# Patient Record
Sex: Female | Born: 2000
Health system: Southern US, Community
[De-identification: ages and names within clinical notes are randomized; demographics above are authoritative.]

## PROBLEM LIST (undated history)

## (undated) DIAGNOSIS — F32A Depression, unspecified: Secondary | ICD-10-CM

## (undated) DIAGNOSIS — F419 Anxiety disorder, unspecified: Secondary | ICD-10-CM

## (undated) DIAGNOSIS — F938 Other childhood emotional disorders: Secondary | ICD-10-CM

## (undated) DIAGNOSIS — R45851 Suicidal ideations: Secondary | ICD-10-CM

## (undated) DIAGNOSIS — F329 Major depressive disorder, single episode, unspecified: Secondary | ICD-10-CM

## (undated) HISTORY — PX: NO PAST SURGERIES: SHX2092

---
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
## 2018-05-09 DIAGNOSIS — F339 Major depressive disorder, recurrent, unspecified: Secondary | ICD-10-CM | POA: Diagnosis not present

## 2018-05-09 DIAGNOSIS — F1020 Alcohol dependence, uncomplicated: Secondary | ICD-10-CM | POA: Diagnosis not present

## 2018-05-12 DIAGNOSIS — M545 Low back pain: Secondary | ICD-10-CM | POA: Diagnosis not present

## 2018-05-12 DIAGNOSIS — M542 Cervicalgia: Secondary | ICD-10-CM | POA: Diagnosis not present

## 2018-05-12 DIAGNOSIS — M62830 Muscle spasm of back: Secondary | ICD-10-CM | POA: Diagnosis not present

## 2018-05-12 DIAGNOSIS — M546 Pain in thoracic spine: Secondary | ICD-10-CM | POA: Diagnosis not present

## 2018-05-15 DIAGNOSIS — M545 Low back pain: Secondary | ICD-10-CM | POA: Diagnosis not present

## 2018-05-15 DIAGNOSIS — M546 Pain in thoracic spine: Secondary | ICD-10-CM | POA: Diagnosis not present

## 2018-05-15 DIAGNOSIS — M542 Cervicalgia: Secondary | ICD-10-CM | POA: Diagnosis not present

## 2018-05-20 DIAGNOSIS — R293 Abnormal posture: Secondary | ICD-10-CM | POA: Diagnosis not present

## 2018-05-20 DIAGNOSIS — M25652 Stiffness of left hip, not elsewhere classified: Secondary | ICD-10-CM | POA: Diagnosis not present

## 2018-05-20 DIAGNOSIS — M62830 Muscle spasm of back: Secondary | ICD-10-CM | POA: Diagnosis not present

## 2018-05-20 DIAGNOSIS — M6281 Muscle weakness (generalized): Secondary | ICD-10-CM | POA: Diagnosis not present

## 2018-05-20 DIAGNOSIS — M25651 Stiffness of right hip, not elsewhere classified: Secondary | ICD-10-CM | POA: Diagnosis not present

## 2018-05-21 DIAGNOSIS — F332 Major depressive disorder, recurrent severe without psychotic features: Secondary | ICD-10-CM | POA: Diagnosis not present

## 2018-05-21 DIAGNOSIS — F411 Generalized anxiety disorder: Secondary | ICD-10-CM | POA: Diagnosis not present

## 2018-05-21 DIAGNOSIS — M6281 Muscle weakness (generalized): Secondary | ICD-10-CM | POA: Diagnosis not present

## 2018-05-21 DIAGNOSIS — M9901 Segmental and somatic dysfunction of cervical region: Secondary | ICD-10-CM | POA: Diagnosis not present

## 2018-05-21 DIAGNOSIS — R293 Abnormal posture: Secondary | ICD-10-CM | POA: Diagnosis not present

## 2018-05-21 DIAGNOSIS — M9903 Segmental and somatic dysfunction of lumbar region: Secondary | ICD-10-CM | POA: Diagnosis not present

## 2018-05-21 DIAGNOSIS — M25652 Stiffness of left hip, not elsewhere classified: Secondary | ICD-10-CM | POA: Diagnosis not present

## 2018-05-21 DIAGNOSIS — F4312 Post-traumatic stress disorder, chronic: Secondary | ICD-10-CM | POA: Diagnosis not present

## 2018-05-21 DIAGNOSIS — M25651 Stiffness of right hip, not elsewhere classified: Secondary | ICD-10-CM | POA: Diagnosis not present

## 2018-05-21 DIAGNOSIS — M9906 Segmental and somatic dysfunction of lower extremity: Secondary | ICD-10-CM | POA: Diagnosis not present

## 2018-05-21 DIAGNOSIS — M9902 Segmental and somatic dysfunction of thoracic region: Secondary | ICD-10-CM | POA: Diagnosis not present

## 2018-05-21 DIAGNOSIS — M9905 Segmental and somatic dysfunction of pelvic region: Secondary | ICD-10-CM | POA: Diagnosis not present

## 2018-05-25 DIAGNOSIS — R293 Abnormal posture: Secondary | ICD-10-CM | POA: Diagnosis not present

## 2018-05-25 DIAGNOSIS — M25651 Stiffness of right hip, not elsewhere classified: Secondary | ICD-10-CM | POA: Diagnosis not present

## 2018-05-25 DIAGNOSIS — M25652 Stiffness of left hip, not elsewhere classified: Secondary | ICD-10-CM | POA: Diagnosis not present

## 2018-05-25 DIAGNOSIS — M6281 Muscle weakness (generalized): Secondary | ICD-10-CM | POA: Diagnosis not present

## 2018-05-27 DIAGNOSIS — F332 Major depressive disorder, recurrent severe without psychotic features: Secondary | ICD-10-CM | POA: Diagnosis not present

## 2018-05-27 DIAGNOSIS — F4312 Post-traumatic stress disorder, chronic: Secondary | ICD-10-CM | POA: Diagnosis not present

## 2018-05-27 DIAGNOSIS — F411 Generalized anxiety disorder: Secondary | ICD-10-CM | POA: Diagnosis not present

## 2018-05-28 DIAGNOSIS — M9906 Segmental and somatic dysfunction of lower extremity: Secondary | ICD-10-CM | POA: Diagnosis not present

## 2018-05-28 DIAGNOSIS — F411 Generalized anxiety disorder: Secondary | ICD-10-CM | POA: Diagnosis not present

## 2018-05-28 DIAGNOSIS — R293 Abnormal posture: Secondary | ICD-10-CM | POA: Diagnosis not present

## 2018-05-28 DIAGNOSIS — M6281 Muscle weakness (generalized): Secondary | ICD-10-CM | POA: Diagnosis not present

## 2018-05-28 DIAGNOSIS — M9901 Segmental and somatic dysfunction of cervical region: Secondary | ICD-10-CM | POA: Diagnosis not present

## 2018-05-28 DIAGNOSIS — F332 Major depressive disorder, recurrent severe without psychotic features: Secondary | ICD-10-CM | POA: Diagnosis not present

## 2018-05-28 DIAGNOSIS — M9903 Segmental and somatic dysfunction of lumbar region: Secondary | ICD-10-CM | POA: Diagnosis not present

## 2018-05-28 DIAGNOSIS — M9902 Segmental and somatic dysfunction of thoracic region: Secondary | ICD-10-CM | POA: Diagnosis not present

## 2018-05-28 DIAGNOSIS — M9905 Segmental and somatic dysfunction of pelvic region: Secondary | ICD-10-CM | POA: Diagnosis not present

## 2018-05-28 DIAGNOSIS — M25652 Stiffness of left hip, not elsewhere classified: Secondary | ICD-10-CM | POA: Diagnosis not present

## 2018-05-28 DIAGNOSIS — M25651 Stiffness of right hip, not elsewhere classified: Secondary | ICD-10-CM | POA: Diagnosis not present

## 2018-05-28 DIAGNOSIS — F4312 Post-traumatic stress disorder, chronic: Secondary | ICD-10-CM | POA: Diagnosis not present

## 2018-06-01 DIAGNOSIS — R293 Abnormal posture: Secondary | ICD-10-CM | POA: Diagnosis not present

## 2018-06-01 DIAGNOSIS — M542 Cervicalgia: Secondary | ICD-10-CM | POA: Diagnosis not present

## 2018-06-01 DIAGNOSIS — M6281 Muscle weakness (generalized): Secondary | ICD-10-CM | POA: Diagnosis not present

## 2018-06-01 DIAGNOSIS — M25651 Stiffness of right hip, not elsewhere classified: Secondary | ICD-10-CM | POA: Diagnosis not present

## 2018-06-01 DIAGNOSIS — M25652 Stiffness of left hip, not elsewhere classified: Secondary | ICD-10-CM | POA: Diagnosis not present

## 2018-06-03 DIAGNOSIS — F4312 Post-traumatic stress disorder, chronic: Secondary | ICD-10-CM | POA: Diagnosis not present

## 2018-06-03 DIAGNOSIS — F332 Major depressive disorder, recurrent severe without psychotic features: Secondary | ICD-10-CM | POA: Diagnosis not present

## 2018-06-03 DIAGNOSIS — F411 Generalized anxiety disorder: Secondary | ICD-10-CM | POA: Diagnosis not present

## 2018-06-04 DIAGNOSIS — F4312 Post-traumatic stress disorder, chronic: Secondary | ICD-10-CM | POA: Diagnosis not present

## 2018-06-04 DIAGNOSIS — F332 Major depressive disorder, recurrent severe without psychotic features: Secondary | ICD-10-CM | POA: Diagnosis not present

## 2018-06-04 DIAGNOSIS — F411 Generalized anxiety disorder: Secondary | ICD-10-CM | POA: Diagnosis not present

## 2018-06-08 DIAGNOSIS — M25651 Stiffness of right hip, not elsewhere classified: Secondary | ICD-10-CM | POA: Diagnosis not present

## 2018-06-08 DIAGNOSIS — M6281 Muscle weakness (generalized): Secondary | ICD-10-CM | POA: Diagnosis not present

## 2018-06-08 DIAGNOSIS — R293 Abnormal posture: Secondary | ICD-10-CM | POA: Diagnosis not present

## 2018-06-08 DIAGNOSIS — M542 Cervicalgia: Secondary | ICD-10-CM | POA: Diagnosis not present

## 2018-06-08 DIAGNOSIS — M25652 Stiffness of left hip, not elsewhere classified: Secondary | ICD-10-CM | POA: Diagnosis not present

## 2018-06-09 DIAGNOSIS — F4312 Post-traumatic stress disorder, chronic: Secondary | ICD-10-CM | POA: Diagnosis not present

## 2018-06-09 DIAGNOSIS — F411 Generalized anxiety disorder: Secondary | ICD-10-CM | POA: Diagnosis not present

## 2018-06-09 DIAGNOSIS — F332 Major depressive disorder, recurrent severe without psychotic features: Secondary | ICD-10-CM | POA: Diagnosis not present

## 2018-06-10 DIAGNOSIS — F411 Generalized anxiety disorder: Secondary | ICD-10-CM | POA: Diagnosis not present

## 2018-06-10 DIAGNOSIS — F332 Major depressive disorder, recurrent severe without psychotic features: Secondary | ICD-10-CM | POA: Diagnosis not present

## 2018-06-10 DIAGNOSIS — F4312 Post-traumatic stress disorder, chronic: Secondary | ICD-10-CM | POA: Diagnosis not present

## 2018-06-11 DIAGNOSIS — M25651 Stiffness of right hip, not elsewhere classified: Secondary | ICD-10-CM | POA: Diagnosis not present

## 2018-06-11 DIAGNOSIS — M9905 Segmental and somatic dysfunction of pelvic region: Secondary | ICD-10-CM | POA: Diagnosis not present

## 2018-06-11 DIAGNOSIS — M9906 Segmental and somatic dysfunction of lower extremity: Secondary | ICD-10-CM | POA: Diagnosis not present

## 2018-06-11 DIAGNOSIS — M9903 Segmental and somatic dysfunction of lumbar region: Secondary | ICD-10-CM | POA: Diagnosis not present

## 2018-06-11 DIAGNOSIS — M9902 Segmental and somatic dysfunction of thoracic region: Secondary | ICD-10-CM | POA: Diagnosis not present

## 2018-06-11 DIAGNOSIS — M25652 Stiffness of left hip, not elsewhere classified: Secondary | ICD-10-CM | POA: Diagnosis not present

## 2018-06-11 DIAGNOSIS — M9901 Segmental and somatic dysfunction of cervical region: Secondary | ICD-10-CM | POA: Diagnosis not present

## 2018-06-17 DIAGNOSIS — F332 Major depressive disorder, recurrent severe without psychotic features: Secondary | ICD-10-CM | POA: Diagnosis not present

## 2018-06-17 DIAGNOSIS — F4312 Post-traumatic stress disorder, chronic: Secondary | ICD-10-CM | POA: Diagnosis not present

## 2018-06-17 DIAGNOSIS — F411 Generalized anxiety disorder: Secondary | ICD-10-CM | POA: Diagnosis not present

## 2018-06-18 DIAGNOSIS — F411 Generalized anxiety disorder: Secondary | ICD-10-CM | POA: Diagnosis not present

## 2018-06-18 DIAGNOSIS — F332 Major depressive disorder, recurrent severe without psychotic features: Secondary | ICD-10-CM | POA: Diagnosis not present

## 2018-06-18 DIAGNOSIS — F4312 Post-traumatic stress disorder, chronic: Secondary | ICD-10-CM | POA: Diagnosis not present

## 2018-06-22 DIAGNOSIS — F332 Major depressive disorder, recurrent severe without psychotic features: Secondary | ICD-10-CM | POA: Diagnosis not present

## 2018-06-22 DIAGNOSIS — F4312 Post-traumatic stress disorder, chronic: Secondary | ICD-10-CM | POA: Diagnosis not present

## 2018-06-22 DIAGNOSIS — F411 Generalized anxiety disorder: Secondary | ICD-10-CM | POA: Diagnosis not present

## 2018-06-23 DIAGNOSIS — F4312 Post-traumatic stress disorder, chronic: Secondary | ICD-10-CM | POA: Diagnosis not present

## 2018-06-23 DIAGNOSIS — F411 Generalized anxiety disorder: Secondary | ICD-10-CM | POA: Diagnosis not present

## 2018-06-23 DIAGNOSIS — F332 Major depressive disorder, recurrent severe without psychotic features: Secondary | ICD-10-CM | POA: Diagnosis not present

## 2018-06-24 DIAGNOSIS — F411 Generalized anxiety disorder: Secondary | ICD-10-CM | POA: Diagnosis not present

## 2018-06-24 DIAGNOSIS — F332 Major depressive disorder, recurrent severe without psychotic features: Secondary | ICD-10-CM | POA: Diagnosis not present

## 2018-06-24 DIAGNOSIS — F4312 Post-traumatic stress disorder, chronic: Secondary | ICD-10-CM | POA: Diagnosis not present

## 2018-06-25 DIAGNOSIS — M542 Cervicalgia: Secondary | ICD-10-CM | POA: Diagnosis not present

## 2018-06-25 DIAGNOSIS — M25512 Pain in left shoulder: Secondary | ICD-10-CM | POA: Diagnosis not present

## 2018-06-25 DIAGNOSIS — M25511 Pain in right shoulder: Secondary | ICD-10-CM | POA: Diagnosis not present

## 2018-06-30 DIAGNOSIS — F332 Major depressive disorder, recurrent severe without psychotic features: Secondary | ICD-10-CM | POA: Diagnosis not present

## 2018-06-30 DIAGNOSIS — F411 Generalized anxiety disorder: Secondary | ICD-10-CM | POA: Diagnosis not present

## 2018-06-30 DIAGNOSIS — F4312 Post-traumatic stress disorder, chronic: Secondary | ICD-10-CM | POA: Diagnosis not present

## 2018-07-01 DIAGNOSIS — M25512 Pain in left shoulder: Secondary | ICD-10-CM | POA: Diagnosis not present

## 2018-07-01 DIAGNOSIS — M542 Cervicalgia: Secondary | ICD-10-CM | POA: Diagnosis not present

## 2018-07-01 DIAGNOSIS — M25511 Pain in right shoulder: Secondary | ICD-10-CM | POA: Diagnosis not present

## 2018-07-02 DIAGNOSIS — F332 Major depressive disorder, recurrent severe without psychotic features: Secondary | ICD-10-CM | POA: Diagnosis not present

## 2018-07-02 DIAGNOSIS — F4312 Post-traumatic stress disorder, chronic: Secondary | ICD-10-CM | POA: Diagnosis not present

## 2018-07-02 DIAGNOSIS — F411 Generalized anxiety disorder: Secondary | ICD-10-CM | POA: Diagnosis not present

## 2018-08-27 DIAGNOSIS — Z20828 Contact with and (suspected) exposure to other viral communicable diseases: Secondary | ICD-10-CM | POA: Diagnosis not present

## 2018-10-22 ENCOUNTER — Telehealth: Payer: Self-pay

## 2018-10-22 NOTE — Telephone Encounter (Signed)

## 2018-10-23 ENCOUNTER — Other Ambulatory Visit: Payer: Self-pay

## 2018-10-23 ENCOUNTER — Encounter: Payer: Self-pay | Admitting: Family Medicine

## 2018-10-23 ENCOUNTER — Ambulatory Visit (INDEPENDENT_AMBULATORY_CARE_PROVIDER_SITE_OTHER): Payer: BC Managed Care – PPO | Admitting: Family Medicine

## 2018-10-23 VITALS — BP 112/60 | HR 115 | Temp 98.4°F | Ht 66.5 in | Wt 110.4 lb

## 2018-10-23 DIAGNOSIS — Z23 Encounter for immunization: Secondary | ICD-10-CM

## 2018-10-23 DIAGNOSIS — Z0000 Encounter for general adult medical examination without abnormal findings: Secondary | ICD-10-CM

## 2018-10-23 NOTE — Progress Notes (Signed)
Molly Callahan is a 18 y.o. female  Chief Complaint  Patient presents with  . New Patient (Initial Visit)    Pt is here today to establish Care. She is not currently fasting.  . Annual Exam    Pt is requesting a CPE. According to [facility] she is due for her Menveo#2, HPV#2, and Flu vaccines which she agrees to receive.    HPI: Molly Callahan is a 18 y.o. female here as a new patient to our office for her annual CPE. Pt is not fasting. She would like to get flu vaccine, as well as HPV #2, and menveo #2 vaccines today. Pt works as a Educational psychologist.  Pt has a PMHx significant for anxiety, depression for which she takes wellbutrin and lexapro. Thomasene Lot at [facility] prescribes pts meds and sees her every 2 mo.   Diet/Exercise: overall healthy, some exercise Dental: UTD Vision: does not wear glasses or contacts and no concerns about vision  Med refills needed today? no   Past Medical History:  Diagnosis Date  . Anxiety   . 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 Depression   . Suicidal ideation 03/19/2016    Past Surgical History:  Procedure Laterality Date  . NO PAST SURGERIES      Social History   Socioeconomic History  . Marital status: Single    Spouse name: Not on file  . Number of children: Not on file  . Years of education: Not on file  . Highest education level: Not on file  Occupational History  . Not on file  Social Needs  . Financial resource strain: Not on file  . Food insecurity    Worry: Not on file    Inability: Not on file  . Transportation needs    Medical: Not on file    Non-medical: Not on file  Tobacco Use  . Smoking status: Never Smoker  . Smokeless tobacco: Never Used  Substance and Sexual Activity  . Alcohol use: No  . Drug use: No  . Sexual activity: Yes    Birth control/protection: Condom  Lifestyle  . Physical activity    Days per week: Not on file    Minutes per session: Not on file  . Stress: Not on file   Relationships  . Social Herbalist on phone: Not on file    Gets together: Not on file    Attends religious service: Not on file    Active member of club or organization: Not on file    Attends meetings of clubs or organizations: Not on file    Relationship status: Not on file  . Intimate partner violence    Fear of current or ex partner: Not on file    Emotionally abused: Not on file    Physically abused: Not on file    Forced sexual activity: Not on file  Other Topics Concern  . Not on file  Social History Narrative  . Not on file    History reviewed. No pertinent family history.   Immunization History  Administered Date(s) Administered  . DTaP 02/13/1999, 05/01/1999, 08/29/1999, 08/08/2000  . Hepatitis A 01/11/2009, 06/08/2010  . Hepatitis B 02/13/1999, 05/01/1999, 08/29/1999  . HiB (PRP-OMP) 02/13/1999, 05/01/1999, 08/29/1999, 08/08/2000  . Hpv 04/20/2014  . IPV 02/13/1999, 05/01/1999, 03/14/2000, 05/18/2004  . Influenza Nasal 01/11/2008, 01/03/2009, 11/14/2012  . Influenza,inj,Quad PF,6+ Mos 10/23/2016  . Influenza-Unspecified 03/07/2016  . MMR 03/14/2000, 05/18/2004  . Meningococcal Conjugate 06/08/2010  .  Pneumococcal Conjugate-13 08/08/2000  . Tdap 06/08/2010  . Varicella 08/08/2000, 01/11/2009    Outpatient Encounter Medications as of 10/23/2018  Medication Sig  . buPROPion (WELLBUTRIN XL) 300 MG 24 hr tablet Take 300 mg by mouth daily.  Marland Kitchen escitalopram (LEXAPRO) 20 MG tablet Take 1 tablet (20 mg total) by mouth at bedtime.  Marland Kitchen ibuprofen (ADVIL,MOTRIN) 200 MG tablet Take 400 mg by mouth every 6 (six) hours as needed for headache.  Marland Kitchen MELATONIN PO Take 6 mg by mouth at bedtime as needed.  . [DISCONTINUED] buPROPion (WELLBUTRIN XL) 150 MG 24 hr tablet Take 150 mg by mouth daily.  . [DISCONTINUED] escitalopram (LEXAPRO) 10 MG tablet Take 10 mg by mouth daily.  . [DISCONTINUED] nitrofurantoin, macrocrystal-monohydrate, (MACROBID) 100 MG capsule Take 1  capsule (100 mg total) by mouth every 12 (twelve) hours. (Patient not taking: Reported on 06/10/2017)   No facility-administered encounter medications on file as of 10/23/2018.      ROS: Gen: no fever, chills  Skin: no rash, itching ENT: no ear pain, ear drainage, nasal congestion, rhinorrhea, sinus pressure, sore throat Eyes: no blurry vision, double vision Resp: no cough, wheeze,SOB Breast: no breast tenderness, no nipple discharge, no breast masses CV: no CP, palpitations, LE edema,  GI: no heartburn, n/v/d/c, abd pain GU: no dysuria, urgency, frequency, hematuria; no vaginal itching, odor, discharge MSK: no joint pain, myalgias, back pain Neuro: no dizziness, headache, weakness, vertigo Psych: + depression, anxiety, some insomnia   Allergies  Allergen Reactions  . Pollen Extract Other (See Comments)    Teary eyes    BP 112/60 (BP Location: Right Arm, Patient Position: Sitting, Cuff Size: Normal)   Pulse (!) 115   Temp 98.4 F (36.9 C) (Oral)   Ht 5' 6.5" (1.689 m)   Wt 110 lb 6.4 oz (50.1 kg)   LMP 10/16/2018   SpO2 98%   BMI 17.55 kg/m   Physical Exam  Constitutional: She is oriented to person, place, and time. She appears well-developed and well-nourished. No distress.  HENT:  Head: Normocephalic and atraumatic.  Right Ear: Tympanic membrane and ear canal normal.  Left Ear: Tympanic membrane and ear canal normal.  Nose: Nose normal.  Mouth/Throat: Oropharynx is clear and moist and mucous membranes are normal.  Eyes: Pupils are equal, round, and reactive to light. Conjunctivae are normal.  Neck: Neck supple. No thyromegaly present.  Cardiovascular: Normal rate, regular rhythm, normal heart sounds and intact distal pulses.  No murmur heard. Pulmonary/Chest: Effort normal and breath sounds normal. No respiratory distress. She has no wheezes. She has no rhonchi.  Abdominal: Soft. Bowel sounds are normal. She exhibits no distension and no mass. There is no abdominal  tenderness.  Musculoskeletal:        General: No edema.  Lymphadenopathy:    She has no cervical adenopathy.  Neurological: She is alert and oriented to person, place, and time. She exhibits normal muscle tone. Coordination normal.  Skin: Skin is warm and dry.  Psychiatric: She has a normal mood and affect. Her behavior is normal.     A/P:  1. Annual physical exam - immunizations today and then UTD (see below) - discussed importance of regular CV exercise, healthy diet, adequate sleep, avoidance of tobacco, alcohol, and illicit drugs - UTD on dental exam, pt does not wear glasses or contact and does not have any concerns about her vision - PAP and routine labs at 19yo - next CPE in 1 year  2. Need for influenza vaccination -  Flu Vaccine QUAD 6+ mos PF IM (Fluarix Quad PF)  3. Need for meningitis vaccination - Meningococcal MCV4O(Menveo)  4. Need for HPV vaccine - HPV 9-valent vaccine,Recombinat - will RTO for nurse visit in 12 wks for #3

## 2018-10-23 NOTE — Patient Instructions (Signed)
Preventive Care 18-21 Years Old, Female Preventive care refers to lifestyle choices and visits with your health care provider that can promote health and wellness. At this stage in your life, you may start seeing a primary care physician instead of a pediatrician. Your health care is now your responsibility. Preventive care for young adults includes:  A yearly physical exam. This is also called an annual wellness visit.  Regular dental and eye exams.  Immunizations.  Screening for certain conditions.  Healthy lifestyle choices, such as diet and exercise. What can I expect for my preventive care visit? Physical exam Your health care provider may check:  Height and weight. These may be used to calculate body mass index (BMI), which is a measurement that tells if you are at a healthy weight.  Heart rate and blood pressure.  Body temperature. Counseling Your health care provider may ask you questions about:  Past medical problems and family medical history.  Alcohol, tobacco, and drug use.  Home and relationship well-being.  Access to firearms.  Emotional well-being.  Diet, exercise, and sleep habits.  Sexual activity and sexual health.  Method of birth control.  Menstrual cycle.  Pregnancy history. What immunizations do I need?  Influenza (flu) vaccine  This is recommended every year. Tetanus, diphtheria, and pertussis (Tdap) vaccine  You may need a Td booster every 10 years. Varicella (chickenpox) vaccine  You may need this vaccine if you have not already been vaccinated. Human papillomavirus (HPV) vaccine  If recommended by your health care provider, you may need three doses over 6 months. Measles, mumps, and rubella (MMR) vaccine  You may need at least one dose of MMR. You may also need a second dose. Meningococcal conjugate (MenACWY) vaccine  One dose is recommended if you are 19-21 years old and a first-year college student living in a residence hall,  or if you have one of several medical conditions. You may also need additional booster doses. Pneumococcal conjugate (PCV13) vaccine  You may need this if you have certain conditions and were not previously vaccinated. Pneumococcal polysaccharide (PPSV23) vaccine  You may need one or two doses if you smoke cigarettes or if you have certain conditions. Hepatitis A vaccine  You may need this if you have certain conditions or if you travel or work in places where you may be exposed to hepatitis A. Hepatitis B vaccine  You may need this if you have certain conditions or if you travel or work in places where you may be exposed to hepatitis B. Haemophilus influenzae type b (Hib) vaccine  You may need this if you have certain risk factors. You may receive vaccines as individual doses or as more than one vaccine together in one shot (combination vaccines). Talk with your health care provider about the risks and benefits of combination vaccines. What tests do I need? Blood tests  Lipid and cholesterol levels. These may be checked every 5 years starting at age 20.  Hepatitis C test.  Hepatitis B test. Screening  Pelvic exam and Pap test. This may be done every 3 years starting at age 21.  Sexually transmitted disease (STD) testing, if you are at risk.  BRCA-related cancer screening. This may be done if you have a family history of breast, ovarian, tubal, or peritoneal cancers. Other tests  Tuberculosis skin test.  Vision and hearing tests.  Skin exam.  Breast exam. Follow these instructions at home: Eating and drinking   Eat a diet that includes fresh fruits and   vegetables, whole grains, lean protein, and low-fat dairy products.  Drink enough fluid to keep your urine pale yellow.  Do not drink alcohol if: ? Your health care provider tells you not to drink. ? You are pregnant, may be pregnant, or are planning to become pregnant. ? You are under the legal drinking age. In the  [place] the legal drinking age is 21.  If you drink alcohol: ? Limit how much you have to 0-1 drink a day. ? Be aware of how much alcohol is in your drink. In the [place] one drink equals one 12 oz bottle of beer (355 mL), one 5 oz glass of wine (148 mL), or one 1 oz glass of hard liquor (44 mL). Lifestyle  Take daily care of your teeth and gums.  Stay active. Exercise at least 30 minutes 5 or more days of the week.  Do not use any products that contain nicotine or tobacco, such as cigarettes, e-cigarettes, and chewing tobacco. If you need help quitting, ask your health care provider.  Do not use drugs.  If you are sexually active, practice safe sex. Use a condom or other form of birth control (contraception) in order to prevent pregnancy and STIs (sexually transmitted infections). If you plan to become pregnant, see your health care provider for a pre-conception visit.  Find healthy ways to cope with stress, such as: ? Meditation, yoga, or listening to music. ? Journaling. ? Talking to a trusted person. ? Spending time with friends and family. Safety  Always wear your seat belt while driving or riding in a vehicle.  Do not drive if you have been drinking alcohol. Do not ride with someone who has been drinking.  Do not drive when you are tired or distracted. Do not text while driving.  Wear a helmet and other protective equipment during sports activities.  If you have firearms in your house, make sure you follow all gun safety procedures.  Seek help if you have been bullied, physically abused, or sexually abused.  Use the Internet responsibly to avoid dangers such as online bullying and online sex predators. What's next?  Go to your health care provider once a year for a well check visit.  Ask your health care provider how often you should have your eyes and teeth checked.  Stay up to date on all vaccines. This information is not intended to replace advice given to you by  your health care provider. Make sure you discuss any questions you have with your health care provider. Document Released: 05/25/2015 Document Revised: 01/01/2018 Document Reviewed: 01/01/2018 Elsevier Patient Education  2020 [employer] Inc[employer]  

## 2018-12-11 DIAGNOSIS — F331 Major depressive disorder, recurrent, moderate: Secondary | ICD-10-CM | POA: Diagnosis not present

## 2018-12-11 DIAGNOSIS — F419 Anxiety disorder, unspecified: Secondary | ICD-10-CM | POA: Diagnosis not present

## 2018-12-11 DIAGNOSIS — F332 Major depressive disorder, recurrent severe without psychotic features: Secondary | ICD-10-CM | POA: Diagnosis not present

## 2018-12-24 ENCOUNTER — Ambulatory Visit (INDEPENDENT_AMBULATORY_CARE_PROVIDER_SITE_OTHER): Payer: BC Managed Care – PPO | Admitting: Family Medicine

## 2018-12-24 ENCOUNTER — Encounter: Payer: Self-pay | Admitting: Family Medicine

## 2018-12-24 ENCOUNTER — Other Ambulatory Visit: Payer: Self-pay

## 2018-12-24 DIAGNOSIS — F332 Major depressive disorder, recurrent severe without psychotic features: Secondary | ICD-10-CM | POA: Diagnosis not present

## 2018-12-24 DIAGNOSIS — F419 Anxiety disorder, unspecified: Secondary | ICD-10-CM | POA: Diagnosis not present

## 2018-12-24 MED ORDER — ESCITALOPRAM OXALATE 20 MG PO TABS: 20.0000 mg | ORAL_TABLET | Freq: Every day | ORAL | 3 refills | Status: DC

## 2018-12-24 MED ORDER — BUPROPION HCL ER (XL) 300 MG PO TB24: 300.0000 mg | ORAL_TABLET | Freq: Every day | ORAL | 3 refills | Status: DC

## 2018-12-24 NOTE — Progress Notes (Signed)
Virtual Visit via Video Note  I connected with Molly Callahan on 12/24/18 at 10:30 AM EST by a video enabled telemedicine application and verified that I am speaking with the correct person using two identifiers. Location patient: home Location provider: work  Persons participating in the virtual visit: patient, provider  I discussed the limitations of evaluation and management by telemedicine and the availability of in person appointments. The patient expressed understanding and agreed to proceed.  Chief Complaint  Patient presents with  . Medication Refill     HPI: Molly Callahan is a 18 y.o. female who is requesting refills of her meds - lexapro 20mg  daily and wellbutrin XL 300mg  daily. She has been on these meds x 5 years. She feels they are effective. Denies side effects. She feels good overall. No issues on concerns.   Depression screen [facility] 2/9 12/24/2018 10/23/2018  Decreased Interest 0 1  Down, Depressed, Hopeless 1 1  PHQ - 2 Score 1 2  Altered sleeping - 0  Tired, decreased energy - 1  Change in appetite - 1  Feeling bad or failure about yourself  - 1  Trouble concentrating - 1  Moving slowly or fidgety/restless - 0  Suicidal thoughts - 0  PHQ-9 Score - 6  Difficult doing work/chores - Somewhat difficult   GAD 7 : Generalized Anxiety Score 12/24/2018 10/23/2018  Nervous, Anxious, on Edge 0 0  Control/stop worrying 0 0  Worry too much - different things 0 0  Trouble relaxing 1 1  Restless 0 0  Easily annoyed or irritable 1 1  Afraid - awful might happen 0 0  Total GAD 7 Score 2 2  Anxiety Difficulty - Not difficult at all      Past Medical History:  Diagnosis Date  . Anxiety   . 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 Depression   . Suicidal ideation 03/19/2016    Past Surgical History:  Procedure Laterality Date  . NO PAST SURGERIES      No family history on file.  Social History   Tobacco Use  . Smoking status: Never Smoker  . Smokeless  tobacco: Never Used  Substance Use Topics  . Alcohol use: No  . Drug use: No     Current Outpatient Medications:  .  buPROPion (WELLBUTRIN XL) 300 MG 24 hr tablet, Take 1 tablet (300 mg total) by mouth daily., Disp: 90 tablet, Rfl: 3 .  escitalopram (LEXAPRO) 20 MG tablet, Take 1 tablet (20 mg total) by mouth at bedtime., Disp: 90 tablet, Rfl: 3 .  ibuprofen (ADVIL,MOTRIN) 200 MG tablet, Take 400 mg by mouth every 6 (six) hours as needed for headache., Disp: , Rfl:  .  MELATONIN PO, Take 6 mg by mouth at bedtime as needed., Disp: , Rfl:   Allergies  Allergen Reactions  . Pollen Extract Other (See Comments)    Teary eyes      ROS: See pertinent positives and negatives per HPI.   EXAM:  VITALS per patient if applicable: LMP 12/08/2018    GENERAL: alert, oriented, appears well and in no acute distress  HEENT: atraumatic, conjunctiva clear, no obvious abnormalities on inspection of external nose and ears  NECK: normal movements of the head and neck  LUNGS: on inspection no signs of respiratory distress, breathing rate appears normal, no obvious gross SOB, gasping or wheezing, no conversational dyspnea  CV: no obvious cyanosis  PSYCH/NEURO: pleasant and cooperative, no obvious depression or anxiety, speech and thought processing  grossly intact   ASSESSMENT AND PLAN: 1. Major depressive disorder, recurrent severe without psychotic features ([place]) 2. Anxiety - chronic, stable, well-controlled - PHQ-9 = 2, GAD-7 = 2 Refill: - buPROPion (WELLBUTRIN XL) 300 MG 24 hr tablet - escitalopram (LEXAPRO) 20 MG tablet - f/u in 6-12 mo or sooner PRN   I discussed the assessment and treatment plan with the patient. The patient was provided an opportunity to ask questions and all were answered. The patient agreed with the plan and demonstrated an understanding of the instructions.   The patient was advised to call back or seek an in-person evaluation if the symptoms worsen or if the  condition fails to improve as anticipated.   Letta Median, DO

## 2019-01-26 ENCOUNTER — Ambulatory Visit: Payer: BC Managed Care – PPO

## 2019-02-17 ENCOUNTER — Inpatient Hospital Stay (HOSPITAL_COMMUNITY)
Admission: EM | Admit: 2019-02-17 | Discharge: 2019-02-26 | DRG: 917 | Disposition: A | Discharge status: Other Acute Inpatient Hospital | Payer: BC Managed Care – PPO | Attending: Internal Medicine | Admitting: Internal Medicine

## 2019-02-17 ENCOUNTER — Other Ambulatory Visit: Payer: Self-pay

## 2019-02-17 ENCOUNTER — Emergency Department (HOSPITAL_COMMUNITY): Payer: BC Managed Care – PPO

## 2019-02-17 DIAGNOSIS — T43292A Poisoning by other antidepressants, intentional self-harm, initial encounter: Principal | ICD-10-CM | POA: Diagnosis present

## 2019-02-17 DIAGNOSIS — I472 Ventricular tachycardia: Secondary | ICD-10-CM | POA: Diagnosis not present

## 2019-02-17 DIAGNOSIS — E874 Mixed disorder of acid-base balance: Secondary | ICD-10-CM | POA: Diagnosis not present

## 2019-02-17 DIAGNOSIS — I471 Supraventricular tachycardia: Secondary | ICD-10-CM | POA: Diagnosis not present

## 2019-02-17 DIAGNOSIS — F419 Anxiety disorder, unspecified: Secondary | ICD-10-CM | POA: Diagnosis not present

## 2019-02-17 DIAGNOSIS — G253 Myoclonus: Secondary | ICD-10-CM | POA: Diagnosis not present

## 2019-02-17 DIAGNOSIS — R069 Unspecified abnormalities of breathing: Secondary | ICD-10-CM

## 2019-02-17 DIAGNOSIS — R404 Transient alteration of awareness: Secondary | ICD-10-CM | POA: Diagnosis not present

## 2019-02-17 DIAGNOSIS — Z978 Presence of other specified devices: Secondary | ICD-10-CM

## 2019-02-17 DIAGNOSIS — Z4682 Encounter for fitting and adjustment of non-vascular catheter: Secondary | ICD-10-CM | POA: Diagnosis not present

## 2019-02-17 DIAGNOSIS — T50902A Poisoning by unspecified drugs, medicaments and biological substances, intentional self-harm, initial encounter: Secondary | ICD-10-CM | POA: Diagnosis not present

## 2019-02-17 DIAGNOSIS — I4581 Long QT syndrome: Secondary | ICD-10-CM | POA: Diagnosis present

## 2019-02-17 DIAGNOSIS — T43222A Poisoning by selective serotonin reuptake inhibitors, intentional self-harm, initial encounter: Secondary | ICD-10-CM | POA: Diagnosis not present

## 2019-02-17 DIAGNOSIS — F515 Nightmare disorder: Secondary | ICD-10-CM | POA: Diagnosis not present

## 2019-02-17 DIAGNOSIS — I468 Cardiac arrest due to other underlying condition: Secondary | ICD-10-CM | POA: Diagnosis not present

## 2019-02-17 DIAGNOSIS — F4310 Post-traumatic stress disorder, unspecified: Secondary | ICD-10-CM | POA: Diagnosis present

## 2019-02-17 DIAGNOSIS — T50901A Poisoning by unspecified drugs, medicaments and biological substances, accidental (unintentional), initial encounter: Secondary | ICD-10-CM | POA: Diagnosis present

## 2019-02-17 DIAGNOSIS — Z781 Physical restraint status: Secondary | ICD-10-CM

## 2019-02-17 DIAGNOSIS — R9431 Abnormal electrocardiogram [ECG] [EKG]: Secondary | ICD-10-CM | POA: Diagnosis not present

## 2019-02-17 DIAGNOSIS — T1491XA Suicide attempt, initial encounter: Secondary | ICD-10-CM

## 2019-02-17 DIAGNOSIS — Z20822 Contact with and (suspected) exposure to covid-19: Secondary | ICD-10-CM | POA: Diagnosis not present

## 2019-02-17 DIAGNOSIS — J9811 Atelectasis: Secondary | ICD-10-CM | POA: Diagnosis not present

## 2019-02-17 DIAGNOSIS — Z452 Encounter for adjustment and management of vascular access device: Secondary | ICD-10-CM | POA: Diagnosis not present

## 2019-02-17 DIAGNOSIS — T50902D Poisoning by unspecified drugs, medicaments and biological substances, intentional self-harm, subsequent encounter: Secondary | ICD-10-CM | POA: Diagnosis not present

## 2019-02-17 DIAGNOSIS — I462 Cardiac arrest due to underlying cardiac condition: Secondary | ICD-10-CM | POA: Diagnosis not present

## 2019-02-17 DIAGNOSIS — G40409 Other generalized epilepsy and epileptic syndromes, not intractable, without status epilepticus: Secondary | ICD-10-CM | POA: Diagnosis not present

## 2019-02-17 DIAGNOSIS — Z79899 Other long term (current) drug therapy: Secondary | ICD-10-CM | POA: Diagnosis not present

## 2019-02-17 DIAGNOSIS — R52 Pain, unspecified: Secondary | ICD-10-CM | POA: Diagnosis not present

## 2019-02-17 DIAGNOSIS — Z6820 Body mass index (BMI) 20.0-20.9, adult: Secondary | ICD-10-CM

## 2019-02-17 DIAGNOSIS — R918 Other nonspecific abnormal finding of lung field: Secondary | ICD-10-CM | POA: Diagnosis not present

## 2019-02-17 DIAGNOSIS — E876 Hypokalemia: Secondary | ICD-10-CM | POA: Diagnosis not present

## 2019-02-17 DIAGNOSIS — E440 Moderate protein-calorie malnutrition: Secondary | ICD-10-CM | POA: Diagnosis not present

## 2019-02-17 DIAGNOSIS — G92 Toxic encephalopathy: Secondary | ICD-10-CM | POA: Diagnosis not present

## 2019-02-17 DIAGNOSIS — Z915 Personal history of self-harm: Secondary | ICD-10-CM

## 2019-02-17 DIAGNOSIS — F17210 Nicotine dependence, cigarettes, uncomplicated: Secondary | ICD-10-CM | POA: Diagnosis not present

## 2019-02-17 DIAGNOSIS — Z9911 Dependence on respirator [ventilator] status: Secondary | ICD-10-CM

## 2019-02-17 DIAGNOSIS — R519 Headache, unspecified: Secondary | ICD-10-CM | POA: Diagnosis not present

## 2019-02-17 DIAGNOSIS — N179 Acute kidney failure, unspecified: Secondary | ICD-10-CM | POA: Diagnosis present

## 2019-02-17 DIAGNOSIS — G9340 Encephalopathy, unspecified: Secondary | ICD-10-CM

## 2019-02-17 DIAGNOSIS — J690 Pneumonitis due to inhalation of food and vomit: Secondary | ICD-10-CM | POA: Diagnosis not present

## 2019-02-17 DIAGNOSIS — J9600 Acute respiratory failure, unspecified whether with hypoxia or hypercapnia: Secondary | ICD-10-CM | POA: Diagnosis not present

## 2019-02-17 DIAGNOSIS — R45851 Suicidal ideations: Secondary | ICD-10-CM | POA: Diagnosis not present

## 2019-02-17 DIAGNOSIS — R42 Dizziness and giddiness: Secondary | ICD-10-CM | POA: Diagnosis not present

## 2019-02-17 DIAGNOSIS — I959 Hypotension, unspecified: Secondary | ICD-10-CM | POA: Diagnosis not present

## 2019-02-17 DIAGNOSIS — R569 Unspecified convulsions: Secondary | ICD-10-CM | POA: Diagnosis present

## 2019-02-17 DIAGNOSIS — G40901 Epilepsy, unspecified, not intractable, with status epilepticus: Secondary | ICD-10-CM | POA: Diagnosis not present

## 2019-02-17 DIAGNOSIS — J9601 Acute respiratory failure with hypoxia: Secondary | ICD-10-CM | POA: Diagnosis not present

## 2019-02-17 DIAGNOSIS — R031 Nonspecific low blood-pressure reading: Secondary | ICD-10-CM | POA: Diagnosis not present

## 2019-02-17 DIAGNOSIS — F332 Major depressive disorder, recurrent severe without psychotic features: Secondary | ICD-10-CM | POA: Diagnosis not present

## 2019-02-17 DIAGNOSIS — D6489 Other specified anemias: Secondary | ICD-10-CM | POA: Diagnosis not present

## 2019-02-17 DIAGNOSIS — G2579 Other drug induced movement disorders: Secondary | ICD-10-CM | POA: Diagnosis not present

## 2019-02-17 DIAGNOSIS — U071 COVID-19: Secondary | ICD-10-CM | POA: Diagnosis not present

## 2019-02-17 LAB — CBC WITH DIFFERENTIAL/PLATELET
Abs Immature Granulocytes: 0.04 10*3/uL (ref 0.00–0.07)
Basophils Absolute: 0.1 10*3/uL (ref 0.0–0.1)
Basophils Relative: 1 %
Eosinophils Absolute: 0.2 10*3/uL (ref 0.0–0.5)
Eosinophils Relative: 2 %
HCT: 38.8 % (ref 36.0–46.0)
Hemoglobin: 12.7 g/dL (ref 12.0–15.0)
Immature Granulocytes: 0 %
Lymphocytes Relative: 21 %
Lymphs Abs: 1.9 10*3/uL (ref 0.7–4.0)
MCH: 29.5 pg (ref 26.0–34.0)
MCHC: 32.7 g/dL (ref 30.0–36.0)
MCV: 90 fL (ref 80.0–100.0)
Monocytes Absolute: 0.7 10*3/uL (ref 0.1–1.0)
Monocytes Relative: 7 %
Neutro Abs: 6.4 10*3/uL (ref 1.7–7.7)
Neutrophils Relative %: 69 %
Platelets: 229 10*3/uL (ref 150–400)
RBC: 4.31 MIL/uL (ref 3.87–5.11)
RDW: 12.8 % (ref 11.5–15.5)
WBC: 9.2 10*3/uL (ref 4.0–10.5)
nRBC: 0 % (ref 0.0–0.2)

## 2019-02-17 LAB — COMPREHENSIVE METABOLIC PANEL
ALT: 14 U/L (ref 0–44)
AST: 17 U/L (ref 15–41)
Albumin: 4.1 g/dL (ref 3.5–5.0)
Alkaline Phosphatase: 47 U/L (ref 38–126)
Anion gap: 14 (ref 5–15)
BUN: 7 mg/dL (ref 6–20)
CO2: 18 mmol/L — ABNORMAL LOW (ref 22–32)
Calcium: 8.5 mg/dL — ABNORMAL LOW (ref 8.9–10.3)
Chloride: 109 mmol/L (ref 98–111)
Creatinine, Ser: 0.83 mg/dL (ref 0.44–1.00)
GFR calc Af Amer: >60 mL/min (ref >60)
GFR calc non Af Amer: >60 mL/min (ref >60)
Glucose, Bld: 101 mg/dL — ABNORMAL HIGH (ref 70–99)
Potassium: 3.9 mmol/L (ref 3.5–5.1)
Sodium: 141 mmol/L (ref 135–145)
Total Bilirubin: 1.3 mg/dL — ABNORMAL HIGH (ref 0.3–1.2)
Total Protein: 6.9 g/dL (ref 6.5–8.1)

## 2019-02-17 LAB — URINALYSIS, ROUTINE W REFLEX MICROSCOPIC
Bilirubin Urine: NEGATIVE
Glucose, UA: NEGATIVE mg/dL
Hgb urine dipstick: NEGATIVE
Ketones, ur: NEGATIVE mg/dL
Leukocytes,Ua: NEGATIVE
Nitrite: NEGATIVE
Protein, ur: 100 mg/dL — AB
Specific Gravity, Urine: 1.011 (ref 1.005–1.030)
pH: 5 (ref 5.0–8.0)

## 2019-02-17 LAB — RESPIRATORY PANEL BY RT PCR (FLU A&B, COVID)
Influenza A by PCR: NEGATIVE
Influenza B by PCR: NEGATIVE
SARS Coronavirus 2 by RT PCR: NEGATIVE

## 2019-02-17 LAB — RAPID URINE DRUG SCREEN, HOSP PERFORMED
Amphetamines: NOT DETECTED
Barbiturates: NOT DETECTED
Benzodiazepines: POSITIVE — AB
Cocaine: NOT DETECTED
Opiates: NOT DETECTED
Tetrahydrocannabinol: POSITIVE — AB

## 2019-02-17 LAB — ETHANOL: Alcohol, Ethyl (B): <10 mg/dL (ref <10)

## 2019-02-17 LAB — ACETAMINOPHEN LEVEL: Acetaminophen (Tylenol), Serum: <10 ug/mL — ABNORMAL LOW (ref 10–30)

## 2019-02-17 LAB — SALICYLATE LEVEL: Salicylate Lvl: <7 mg/dL — ABNORMAL LOW (ref 7.0–30.0)

## 2019-02-17 MED ORDER — ETOMIDATE 2 MG/ML IV SOLN
15.0000 mg | Freq: Once | INTRAVENOUS | Status: DC
  Filled 2019-02-17: qty 10

## 2019-02-17 MED ORDER — FENTANYL CITRATE (PF) 100 MCG/2ML IJ SOLN: 50.0000 ug | INTRAMUSCULAR | Status: DC | PRN

## 2019-02-17 MED ORDER — LORAZEPAM 2 MG/ML IJ SOLN
INTRAMUSCULAR | Status: AC
  Filled 2019-02-17: qty 1

## 2019-02-17 MED ORDER — MIDAZOLAM 50MG/50ML (1MG/ML) PREMIX INFUSION
2.0000 mg/h | INTRAVENOUS | Status: DC
  Administered 2019-02-17: 2 mg/h via INTRAVENOUS
  Filled 2019-02-17: qty 50

## 2019-02-17 MED ORDER — CHARCOAL ACTIVATED PO LIQD
50.0000 g | Freq: Once | ORAL | Status: AC
  Administered 2019-02-17: 50 g
  Filled 2019-02-17: qty 240

## 2019-02-17 MED ORDER — LORAZEPAM 2 MG/ML IJ SOLN
1.0000 mg | Freq: Once | INTRAMUSCULAR | Status: AC
  Administered 2019-02-17: 1 mg via INTRAVENOUS
  Filled 2019-02-17: qty 1

## 2019-02-17 MED ORDER — MIDAZOLAM BOLUS VIA INFUSION
1.0000 mg | INTRAVENOUS | Status: DC | PRN
  Administered 2019-02-18: 2 mg via INTRAVENOUS
  Filled 2019-02-17: qty 2

## 2019-02-17 MED ORDER — ROCURONIUM BROMIDE 50 MG/5ML IV SOLN
INTRAVENOUS | Status: AC | PRN
  Administered 2019-02-17: 50 mg via INTRAVENOUS

## 2019-02-17 MED ORDER — ROCURONIUM BROMIDE 50 MG/5ML IV SOLN
50.0000 mg | Freq: Once | INTRAVENOUS | Status: DC
  Filled 2019-02-17: qty 5

## 2019-02-17 MED ORDER — MIDAZOLAM 50MG/50ML (1MG/ML) PREMIX INFUSION
0.0000 mg/h | INTRAVENOUS | Status: DC
  Administered 2019-02-18: 5 mg/h via INTRAVENOUS
  Filled 2019-02-17: qty 50

## 2019-02-17 MED ORDER — MAGNESIUM SULFATE 2 GM/50ML IV SOLN
2.0000 g | Freq: Once | INTRAVENOUS | Status: AC
  Administered 2019-02-17: 2 g via INTRAVENOUS
  Filled 2019-02-17: qty 50

## 2019-02-17 MED ORDER — FENTANYL CITRATE (PF) 100 MCG/2ML IJ SOLN
25.0000 ug | INTRAMUSCULAR | Status: DC | PRN
  Administered 2019-02-17: 50 ug via INTRAVENOUS
  Filled 2019-02-17: qty 2

## 2019-02-17 MED ORDER — LACTATED RINGERS IV SOLN: INTRAVENOUS | Status: DC

## 2019-02-17 MED ORDER — FENTANYL CITRATE (PF) 100 MCG/2ML IJ SOLN
50.0000 ug | INTRAMUSCULAR | Status: DC | PRN
  Administered 2019-02-18: 50 ug via INTRAVENOUS

## 2019-02-17 MED ORDER — ETOMIDATE 2 MG/ML IV SOLN
INTRAVENOUS | Status: AC | PRN
  Administered 2019-02-17: 20 mg via INTRAVENOUS

## 2019-02-17 MED ORDER — ENOXAPARIN SODIUM 40 MG/0.4ML ~~LOC~~ SOLN
40.0000 mg | SUBCUTANEOUS | Status: DC
  Filled 2019-02-17: qty 0.4

## 2019-02-17 NOTE — ED Provider Notes (Signed)
[facility]-EMERGENCY DEPT Provider Note   CSN: [id number] Arrival date & time: 02/17/19  2054     History Chief Complaint  Patient presents with  . Drug Overdose    Molly Callahan is a 19 y.o. female.  Presents to ER after intentional overdose.  EMS reports ingestion around 7:00pm or 7:30pm.  Estimated 35-45 tablets of Lexapro 20 mg and 35 to 45 tablets Wellbutrin XL 300 mg.  History limited due to altered mental status.  Additional history obtained later from family, they checked on patient around 730pm this evening when they noticed patient to be complaining of headache, dizziness and nausea.  Unsure when she exactly took pills.  HPI     Past Medical History:  Diagnosis Date  . Anxiety   . 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 Depression   . Suicidal ideation 03/19/2016    Patient Active Problem List   Diagnosis Date Noted  . Anxiety   . Depression, recurrent (HCC) 03/19/2016  . Suicidal ideation 03/19/2016    Past Surgical History:  Procedure Laterality Date  . NO PAST SURGERIES       OB History   No obstetric history on file.     No family history on file.  Social History   Tobacco Use  . Smoking status: Never Smoker  . Smokeless tobacco: Never Used  Substance Use Topics  . Alcohol use: No  . Drug use: No    Home Medications Prior to Admission medications   Medication Sig Start Date End Date Taking? Authorizing Provider  buPROPion (WELLBUTRIN XL) 300 MG 24 hr tablet Take 1 tablet (300 mg total) by mouth daily. 12/24/18  Yes Cirigliano, Shawan K, DO  escitalopram (LEXAPRO) 20 MG tablet Take 1 tablet (20 mg total) by mouth at bedtime. 12/24/18  Yes Cirigliano, Tanea K, DO  ibuprofen (ADVIL,MOTRIN) 200 MG tablet Take 400 mg by mouth every 6 (six) hours as needed for headache.   Yes [provider]  MELATONIN PO Take 6 mg by mouth at bedtime as needed (sleep).    Yes [provider]    Allergies    Pollen  extract  Review of Systems   Review of Systems  Unable to perform ROS: Mental status change    Physical Exam Updated Vital Signs BP 110/73   Pulse (!) 133   Resp 18   LMP  (LMP Unknown)   SpO2 94%   Physical Exam Constitutional:      Comments: Agitated  HENT:     Head: Normocephalic.     Comments: Small mildly raised hematoma over right forehead    Nose: Nose normal.     Mouth/Throat:     Mouth: Mucous membranes are moist.  Eyes:     Comments: Pupils 5 mm equal round and reactive to light  Cardiovascular:     Rate and Rhythm: Tachycardia present.     Pulses: Normal pulses.     Heart sounds: Normal heart sounds.  Pulmonary:     Effort: Pulmonary effort is normal.     Breath sounds: Normal breath sounds.  Abdominal:     General: Abdomen is flat. There is no distension.     Palpations: There is no mass.  Musculoskeletal:        General: No tenderness, deformity or signs of injury.     Cervical back: Normal range of motion.  Skin:    General: Skin is warm and dry.     Capillary Refill:  Capillary refill takes less than 2 seconds.  Neurological:     Comments: Agitated, confused, moves all 4 extremities, eyes open spontaneously, mumbles incomprehensible sounds, sustained clonus in bilateral feet  Psychiatric:     Comments: Agitated     ED Results / Procedures / Treatments   Labs (all labs ordered are listed, but only abnormal results are displayed) Labs Reviewed  COMPREHENSIVE METABOLIC PANEL - Abnormal; Notable for the following components:      Result Value   CO2 18 (*)    Glucose, Bld 101 (*)    Calcium 8.5 (*)    Total Bilirubin 1.3 (*)    All other components within normal limits  ACETAMINOPHEN LEVEL - Abnormal; Notable for the following components:   Acetaminophen (Tylenol), Serum <10 (*)    All other components within normal limits  SALICYLATE LEVEL - Abnormal; Notable for the following components:   Salicylate Lvl <7.0 (*)    All other components  within normal limits  URINALYSIS, ROUTINE W REFLEX MICROSCOPIC - Abnormal; Notable for the following components:   Protein, ur 100 (*)    Bacteria, UA RARE (*)    All other components within normal limits  RESPIRATORY PANEL BY RT PCR (FLU A&B, COVID)  CBC WITH DIFFERENTIAL/PLATELET  ETHANOL  RAPID URINE DRUG SCREEN, HOSP PERFORMED  HCG, SERUM, QUALITATIVE    EKG EKG Interpretation  Date/Time:  Wednesday February 17 2019 22:32:55 EST Ventricular Rate:  134 PR Interval:    QRS Duration: 104 QT Interval:  402 QTC Calculation: 601 R Axis:   97 Text Interpretation: Sinus or ectopic atrial tachycardia Borderline right axis deviation Abnormal Q suggests inferior infarct Minimal ST depression, diffuse leads Prolonged QT interval Confirmed by Marianna Fuss ([id number]) on 02/17/2019 11:03:20 PM   Radiology DG Abdomen 1 View  Result Date: 02/17/2019 CLINICAL DATA:  OG tube EXAM: ABDOMEN - 1 VIEW COMPARISON:  None. FINDINGS: Esophageal tube tip overlies the gastric body. Nonobstructed bowel-gas pattern. IMPRESSION: Esophageal tube tip overlies the gastric body Electronically Signed   By: Jasmine Pang M.D.   On: 02/17/2019 22:14   CT Head Wo Contrast  Result Date: 02/17/2019 CLINICAL DATA:  19 year old female status post overdose, complaining of headache dizziness and nausea within subsequent seizure like activity. Combative. EXAM: CT HEAD WITHOUT CONTRAST TECHNIQUE: Contiguous axial images were obtained from the base of the skull through the vertex without intravenous contrast. COMPARISON:  None. FINDINGS: Brain: Normal cerebral volume. No midline shift, ventriculomegaly, mass effect, evidence of mass lesion, intracranial hemorrhage or evidence of cortically based acute infarction. Gray-white matter differentiation is within normal limits throughout the brain. Vascular: No suspicious intracranial vascular hyperdensity. Skull: Negative. Sinuses/Orbits: Visualized paranasal sinuses and mastoids are  clear. Other: Visualized orbits and scalp soft tissues are within normal limits. IMPRESSION: Normal noncontrast CT appearance of the brain. Electronically Signed   By: Odessa Fleming M.D.   On: 02/17/2019 23:09   DG Chest Portable 1 View  Result Date: 02/17/2019 CLINICAL DATA:  Post intubation EXAM: PORTABLE CHEST 1 VIEW COMPARISON:  None. FINDINGS: Endotracheal tube tip is about 1.5 cm superior to the carina. Esophageal tube tip is below the diaphragm but non included. Heart size is normal. Lung fields are clear IMPRESSION: Endotracheal tube tip about 1.5 cm superior to the carina. Clear lung fields Electronically Signed   By: Jasmine Pang M.D.   On: 02/17/2019 22:13    Procedures .Critical Care Performed by: Milagros Loll, MD Authorized by: Milagros Loll, MD  Critical care provider statement:    Critical care time (minutes):  55   Critical care was necessary to treat or prevent imminent or life-threatening deterioration of the following conditions:  Toxidrome, respiratory failure and CNS failure or compromise   Critical care was time spent personally by me on the following activities:  Discussions with consultants, evaluation of patient's response to treatment, examination of patient, ordering and performing treatments and interventions, ordering and review of laboratory studies, ordering and review of radiographic studies, pulse oximetry, re-evaluation of patient's condition, obtaining history from patient or surrogate and review of old charts Procedure Name: Intubation Date/Time: 02/17/2019 11:42 PM Performed by: Milagros Loll, MD Pre-anesthesia Checklist: Patient identified, Patient being monitored, Emergency Drugs available, Timeout performed and Suction available Oxygen Delivery Method: Ambu bag Preoxygenation: Pre-oxygenation with 100% oxygen Induction Type: Rapid sequence and Cricoid Pressure applied Ventilation: Mask ventilation without difficulty Laryngoscope Size:  Glidescope and 3 Grade View: Grade I Tube size: 7.5 mm Number of attempts: 1 Airway Equipment and Method: Stylet and Video-laryngoscopy Placement Confirmation: ETT inserted through vocal cords under direct vision,  CO2 detector and Breath sounds checked- equal and bilateral Secured at: 23 cm Tube secured with: ETT holder Comments: Good visualization of airway with video laryngoscopy, 1 attempt, atraumatic      (including critical care time)  Medications Ordered in ED Medications  midazolam (VERSED) 50 mg/50 mL (1 mg/mL) premix infusion (2 mg/hr Intravenous New Bag/Given 02/17/19 2157)  fentaNYL (SUBLIMAZE) injection 25 mcg (50 mcg Intravenous Given 02/17/19 2311)  magnesium sulfate IVPB 2 g 50 mL (2 g Intravenous New Bag/Given 02/17/19 2259)  LORazepam (ATIVAN) injection 1 mg (1 mg Intravenous Given 02/17/19 2114)  charcoal activated (NO SORBITOL) (ACTIDOSE-AQUA) suspension 50 g (50 g Per Tube Given 02/17/19 2209)  etomidate (AMIDATE) injection (20 mg Intravenous Given 02/17/19 2158)  rocuronium (ZEMURON) injection (50 mg Intravenous Given 02/17/19 2159)    ED Course  I have reviewed the triage vital signs and the nursing notes.  Pertinent labs & imaging results that were available during my care of the patient were reviewed by me and considered in my medical decision making (see chart for details).  Clinical Course as of Feb 17 2335  Wed Feb 17, 2019  2115 At bedside soon after arrival, significant lexapro, wellbutrin; seizure with EMS, now agitated, tachycardic, will give ativan, get tox labs, contact poison center   [RD]  2130 discussed with poison control staff Pat[place], recommending intubation to give activated charcoal - will reach out to toxicologist on call to discuss further, will prepare for likely intubation in interim   [RD]  2145 Discussed case with toxicology - Delaney with poison center at [facility] - given dose, concern for high risk of cardiac dysrhythmias, status, or other  decompensation and recommends intubation and charcoal   [RD]  2211 RSI completed, 1 attempt, no complications, RN placed OG, confirmed with XR, gave activated charcoal   [RD]  2243 Repeated EKG to monitor QTC, repeat EKG with QTC 601, will give 2 g magnesium   [RD]  2337 Updated family - mother and father   [RD]    Clinical Course User Index [RD] Milagros Loll, MD   MDM Rules/Calculators/A&P                      19 year old lady presented to ER after intentional overdose of Wellbutrin and Lexapro.  Estimated time of ingestion per report was around 7:00 PM.  Seizure with EMS.  Given Versed in route.  On arrival here patient was agitated, difficult to redirect, tachycardic, noted sustained clonus.  Initially gave Ativan here with some improvement but patient still confused.  Discussed with poison control including on-call toxicologist.  Strongly recommended giving activated charcoal, recommended intubating if needed and recommended early intubation for airway protection given high chance of rapid clinical deterioration, status epilepticus, cardiac dysrhythmias given quantity of Lexapro and Wellbutrin.  While in ER, patient had additional 1 minute tonic-clonic seizure.  Proceeded with RSI for airway protection, encephalopathy, medication administration.  Gave activated charcoal.  Versed drip with fentanyl as needed for sedation.  QTC initially borderline prolonged, repeat with prolonging QTC at 600.  Gave magnesium.  Critical care consulted.  Dr. Ander Slade will admit to ICU.  Final Clinical Impression(s) / ED Diagnoses Final diagnoses:  Intentional drug overdose, initial encounter ([place])  Encephalopathy  Seizure ([place])  Prolonged QT interval    Rx / DC Orders ED Discharge Orders    None       Lucrezia Starch, MD 02/17/19 2347

## 2019-02-17 NOTE — Progress Notes (Signed)
Pt. transported to/from CT scan with no complications.

## 2019-02-17 NOTE — ED Triage Notes (Signed)
Pt arrived via [facility] due to an intentional overdose on 35-45 of Lexapro and Wellbutrin each around 1930. EMS states she was complaining of headache, dizziness, and  nausea then has a posturing seizure with EMS at 2020 and became combative. Given 5mg  versed IM en route

## 2019-02-17 NOTE — Sedation Documentation (Signed)
7.5 ET tube placed at 23 @ lip

## 2019-02-17 NOTE — ED Notes (Signed)
Pt. Documented in error see above note in chart. 

## 2019-02-17 NOTE — ED Notes (Signed)
Date and time results received: 02/17/19 1040 (use smartphrase ".now" to insert current time)  Test: EKG Critical Value: QTc > 600   Name of Provider Notified: Dr. Stevie Kern  Orders Received? Or Actions Taken?: Actions Taken: waiting new orders and EDP aware

## 2019-02-17 NOTE — ED Notes (Signed)
Poison control concerned about further seizures, suggesting due to the suspected amount to intubate and administer charcoal via tube if patient is altered and unable to drink it. Continue cardiac monitoring and obtain an EKG every 6 hours or more if there are conduction delays.   Give benzos for seizures.   If QRS is 110 or greater give bicarb boluses   If QTC is 500 or greater give postassium and magnesium so levels will be on the higher end of normal  Suggests NOT giving intralipid's but having them at bedside to give in the even the patient crashes and goes into cardiac arrest.

## 2019-02-17 NOTE — ED Notes (Addendum)
Patients QTC reading 622. Provider made aware.

## 2019-02-17 NOTE — ED Notes (Signed)
EKG given to EDP,Dykstra,MD., for review.

## 2019-02-17 NOTE — ED Notes (Signed)
During timeout patient began having another seizure. Dr. Stevie Kern at bedside. Patient given oxygen supplementation and Versed order started.

## 2019-02-17 NOTE — H&P (Signed)
NAME:  Molly Callahan, MRN:  [id number], DOB:  14-Sep-2000, LOS: 0 ADMISSION DATE:  02/17/2019, CONSULTATION DATE:  02/17/2019 REFERRING MD:  Renae Fickle physician, CHIEF COMPLAINT:  Drug overdose   Brief History   History available from chart Intentional drug overdose  35 to 45 pills of Lexapro and Wellbutrin around 7:30 PM Was complaining of nausea headache and dizziness  Posturing in the ED with seizures Given Versed  History of depression, anxiety, past history of suicidal ideation  Significant Hospital Events   Seizures in the ED  Consults:  PCCM  Procedures:  Tracheal intubation 02/17/2019  Significant Diagnostic Tests:  Chest x-ray-no acute infiltrate CT head 02/17/2019-no significant abnormality  Micro Data:  None  Antimicrobials:  None  Interim history/subjective:  Sedated Comfortable  Objective   Blood pressure 106/69, pulse (!) 125, temperature (!) 97.4 F (36.3 C), resp. rate 16, SpO2 95 %.    Vent Mode: PRVC FiO2 (%):  [60 %-100 %] 60 % Set Rate:  [16 bmp] 16 bmp Vt Set:  [420 mL] 420 mL PEEP:  [5 cmH20] 5 cmH20 Plateau Pressure:  [11 cmH20] 11 cmH20   Intake/Output Summary (Last 24 hours) at 02/17/2019 2336 Last data filed at 02/17/2019 2010 Gross per 24 hour  Intake --  Output 50 ml  Net -50 ml   There were no vitals filed for this visit.  Examination: General: Young lady, does not appear to be in distress HENT: Moist oral mucosa, endotracheal tube in place Lungs: Clear breath sounds Cardiovascular: S1-S2 appreciated Abdomen: Bowel sounds appreciated Extremities: No clubbing, no edema Neuro: Sedate GU:   Resolved Hospital Problem list     Assessment & Plan:   Intentional drug overdose -Background history of anxiety/depression -Received activated charcoal -Poison control has been contacted -Recommendations noted  Acute respiratory failure -Continue ventilator support -Check arterial blood gases  Seizures -Continue  monitoring -Continue benzodiazepines  QTC prolongation -Monitoring -Avoid medications that would affect QTC  Best practice:  Diet: N.p.o. for now Pain/Anxiety/Delirium protocol (if indicated): Fentanyl and Versed VAP protocol (if indicated): In place DVT prophylaxis: In place GI prophylaxis:  Glucose control: Monitor Mobility: Bedrest Code Status: Full code Family Communication: Pending Disposition: ICU  Labs   CBC: Recent Labs  Lab 02/17/19 2113  WBC 9.2  NEUTROABS 6.4  HGB 12.7  HCT 38.8  MCV 90.0  PLT 229    Basic Metabolic Panel: Recent Labs  Lab 02/17/19 2113  NA 141  K 3.9  CL 109  CO2 18*  GLUCOSE 101*  BUN 7  CREATININE 0.83  CALCIUM 8.5*   GFR: CrCl cannot be calculated (Unknown ideal weight.). Recent Labs  Lab 02/17/19 2113  WBC 9.2    Liver Function Tests: Recent Labs  Lab 02/17/19 2113  AST 17  ALT 14  ALKPHOS 47  BILITOT 1.3*  PROT 6.9  ALBUMIN 4.1   No results for input(s): LIPASE, AMYLASE in the last 168 hours. No results for input(s): AMMONIA in the last 168 hours.  ABG No results found for: PHART, PCO2ART, PO2ART, HCO3, TCO2, ACIDBASEDEF, O2SAT   Coagulation Profile: No results for input(s): INR, PROTIME in the last 168 hours.  Cardiac Enzymes: No results for input(s): CKTOTAL, CKMB, CKMBINDEX, TROPONINI in the last 168 hours.  HbA1C: Hgb A1c MFr Bld  Date/Time Value Ref Range Status  10/22/2016 06:56 AM 4.8 4.8 - 5.6 % Final    Comment:    (NOTE) Pre diabetes:          5.7%-6.4%  Diabetes:              >6.4% Glycemic control for   <7.0% adults with diabetes   03/19/2016 06:43 AM 5.1 4.8 - 5.6 % Final    Comment:    (NOTE)         Pre-diabetes: 5.7 - 6.4         Diabetes: >6.4         Glycemic control for adults with diabetes: <7.0     CBG: No results for input(s): GLUCAP in the last 168 hours.  Review of Systems:   Unobtainable at present  Past Medical History  She,  has a past medical history of  Anxiety, Anxiety disorder of adolescence (03/19/2016), Depression, and Suicidal ideation (03/19/2016).   Surgical History    Past Surgical History:  Procedure Laterality Date  . NO PAST SURGERIES       Social History   reports that she has never smoked. She has never used smokeless tobacco. She reports that she does not drink alcohol or use drugs.   Family History   Her family history is not on file.   Allergies Allergies  Allergen Reactions  . Pollen Extract Other (See Comments)    Teary eyes     Home Medications  Prior to Admission medications   Medication Sig Start Date End Date Taking? Authorizing Provider  buPROPion (WELLBUTRIN XL) 300 MG 24 hr tablet Take 1 tablet (300 mg total) by mouth daily. 12/24/18  Yes Cirigliano, Erlene K, DO  escitalopram (LEXAPRO) 20 MG tablet Take 1 tablet (20 mg total) by mouth at bedtime. 12/24/18  Yes Cirigliano, Jennipher K, DO  ibuprofen (ADVIL,MOTRIN) 200 MG tablet Take 400 mg by mouth every 6 (six) hours as needed for headache.   Yes [provider]  MELATONIN PO Take 6 mg by mouth at bedtime as needed (sleep).    Yes [provider]     The patient is critically ill with multiple organ systems failure and requires high complexity decision making for assessment and support, frequent evaluation and titration of therapies, application of advanced monitoring technologies and extensive interpretation of multiple databases. Critical Care Time devoted to patient care services described in this note independent of APP/resident time (if applicable)  is 30 minutes.   Sherrilyn Rist MD [facility] Personal pager: [phone] If unanswered, please page CCM On-call: [phone]

## 2019-02-18 ENCOUNTER — Inpatient Hospital Stay (HOSPITAL_COMMUNITY): Payer: BC Managed Care – PPO

## 2019-02-18 ENCOUNTER — Inpatient Hospital Stay (HOSPITAL_COMMUNITY)
Admit: 2019-02-18 | Discharge: 2019-02-18 | Disposition: A | Discharge status: Home / Self Care | Payer: BC Managed Care – PPO | Attending: Internal Medicine | Admitting: Internal Medicine

## 2019-02-18 DIAGNOSIS — G2579 Other drug induced movement disorders: Secondary | ICD-10-CM | POA: Diagnosis not present

## 2019-02-18 DIAGNOSIS — T50901A Poisoning by unspecified drugs, medicaments and biological substances, accidental (unintentional), initial encounter: Secondary | ICD-10-CM | POA: Diagnosis not present

## 2019-02-18 DIAGNOSIS — I468 Cardiac arrest due to other underlying condition: Secondary | ICD-10-CM | POA: Diagnosis not present

## 2019-02-18 DIAGNOSIS — I471 Supraventricular tachycardia: Secondary | ICD-10-CM | POA: Diagnosis not present

## 2019-02-18 DIAGNOSIS — U071 COVID-19: Secondary | ICD-10-CM | POA: Diagnosis not present

## 2019-02-18 DIAGNOSIS — G40409 Other generalized epilepsy and epileptic syndromes, not intractable, without status epilepticus: Secondary | ICD-10-CM

## 2019-02-18 DIAGNOSIS — R918 Other nonspecific abnormal finding of lung field: Secondary | ICD-10-CM | POA: Diagnosis not present

## 2019-02-18 DIAGNOSIS — R569 Unspecified convulsions: Secondary | ICD-10-CM

## 2019-02-18 DIAGNOSIS — T50902A Poisoning by unspecified drugs, medicaments and biological substances, intentional self-harm, initial encounter: Secondary | ICD-10-CM | POA: Diagnosis not present

## 2019-02-18 DIAGNOSIS — T43292A Poisoning by other antidepressants, intentional self-harm, initial encounter: Secondary | ICD-10-CM | POA: Diagnosis not present

## 2019-02-18 DIAGNOSIS — J690 Pneumonitis due to inhalation of food and vomit: Secondary | ICD-10-CM

## 2019-02-18 DIAGNOSIS — J9601 Acute respiratory failure with hypoxia: Secondary | ICD-10-CM

## 2019-02-18 DIAGNOSIS — T50902D Poisoning by unspecified drugs, medicaments and biological substances, intentional self-harm, subsequent encounter: Secondary | ICD-10-CM | POA: Diagnosis not present

## 2019-02-18 DIAGNOSIS — G9340 Encephalopathy, unspecified: Secondary | ICD-10-CM | POA: Diagnosis not present

## 2019-02-18 DIAGNOSIS — I472 Ventricular tachycardia: Secondary | ICD-10-CM | POA: Diagnosis not present

## 2019-02-18 DIAGNOSIS — R9431 Abnormal electrocardiogram [ECG] [EKG]: Secondary | ICD-10-CM

## 2019-02-18 DIAGNOSIS — R031 Nonspecific low blood-pressure reading: Secondary | ICD-10-CM | POA: Diagnosis not present

## 2019-02-18 DIAGNOSIS — Z452 Encounter for adjustment and management of vascular access device: Secondary | ICD-10-CM

## 2019-02-18 DIAGNOSIS — G92 Toxic encephalopathy: Secondary | ICD-10-CM | POA: Diagnosis not present

## 2019-02-18 LAB — PHOSPHORUS
Phosphorus: 3.9 mg/dL (ref 2.5–4.6)
Phosphorus: 4.3 mg/dL (ref 2.5–4.6)
Phosphorus: 4.4 mg/dL (ref 2.5–4.6)

## 2019-02-18 LAB — BLOOD GAS, ARTERIAL
Acid-base deficit: 3.3 mmol/L — ABNORMAL HIGH (ref 0.0–2.0)
Acid-base deficit: 3.6 mmol/L — ABNORMAL HIGH (ref 0.0–2.0)
Acid-base deficit: 8.4 mmol/L — ABNORMAL HIGH (ref 0.0–2.0)
Acid-base deficit: 9 mmol/L — ABNORMAL HIGH (ref 0.0–2.0)
Allens test (pass/fail): UNDETERMINED — AB
Bicarbonate: 17.7 mmol/L — ABNORMAL LOW (ref 20.0–28.0)
Bicarbonate: 18.8 mmol/L — ABNORMAL LOW (ref 20.0–28.0)
Bicarbonate: 21.3 mmol/L (ref 20.0–28.0)
Bicarbonate: 21.4 mmol/L (ref 20.0–28.0)
Drawn by: 270211
Drawn by: 270211
FIO2: 0.3
FIO2: 100
FIO2: 60
FIO2: 90
MECHVT: 420 mL
MECHVT: 420 mL
O2 Saturation: 98.6 %
O2 Saturation: 98.9 %
O2 Saturation: 99.3 %
O2 Saturation: 99.5 %
Patient temperature: 101.3
Patient temperature: 97.4
Patient temperature: 98.6
Patient temperature: 98.6
RATE: 16 resp/min
RATE: 26 resp/min
pCO2 arterial: 39.4 mmHg (ref 32.0–48.0)
pCO2 arterial: 39.9 mmHg (ref 32.0–48.0)
pCO2 arterial: 43.8 mmHg (ref 32.0–48.0)
pCO2 arterial: 50.7 mmHg — ABNORMAL HIGH (ref 32.0–48.0)
pH, Arterial: 7.195 — CL (ref 7.350–7.450)
pH, Arterial: 7.241 — ABNORMAL LOW (ref 7.350–7.450)
pH, Arterial: 7.346 — ABNORMAL LOW (ref 7.350–7.450)
pH, Arterial: 7.353 (ref 7.350–7.450)
pO2, Arterial: 157 mmHg — ABNORMAL HIGH (ref 83.0–108.0)
pO2, Arterial: 164 mmHg — ABNORMAL HIGH (ref 83.0–108.0)
pO2, Arterial: 196 mmHg — ABNORMAL HIGH (ref 83.0–108.0)
pO2, Arterial: 288 mmHg — ABNORMAL HIGH (ref 83.0–108.0)

## 2019-02-18 LAB — BASIC METABOLIC PANEL
Anion gap: 7 (ref 5–15)
BUN: 9 mg/dL (ref 6–20)
CO2: 19 mmol/L — ABNORMAL LOW (ref 22–32)
Calcium: 8.4 mg/dL — ABNORMAL LOW (ref 8.9–10.3)
Chloride: 114 mmol/L — ABNORMAL HIGH (ref 98–111)
Creatinine, Ser: 1.2 mg/dL — ABNORMAL HIGH (ref 0.44–1.00)
GFR calc Af Amer: >60 mL/min (ref >60)
GFR calc non Af Amer: >60 mL/min (ref >60)
Glucose, Bld: 127 mg/dL — ABNORMAL HIGH (ref 70–99)
Potassium: 4.4 mmol/L (ref 3.5–5.1)
Sodium: 140 mmol/L (ref 135–145)

## 2019-02-18 LAB — CBC
HCT: 36.5 % (ref 36.0–46.0)
Hemoglobin: 11.7 g/dL — ABNORMAL LOW (ref 12.0–15.0)
MCH: 29.4 pg (ref 26.0–34.0)
MCHC: 32.1 g/dL (ref 30.0–36.0)
MCV: 91.7 fL (ref 80.0–100.0)
Platelets: 197 10*3/uL (ref 150–400)
RBC: 3.98 MIL/uL (ref 3.87–5.11)
RDW: 13 % (ref 11.5–15.5)
WBC: 14.5 10*3/uL — ABNORMAL HIGH (ref 4.0–10.5)
nRBC: 0 % (ref 0.0–0.2)

## 2019-02-18 LAB — COMPREHENSIVE METABOLIC PANEL
ALT: 10 U/L (ref 0–44)
AST: 19 U/L (ref 15–41)
Albumin: 3.5 g/dL (ref 3.5–5.0)
Alkaline Phosphatase: 43 U/L (ref 38–126)
Anion gap: 8 (ref 5–15)
BUN: 10 mg/dL (ref 6–20)
CO2: 21 mmol/L — ABNORMAL LOW (ref 22–32)
Calcium: 7.9 mg/dL — ABNORMAL LOW (ref 8.9–10.3)
Chloride: 112 mmol/L — ABNORMAL HIGH (ref 98–111)
Creatinine, Ser: 1.12 mg/dL — ABNORMAL HIGH (ref 0.44–1.00)
GFR calc Af Amer: >60 mL/min (ref >60)
GFR calc non Af Amer: >60 mL/min (ref >60)
Glucose, Bld: 122 mg/dL — ABNORMAL HIGH (ref 70–99)
Potassium: 3.3 mmol/L — ABNORMAL LOW (ref 3.5–5.1)
Sodium: 141 mmol/L (ref 135–145)
Total Bilirubin: 1.1 mg/dL (ref 0.3–1.2)
Total Protein: 5.9 g/dL — ABNORMAL LOW (ref 6.5–8.1)

## 2019-02-18 LAB — MRSA PCR SCREENING: MRSA by PCR: NEGATIVE

## 2019-02-18 LAB — AMMONIA: Ammonia: 56 umol/L — ABNORMAL HIGH (ref 9–35)

## 2019-02-18 LAB — HCG, SERUM, QUALITATIVE: Preg, Serum: NEGATIVE

## 2019-02-18 LAB — GLUCOSE, CAPILLARY
Glucose-Capillary: 89 mg/dL (ref 70–99)
Glucose-Capillary: 89 mg/dL (ref 70–99)
Glucose-Capillary: 95 mg/dL (ref 70–99)

## 2019-02-18 LAB — LACTIC ACID, PLASMA
Lactic Acid, Venous: 3.1 mmol/L (ref 0.5–1.9)
Lactic Acid, Venous: 3.6 mmol/L (ref 0.5–1.9)

## 2019-02-18 LAB — MAGNESIUM
Magnesium: 3 mg/dL — ABNORMAL HIGH (ref 1.7–2.4)
Magnesium: 2.8 mg/dL — ABNORMAL HIGH (ref 1.7–2.4)
Magnesium: 2.3 mg/dL (ref 1.7–2.4)
Magnesium: 2 mg/dL (ref 1.7–2.4)

## 2019-02-18 LAB — BRAIN NATRIURETIC PEPTIDE: B Natriuretic Peptide: 37.9 pg/mL (ref 0.0–100.0)

## 2019-02-18 LAB — HIV ANTIBODY (ROUTINE TESTING W REFLEX): HIV Screen 4th Generation wRfx: NONREACTIVE

## 2019-02-18 MED ORDER — MIDAZOLAM HCL 2 MG/2ML IJ SOLN
INTRAMUSCULAR | Status: AC
  Administered 2019-02-18: 2 mg
  Filled 2019-02-18: qty 2

## 2019-02-18 MED ORDER — ORAL CARE MOUTH RINSE
15.0000 mL | OROMUCOSAL | Status: DC
  Administered 2019-02-18 – 2019-02-22 (×43): 15 mL via OROMUCOSAL

## 2019-02-18 MED ORDER — FENTANYL CITRATE (PF) 100 MCG/2ML IJ SOLN: 50.0000 ug | INTRAMUSCULAR | Status: DC | PRN

## 2019-02-18 MED ORDER — SODIUM CHLORIDE 0.9 % IV SOLN
INTRAVENOUS | Status: DC | PRN
  Administered 2019-02-18 – 2019-02-19 (×2): 1000 mL via INTRAVENOUS

## 2019-02-18 MED ORDER — EPINEPHRINE 1 MG/10ML IJ SOSY
0.0500 mg | PREFILLED_SYRINGE | Freq: Once | INTRAMUSCULAR | Status: AC
  Administered 2019-02-18: 0.05 mg via INTRAVENOUS

## 2019-02-18 MED ORDER — ISOPROTERENOL HCL 0.2 MG/ML IJ SOLN
2.0000 ug/min | INTRAVENOUS | Status: DC
  Administered 2019-02-18 – 2019-02-19 (×3): 2 ug/min via INTRAVENOUS
  Filled 2019-02-18 (×4): qty 5

## 2019-02-18 MED ORDER — ORAL CARE MOUTH RINSE
15.0000 mL | OROMUCOSAL | Status: DC
  Administered 2019-02-18 (×2): 15 mL via OROMUCOSAL

## 2019-02-18 MED ORDER — PHENYLEPHRINE HCL-NACL 10-0.9 MG/250ML-% IV SOLN
0.0000 ug/min | INTRAVENOUS | Status: DC
  Administered 2019-02-18: 50 ug/min via INTRAVENOUS
  Administered 2019-02-18: 30 ug/min via INTRAVENOUS
  Administered 2019-02-18: 40 ug/min via INTRAVENOUS
  Filled 2019-02-18 (×4): qty 250

## 2019-02-18 MED ORDER — NOREPINEPHRINE 4 MG/250ML-% IV SOLN
INTRAVENOUS | Status: AC
  Filled 2019-02-18: qty 250

## 2019-02-18 MED ORDER — VALPROATE SODIUM 500 MG/5ML IV SOLN
1000.0000 mg | INTRAVENOUS | Status: AC
  Administered 2019-02-18: 1000 mg via INTRAVENOUS
  Filled 2019-02-18: qty 10

## 2019-02-18 MED ORDER — MIDAZOLAM HCL 2 MG/2ML IJ SOLN
2.0000 mg | INTRAMUSCULAR | Status: DC | PRN
  Administered 2019-02-18: 2 mg via INTRAVENOUS

## 2019-02-18 MED ORDER — FENTANYL CITRATE (PF) 100 MCG/2ML IJ SOLN
50.0000 ug | INTRAMUSCULAR | Status: DC | PRN
  Administered 2019-02-18 – 2019-02-19 (×5): 100 ug via INTRAVENOUS
  Filled 2019-02-18 (×5): qty 2

## 2019-02-18 MED ORDER — EPINEPHRINE 1 MG/10ML IJ SOSY
PREFILLED_SYRINGE | INTRAMUSCULAR | Status: AC
  Filled 2019-02-18: qty 10

## 2019-02-18 MED ORDER — CHLORHEXIDINE GLUCONATE CLOTH 2 % EX PADS
6.0000 | MEDICATED_PAD | Freq: Every day | CUTANEOUS | Status: DC
  Administered 2019-02-18 – 2019-02-23 (×7): 6 via TOPICAL

## 2019-02-18 MED ORDER — POTASSIUM CHLORIDE 10 MEQ/100ML IV SOLN
10.0000 meq | INTRAVENOUS | Status: AC
  Administered 2019-02-18 (×2): 10 meq via INTRAVENOUS
  Filled 2019-02-18 (×2): qty 100

## 2019-02-18 MED ORDER — MIDAZOLAM 50MG/50ML (1MG/ML) PREMIX INFUSION
0.0000 mg/h | INTRAVENOUS | Status: DC
  Administered 2019-02-18: 9 mg/h via INTRAVENOUS
  Administered 2019-02-18: 2 mg/h via INTRAVENOUS
  Administered 2019-02-18: 10 mg/h via INTRAVENOUS
  Administered 2019-02-18: 7 mg/h via INTRAVENOUS
  Administered 2019-02-19 – 2019-02-21 (×11): 10 mg/h via INTRAVENOUS
  Filled 2019-02-18 (×10): qty 50
  Filled 2019-02-18: qty 100
  Filled 2019-02-18 (×5): qty 50

## 2019-02-18 MED ORDER — VALPROATE SODIUM 500 MG/5ML IV SOLN
500.0000 mg | Freq: Two times a day (BID) | INTRAVENOUS | Status: DC
  Administered 2019-02-18: 500 mg via INTRAVENOUS
  Filled 2019-02-18 (×4): qty 5

## 2019-02-18 MED ORDER — CALCIUM GLUCONATE-NACL 1-0.675 GM/50ML-% IV SOLN: 1.0000 g | Freq: Once | INTRAVENOUS | Status: DC

## 2019-02-18 MED ORDER — SODIUM CHLORIDE 0.9 % IV SOLN
1.5000 g | Freq: Four times a day (QID) | INTRAVENOUS | Status: DC
  Administered 2019-02-18 – 2019-02-22 (×17): 1.5 g via INTRAVENOUS
  Filled 2019-02-18: qty 1.5
  Filled 2019-02-18 (×2): qty 4
  Filled 2019-02-18 (×4): qty 1.5
  Filled 2019-02-18: qty 4
  Filled 2019-02-18: qty 1.5
  Filled 2019-02-18 (×2): qty 4
  Filled 2019-02-18 (×9): qty 1.5

## 2019-02-18 MED ORDER — MIDAZOLAM BOLUS VIA INFUSION
1.0000 mg | INTRAVENOUS | Status: DC | PRN
  Administered 2019-02-18 (×2): 2 mg via INTRAVENOUS
  Filled 2019-02-18: qty 2

## 2019-02-18 MED ORDER — MAGNESIUM SULFATE 50 % IJ SOLN
2.0000 g | Freq: Once | INTRAMUSCULAR | Status: AC
  Administered 2019-02-18: 2 g via INTRAVENOUS
  Filled 2019-02-18: qty 4

## 2019-02-18 MED ORDER — METOPROLOL TARTRATE 5 MG/5ML IV SOLN
INTRAVENOUS | Status: AC
  Filled 2019-02-18: qty 5

## 2019-02-18 MED ORDER — NOREPINEPHRINE 4 MG/250ML-% IV SOLN
0.0000 ug/min | INTRAVENOUS | Status: DC
  Administered 2019-02-18: 20 ug/min via INTRAVENOUS

## 2019-02-18 MED ORDER — CHLORHEXIDINE GLUCONATE 0.12% ORAL RINSE (MEDLINE KIT)
15.0000 mL | Freq: Two times a day (BID) | OROMUCOSAL | Status: DC
  Administered 2019-02-18 – 2019-02-23 (×12): 15 mL via OROMUCOSAL

## 2019-02-18 MED ORDER — LACTATED RINGERS IV BOLUS
500.0000 mL | Freq: Once | INTRAVENOUS | Status: AC
  Administered 2019-02-18: 500 mL via INTRAVENOUS

## 2019-02-18 MED ORDER — FENTANYL 2500MCG IN NS 250ML (10MCG/ML) PREMIX INFUSION
0.0000 ug/h | INTRAVENOUS | Status: DC
  Administered 2019-02-18: 25 ug/h via INTRAVENOUS
  Filled 2019-02-18: qty 250

## 2019-02-18 MED ORDER — CALCIUM CHLORIDE 10 % IV SOLN
1.0000 g | Freq: Once | INTRAVENOUS | Status: DC
  Filled 2019-02-18: qty 10

## 2019-02-18 MED ORDER — CHLORHEXIDINE GLUCONATE 0.12% ORAL RINSE (MEDLINE KIT)
15.0000 mL | Freq: Two times a day (BID) | OROMUCOSAL | Status: DC
  Administered 2019-02-18: 15 mL via OROMUCOSAL

## 2019-02-18 MED ORDER — LACTATED RINGERS IV SOLN: INTRAVENOUS | Status: DC

## 2019-02-18 MED ORDER — MIDAZOLAM HCL 2 MG/2ML IJ SOLN
2.0000 mg | INTRAMUSCULAR | Status: DC | PRN
  Filled 2019-02-18: qty 2

## 2019-02-18 MED ORDER — METOPROLOL TARTRATE 5 MG/5ML IV SOLN: 2.5000 mg | Freq: Four times a day (QID) | INTRAVENOUS | Status: DC

## 2019-02-18 NOTE — Progress Notes (Signed)
Pt. transported uneventfully from ED/R-A to ICU/SD-1228, RT covering unit made aware.

## 2019-02-18 NOTE — ED Notes (Signed)
Versed drip increased to 5mg 

## 2019-02-18 NOTE — Progress Notes (Signed)
Belongings sent home with father.

## 2019-02-18 NOTE — Progress Notes (Signed)
Attempted echo . Patient not in room. At CT

## 2019-02-18 NOTE — Progress Notes (Signed)
PCCM attending:  Arrived @ 0710 immediately p VT arrest [phone]  with cpr x 2 minutes, now on levophed drip for hypotension p receiveing bolus mg, calcium, epi.    S: Was sedated on vent though off all sedation now and not resp to pain   0: Blood pressure 98/60, pulse (!) 123, temperature 98.4 F (36.9 C), resp. rate 19, height 5' 6.5" (1.689 m), weight 50.7 kg, SpO2 97 %.    Filed Weights   02/18/19 0200  Weight: 50.7 kg    Intake/Output from previous day:  Intake/Output Summary (Last 24 hours) at 02/18/2019 0733 Last data filed at 02/18/2019 0600 Gross per 24 hour  Intake 659.42 ml  Output 460 ml  Net 199.42 ml    Vent Mode: PRVC FiO2 (%):  [30 %-100 %] 30 % Set Rate:  [16 bmp] 16 bmp Vt Set:  [420 mL] 420 mL PEEP:  [5 cmH20] 5 cmH20 Plateau Pressure:  [11 cmH20-12 cmH20] 12 cmH20   On my exam:  No response to nox stimuli/ some twitching but no nystagmus Oral ET in place  RRR no s3  ST on levophed at rate 115 abd soft Ext warm Neuro :  No nystagmus, some periods of generalized twiching lasting few sec, no resp to pain   Interim Data: Lab Results  Component Value Date   WBC 14.5 (H) 02/18/2019   HGB 11.7 (L) 02/18/2019   HCT 36.5 02/18/2019   MCV 91.7 02/18/2019   PLT 197 02/18/2019   Lab Results  Component Value Date   NA 141 02/17/2019   K 3.9 02/17/2019   CL 109 02/17/2019   CO2 18 (L) 02/17/2019   GLUCOSE 101 (H) 02/17/2019   BUN 7 02/17/2019   CREATININE 0.83 02/17/2019   CALCIUM 8.5 (L) 02/17/2019    CXR ordered stat     Imp: Massive od on prozac / wellbutrin with prolonged QTc and now ? Cardiogenic shock?  - called cards on call for recs re QTc management but ideally needs to be off chronotropic support and on BB if bp allows so vol expanding for now.  AMS secondary to OD, sedation now sp cpr - some twitching of ext s nystagmus so doubt sz but EEG ordered to be complete - may need neuro imput here - in mean time rx twitching with versed    Vent dep acute resp failure >>> check cxr post cpr    .The patient is critically ill with multiple organ systems failure and requires high complexity decision making for assessment and support, frequent evaluation and titration of therapies, application of advanced monitoring technologies and extensive interpretation of multiple databases. Critical Care Time devoted to patient care services described in this note is 45 minutes.     Sandrea Hughs, MD Pulmonary and Critical Care Medicine [facility] Cell [phone] After 5:30 PM or weekends, use Beeper [phone]

## 2019-02-18 NOTE — Progress Notes (Addendum)
NAME:  Molly Callahan, MRN:  [id number], DOB:  2000/11/04, LOS: 1 ADMISSION DATE:  02/17/2019, CONSULTATION DATE:  02/17/2019 REFERRING MD:  Ed physician, CHIEF COMPLAINT:  Drug overdose   Brief History   History available from chart Intentional drug overdose 35 to 45 pills of Lexapro and Wellbutrin around 7:30 PM Was complaining of nausea headache and dizziness Posturing in the ED with seizures  History of depression, anxiety, past history of suicidal ideation  Significant Hospital Events   1/27 Seizures in the ED; intubated, Poison control notified, given activated charcoal. 1/28: QTC prolonged.  Became progressively widened up to 700 cardiology consulted who recommended IV beta-blockade. Intermittently having seizure like activity, initially starting with symmetric facial twitching which increased in intensity and was associated with bilateral arm flexion which was not rhythmic.  Became hypotensive requiring norepinephrine, at 6:57 AM developed V. tach arrest.  CPR lasted less than 3 minutes was defibrillated, given antiarrhythmic therapy, demonstrated successful return of spontaneous circulation.  Neurology consulted, loaded with Depakote.  Spiking fever, , White blood cell count rising, chest x-ray with significant right greater than left airspace disease raising concern for aspiration, Unasyn started. Consults:  PCCM  Procedures:  Tracheal intubation 02/17/2019  Significant Diagnostic Tests:  CT head 02/17/2019-no significant abnormality  Micro Data:  Respiratory culture 1/28>>> Antimicrobials:  Unasyn 1/28>> Interim history/subjective:  Remains unresponsive, having intermittent seizure-like activity  Objective   Blood pressure 116/63, pulse (Abnormal) 113, temperature (Abnormal) 100.4 F (38 C), resp. rate 14, height 5' 6.5" (1.689 m), weight 50.7 kg, SpO2 94 %.    Vent Mode: PRVC FiO2 (%):  [30 %-100 %] 100 % Set Rate:  [16 bmp-26 bmp] 26 bmp Vt Set:  [420 mL] 420  mL PEEP:  [5 cmH20] 5 cmH20 Plateau Pressure:  [11 cmH20-16 cmH20] 16 cmH20   Intake/Output Summary (Last 24 hours) at 02/18/2019 0855 Last data filed at 02/18/2019 0600 Gross per 24 hour  Intake 659.42 ml  Output 460 ml  Net 199.42 ml   Filed Weights   02/18/19 0200  Weight: 50.7 kg    Examination: General this is a 19 year old white female currently on full ventilatory support, she is unresponsive, and not responding to noxious stimulus.  She does have intermittent nonstimulated seizure-like activity HEENT normocephalic atraumatic orally intubated mucous membranes are moist.  Pupils fixed, sluggishly reactive Pulmonary: Bibasilar rales, right greater than left.  Current ventilator settings: fio2 100%/ peep 8  pplat 18 Cardiac Regular tachycardic with wide QTC no murmur rub or gallop Abdomen soft nontender no organomegaly Neuro: Unresponsive, no gag, pupils dilated and sluggishly responsive at best, does have corneals.  Intermittent seizure activity as described above. Extremities: Warm, pulses are palpable brisk capillary refill GU clear yellow  Resolved Hospital Problem list     Assessment & Plan:   Intentional drug overdose -Background history of anxiety/depression -Received activated charcoal -Poison control has been contacted Plan Continuing supportive care Continue telemetry Continue IV hydration Monitoring and replacing electrolytes  Seizure versus myoclonus Raising concern for possible anoxic injury.  Appreciate neurology bedside evaluation Plan Stat EEG, once stabilized will move to [facility] for LTM Loading with Depakote Seizure precautions Ensure adequate mean arterial pressure and saturation Serial neuro checks Adding versed gtt for on-going myoclonus as we load depakote (can't use propofol d/t QTc)  Status post VT arrest in the setting of prolonged QTC -Time to ROSC less than 3 minutes Plan Avoid b-blocker Adding isoproterenol (goal to increase HR  and shorten QTc)  Changing norepinephrine to phenylephrine Ensure normalized potassium and magnesium Every 4 hours QTC checks Avoiding QTC prolonging medications  Circulatory shock.  Suspect this is a mixed picture of septic shock secondary to aspiration but also possibly further complicated by acidosis  Plan Central access has been placed, will check central venous pressure with goal CVP 8-12 Titrate phenylephrine for mean arterial pressure greater than 65, may need to add shock dose vasopressin Ensure pH greater than 7.2  Acute hypoxic respiratory failure in the setting of diffuse bilateral right greater than left pulmonary infiltrates, likely represents aspiration pneumonia -Seems to be PEEP responsive thus far -High risk for evolving ARDS -Review of arterial blood gas demonstrated both respiratory and metabolic acidosis, minute ventilation has been adjusted Plan We will transition to low tidal volume ventilation in effort to minimize risk of ventilator associated lung injury Titrate PEEP/FiO2 per protocol, however be mindful given primarily right greater than left airspace disease PaO2 goal 55 to 65% P plat goal less than 30 Checking sputum culture and starting empiric Unasyn PAD protocol, RASS goal -2 VAP bundle A.m. chest x-ray Follow-up arterial blood gas later this morning, and as needed  Mild leukocytosis, could reflect sepsis Plan Trend CBC  Mild AKI Plan Ensure adequate filling pressures with CVP goal 8-12 Ensuring mean arterial pressure greater than 65 Renally adjust medications Strict intake and output A.m. chemistry and as needed chemistries  Mood and electrolyte imbalance/acid-base imbalance: Hypokalemia and nonanion gap metabolic acidosis -Plan Replacing potassium Discontinuing sodium chloride Lactated Ringer's for maintenance IV fluids Serial chemistry Follow-up lactic acid    Best practice:  Diet: N.p.o. for now Pain/Anxiety/Delirium protocol (if  indicated): Fentanyl and Versed VAP protocol (if indicated): In place DVT prophylaxis: In place GI prophylaxis:  Glucose control: Monitor Mobility: Bedrest Code Status: Full code Family Communication: Pending Disposition: ICU  My critical care time 46 minutes.   Simonne Martinet ACNP-BC [facility] Pulmonary/Critical Care Pager # [phone] OR # [phone] if no answer  I, Dr. Suzie Portela, have personally reviewed patient's available data, including medical history, events of note, physical examination and test results as part of my evaluation. I have discussed with NP Tanja Port  and other care providers all aspects of the patients PCCM issues as listed.  In addition,  I have personally evaluated the patient and assisted in the formulation of the management plan.   See my note from earlier today also.    Sandrea Hughs, MD Pulmonary and Critical Care Medicine [facility] Cell [phone] After 5:30 PM or weekends, use Beeper [phone]

## 2019-02-18 NOTE — Progress Notes (Signed)
LTM EEG hooked up and running - no initial skin breakdown - push button tested - neuro notified.  

## 2019-02-18 NOTE — Consult Note (Signed)
Neurology Consultation  Reason for Consult: Seizure versus myoclonic activity Referring Physician: Laurin Coder, MD  CC: Seizure versus myoclonic activity status post overdose and cardiac arrest  History is obtained from: Chart  HPI: Molly Callahan is a 19 y.o. female with a history of suicidal ideation, depression, anxiety and anxiety disorder of adolescence.  Neurology was asked to see patient after pulmonary critical care noted possible seizure activity versus myoclonic activity that was noted after brief cardiac arrest this morning.  Patient was admitted to [facility] long secondary to intentional drug overdose with 35 to 45 pills of Lexapro and Wellbutrin at approximately 7:30 PM yesterday.  While in the ED she was found to be posturing with seizures and given Versed.  Patient was intubated at that time for airway protection in addition given benzodiazepines for seizure.  Patient was also given activated charcoal.  While on the floor patient showed progressive widening of her QT and some ectopy.  Patient then became hypotensive.  At approximately 0657 this morning patient went into V. tach arrest was given Levophed drip for hypotension and bolus of magnesium and calcium.  Patient was defibrillated once with ROSC.  Total time of arrest was approximately 3 minutes.  Postarrest, patient was noted to have intermittent episodes which appeared to be seizure versus myoclonus.  On my arrival I did note 1 episode jerking of her left arm along with head.  This lasted for approximately 2 to 3 minutes and then subsided.  I did not note any eye deviation.  Of note Poison control was contacted, and their recommendations was supportive care.  Past Medical History:  Diagnosis Date  . Anxiety   . 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 Depression   . Suicidal ideation 03/19/2016    No family history on file.  And unable to obtain secondary to patient intubated  Social History:   reports that she  has never smoked. She has never used smokeless tobacco. She reports that she does not drink alcohol or use drugs.  Medications  Current Facility-Administered Medications:  .  ampicillin-sulbactam (UNASYN) 1.5 g in sodium chloride 0.9 % 100 mL IVPB, 1.5 g, Intravenous, Q6H, Erick Colace, NP, Stopped at 02/18/19 1036 .  calcium chloride injection 1 g, 1 g, Intravenous, Once, Ogan, Okoronkwo U, MD .  chlorhexidine gluconate (MEDLINE KIT) (PERIDEX) 0.12 % solution 15 mL, 15 mL, Mouth Rinse, BID, Olalere, Adewale A, MD, 15 mL at 02/18/19 0855 .  chlorhexidine gluconate (MEDLINE KIT) (PERIDEX) 0.12 % solution 15 mL, 15 mL, Mouth Rinse, BID, Erick Colace, NP .  Chlorhexidine Gluconate Cloth 2 % PADS 6 each, 6 each, Topical, Daily, Olalere, Adewale A, MD, 6 each at 02/18/19 0858 .  fentaNYL (SUBLIMAZE) injection 50 mcg, 50 mcg, Intravenous, Q15 min PRN, Erick Colace, NP .  fentaNYL (SUBLIMAZE) injection 50-200 mcg, 50-200 mcg, Intravenous, Q30 min PRN, Erick Colace, NP .  isoproterenol (ISUPREL) 1 mg in dextrose 5 % 250 mL (0.004 mg/mL) infusion, 2-20 mcg/min, Intravenous, Titrated, O'Neal, Cassie Freer, MD .  lactated ringers infusion, , Intravenous, Continuous, Olalere, Adewale A, MD, Last Rate: 100 mL/hr at 02/18/19 1009, Rate Verify at 02/18/19 1009 .  MEDLINE mouth rinse, 15 mL, Mouth Rinse, 10 times per day, Erick Colace, NP, 15 mL at 02/18/19 0952 .  metoprolol tartrate (LOPRESSOR) 5 MG/5ML injection, , , ,  .  metoprolol tartrate (LOPRESSOR) injection 2.5 mg, 2.5 mg, Intravenous, Q6H, Ogan, Kerry Kass, MD, Stopped at 02/18/19  6389 .  midazolam (VERSED) injection 2 mg, 2 mg, Intravenous, Q15 min PRN, Erick Colace, NP, 2 mg at 02/18/19 1004 .  midazolam (VERSED) injection 2 mg, 2 mg, Intravenous, Q2H PRN, Salvadore Dom E, NP .  norepinephrine (LEVOPHED) '4mg'$  in 224m premix infusion, 0-40 mcg/min, Intravenous, Titrated, Ogan, OKerry Kass MD, Stopped at 02/18/19 0955 .   phenylephrine (NEOSYNEPHRINE) 10-0.9 MG/250ML-% infusion, 0-400 mcg/min, Intravenous, Titrated, BErick Colace NP, Last Rate: 75 mL/hr at 02/18/19 1009, 50 mcg/min at 02/18/19 1009 .  potassium chloride 10 mEq in 100 mL IVPB, 10 mEq, Intravenous, Q1 Hr x 2, Kroeger, Krista M., PA-C, Stopped at 02/18/19 1027 .  valproate (DEPACON) 500 mg in dextrose 5 % 50 mL IVPB, 500 mg, Intravenous, Q12H, SEtta QuillR, PA-C  ROS:  *Unable to obtain due to intubation and patient nonresponsive.    Exam: Current vital signs: BP 116/63   Pulse (!) 106   Temp (!) 101.3 F (38.5 C) (Bladder)   Resp (!) 26   Ht 5' 6.5" (1.689 m)   Wt 50.7 kg   LMP  (LMP Unknown)   SpO2 95%   BMI 17.77 kg/m  Vital signs in last 24 hours: Temp:  [96.4 F (35.8 C)-101.3 F (38.5 C)] 101.3 F (38.5 C) (01/28 1000) Pulse Rate:  [104-153] 106 (01/28 1000) Resp:  [0-40] 26 (01/28 1000) BP: (92-128)/(57-93) 116/63 (01/28 0800) SpO2:  [87 %-100 %] 95 % (01/28 1000) FiO2 (%):  [30 %-100 %] 90 % (01/28 1000) Weight:  [50.7 kg] 50.7 kg (01/28 0200)   Constitutional: Appears well-developed and well-nourished.  Eyes: No scleral injection HENT: No OP obstrucion Head: Normocephalic.  Cardiovascular: Normal rate and regular rhythm.  Respiratory: Intubated GI: Soft.  No distension. There is no tenderness.  Skin: WDI  Neuro: Mental Status: Patient not on any sedation at this time patient is intubated breathing over the ventilator. patient does not respond to verbal stimuli.  Does not respond to deep sternal rub.  Does not follow commands.    Cranial Nerves: II: patient does not respond confrontation bilaterally,  III,IV,VI: doll's response absent bilaterally. pupils right 5 mm, left 5 mm,and nonreactive bilaterally V,VII: corneal reflex present bilaterally  VIII: patient does not respond to verbal stimuli IX,X: gag reflex present, XI: trapezius strength unable to test bilaterally XII: tongue strength unable to  test Motor: Extremities flaccid throughout.  No spontaneous movement noted.  No purposeful movements noted. Sensory: Does not respond to noxious stimuli in any extremity. Deep Tendon Reflexes:  Absent throughout. Plantars: upgoing bilaterally Cerebellar: Unable to perform   Labs I have reviewed labs in epic and the results pertinent to this consultation are:   CBC    Component Value Date/Time   WBC 14.5 (H) 02/18/2019 0225   RBC 3.98 02/18/2019 0225   HGB 11.7 (L) 02/18/2019 0225   HCT 36.5 02/18/2019 0225   PLT 197 02/18/2019 0225   MCV 91.7 02/18/2019 0225   MCH 29.4 02/18/2019 0225   MCHC 32.1 02/18/2019 0225   RDW 13.0 02/18/2019 0225   LYMPHSABS 1.9 02/17/2019 2113   MONOABS 0.7 02/17/2019 2113   EOSABS 0.2 02/17/2019 2113   BASOSABS 0.1 02/17/2019 2113    CMP     Component Value Date/Time   NA 141 02/18/2019 0637   K 3.3 (L) 02/18/2019 0637   CL 112 (H) 02/18/2019 0637   CO2 21 (L) 02/18/2019 0637   GLUCOSE 122 (H) 02/18/2019 0637   BUN 10 02/18/2019 03734  CREATININE 1.12 (H) 02/18/2019 0637   CALCIUM 7.9 (L) 02/18/2019 0637   PROT 5.9 (L) 02/18/2019 0637   ALBUMIN 3.5 02/18/2019 0637   AST 19 02/18/2019 0637   ALT 10 02/18/2019 0637   ALKPHOS 43 02/18/2019 0637   BILITOT 1.1 02/18/2019 0637   GFRNONAA >60 02/18/2019 0637   GFRAA >60 02/18/2019 0637    Lipid Panel     Component Value Date/Time   CHOL 157 10/22/2016 0656   TRIG 57 10/22/2016 0656   HDL 46 10/22/2016 0656   CHOLHDL 3.4 10/22/2016 0656   VLDL 11 10/22/2016 0656   LDLCALC 100 (H) 10/22/2016 0656     Imaging I have reviewed the images obtained:  CT-scan of the brain-normal noncontrast CT appearance of brain  Etta Quill PA-C [facility] [phone]  M-F  (9:00 am- 5:00 PM)  02/18/2019, 10:11 AM     Assessment:  This is an unfortunate 19 year old female who overdosed on 35 to 45 pills of Lexapro and Wellbutrin.  On arrival to ED she was noted to have  posturing and seizures.  While on the floor patient suffered from a 3-minute V. tach arrest with ROSC after 1 shock.  Currently on exam patient is having episodes that appear to be seizures versus myoclonus.  Cranial nerves show no doll's response, pupils 5 mm and nonreactive, intact corneals, intact gag, flaccid extremities with no response to pain and bilateral upgoing toes.  Patient most likely is suffering from multifactorial aspects such as serotonin syndrome, hypoxia, seizure activity.  Impression: -Severe toxic metabolic encephalopathy secondary to overdose of Lexapro and Wellbutrin -Suspect serotonin syndrome -Suspect seizure versus myoclonus from medication overdose as well as possible hypoxic/ischemic brain injury due to cardiac arrest.  Recommendations: -Stat EEG-for myoclonic seizures whole body twitching, diffuse encephalopathy. LTM EEG preliminary review no EEG change suggestive of seizure. -Depakote load of 1 g followed by maintenance dose of 500 mg twice daily -If continues to seize, consider Keppra added to the regimen.  Can also go up on Versed.  Avoid propofol due to prolonged QTC. -Supportive care per primary team. -MRI of unable to to assess if there is any evidence of anoxic/hypoxic injury   Attending addendum Patient seen and examined after she was transferred to [place] with history physical documented above. At independently verified history with a father who was at bedside. 35 to 45 pills of Lexapro and Wellbutrin approximately 7:30 PM yesterday, found to be posturing versus seizures.  Given Versed.  Intubated for airway protection. Given charcoal in the ICU.  Extremely prolonged QT intervals. Went into V. tach arrest this morning requiring CPR-total downtime 3 minutes prior to ROSC. Poison control recommended supportive care from a cardiac standpoint.  No role for dialysis On my examination, she was sedated on Versed 7 mg an hour, intubated, no  spontaneous movements initially, later on woke up as if she was being startled and moves all 4 extremities strongly. Cranial nerves: Pupils 5 mm fixed dilated, corneals present, oculocephalics absent, strong cough and gag, facial symmetry difficult to assess Motor exam: To noxious stimulation had withdrawal in both lower extremities stronger on the left than right and then suddenly had high amplitude myoclonic jerk followed by opening eyes and moving all extremities very strongly. Sensory exam: As above Coordination cannot be tested  Assessment and recommendations as above that I have edited. We will continue to follow patient with you.  -- Amie Portland, MD [facility] Pager: [phone] If 7pm to 7am,  please call on call as listed on AMION.  CRITICAL CARE ATTESTATION Performed by: Amie Portland, MD Total critical care time: 60 minutes Critical care time was exclusive of separately billable procedures and treating other patients and/or supervising APPs/Residents/Students Critical care was necessary to treat or prevent imminent or life-threatening deterioration due to possible serotonin syndrome, myoclonic jerks, concern for seizure, severe toxic metabolic encephalopathy This patient is critically ill and at significant risk for neurological worsening and/or death and care requires constant monitoring. Critical care was time spent personally by me on the following activities: development of treatment plan with patient and/or surrogate as well as nursing, discussions with consultants, evaluation of patient's response to treatment, examination of patient, obtaining history from patient or surrogate, ordering and performing treatments and interventions, ordering and review of laboratory studies, ordering and review of radiographic studies, pulse oximetry, re-evaluation of patient's condition, participation in multidisciplinary rounds and medical decision making of high complexity in the  care of this patient.

## 2019-02-18 NOTE — Progress Notes (Signed)
Patient was brought from ED with bilateral wrist restraint. No order was found. E-link notified and an order was placed.

## 2019-02-18 NOTE — Progress Notes (Signed)
ABG obtained on vent settings PRVC Vt 420 f 16 PEEP 5 FiO2 .30. Sample sent to lab for analysis.

## 2019-02-18 NOTE — Progress Notes (Signed)
EEG complete - results pending 

## 2019-02-18 NOTE — ED Notes (Signed)
Respiratory called for transport upstairs.

## 2019-02-18 NOTE — Consult Note (Signed)
Cardiology Consultation:   Patient ID: JALINA BLOWERS; [id number]; 22-Jul-2000   Admit date: 02/17/2019 Date of Consult: 02/18/2019  Primary Care Provider: Ronnald Nian, DO Primary Cardiologist: New to [facility] Primary Electrophysiologist:  None   Patient Profile:   PERSAIS ETHRIDGE is a 19 y.o. female with a PMH of anxiety, depression, suicidal ideations, who is being seen today for the evaluation of prolonged QT and VT arrest in the setting of intentional overdose with lexapro and wellbutrin at the request of Dr. Melvyn Novas.  History of Present Illness:   Ms. Zilberman was in her usual state of health until yesterday evening when she took 35-45 pill of both lexapro and wellbutrin around 7-7:30pm. Family activated EMS around 8pm when she complained of nausea, headache, and dizziness. She reportedly had seizure like activity en route to the ED and was given versed. On arrival to the ED she was agitated. Poison control was contacted and recommended intubation and activated charcoal administration, with close QTC monitoring. She was started on depakote for seizure activity. EKG on arrival showed sinus tachycardia with rate 142 bpm, QTC 497. QT interval progressively widened on subsequent EKG with QTC 601>635. Overnight cardiology curbsided and recommended beta blockade and IV metoprolol was started. She was admitted to the ICU for close monitoring. Unfortunately patient developed hypotension, increased ectopy, and had a VT arrest around 7am this morning with 3 minutes of CPR and defibrillation with ROSC achieved. She was started on levophed and given IV Mg, Ca, and Epi. CXR this morning with diffuse severe bilateral pulmonary infiltrates/edema (R>L). Labs K 3.9>3.3, Cr 0.83>1.12, Mg 2.8, WBC 9.2>14.5, Hgb 12.7>11.7, PLT 197, acetaminophen <33, Salicylate <7, urine pregnancy negative, COVID-19 negative, UTOX +benzo's (administed in ED) and marijuana. Cardiology asked to evaluate for prolonged QT and  VT arrest.    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 Authorizing Provider  buPROPion (WELLBUTRIN XL) 300 MG 24 hr tablet Take 1 tablet (300 mg total) by mouth daily. 12/24/18  Yes Cirigliano, Melodie K, DO  escitalopram (LEXAPRO) 20 MG tablet Take 1 tablet (20 mg total) by mouth at bedtime. 12/24/18  Yes Cirigliano, Giavonna K, DO  ibuprofen (ADVIL,MOTRIN) 200 MG tablet Take 400 mg by mouth every 6 (six) hours as needed for headache.   Yes [provider]  MELATONIN PO Take 6 mg by mouth at bedtime as needed (sleep).    Yes [provider]    Inpatient Medications: Scheduled Meds:  midazolam       calcium chloride  1 g Intravenous Once   chlorhexidine gluconate (MEDLINE KIT)  15 mL Mouth Rinse BID   Chlorhexidine Gluconate Cloth  6 each Topical Daily   enoxaparin (LOVENOX) injection  40 mg Subcutaneous Q24H   mouth rinse  15 mL Mouth Rinse 10 times per day   metoprolol tartrate       metoprolol tartrate  2.5 mg Intravenous Q6H   Continuous Infusions:  fentaNYL infusion INTRAVENOUS 100 mcg/hr (02/18/19 0600)   lactated ringers 100 mL/hr at 02/18/19 0600   midazolam 6 mg/hr (02/18/19 0600)   norepinephrine (LEVOPHED) Adult infusion 20 mcg/min (02/18/19 0731)   potassium chloride Stopped (02/18/19  0830)   PRN Meds: fentaNYL (SUBLIMAZE) injection, fentaNYL (SUBLIMAZE) injection, midazolam  Allergies:    Allergies  Allergen Reactions   Pollen Extract Other (See Comments)    Teary eyes    Social History:   Social History   Socioeconomic History   Marital status: Single    Spouse name: Not on file   Number of children: Not on file   Years of education: Not on file     Highest education level: Not on file  Occupational History   Not on file  Tobacco Use   Smoking status: Never Smoker   Smokeless tobacco: Never Used  Substance and Sexual Activity   Alcohol use: No   Drug use: No   Sexual activity: Yes    Birth control/protection: Condom  Other Topics Concern   Not on file  Social History Narrative   Not on file   Social Determinants of Health   Financial Resource Strain:    Difficulty of Paying Living Expenses: Not on file  Food Insecurity:    Worried About [place] in the Last Year: Not on file   [employer] of Food in the Last Year: Not on file  Transportation Needs:    Lack of Transportation (Medical): Not on file   Lack of Transportation (Non-Medical): Not on file  Physical Activity:    Days of Exercise per Week: Not on file   Minutes of Exercise per Session: Not on file  Stress:    Feeling of Stress : Not on file  Social Connections:    Frequency of Communication with Friends and Family: Not on file   Frequency of Social Gatherings with Friends and Family: Not on file   Attends Religious Services: Not on file   Active Member of Clubs or Organizations: Not on file   Attends Archivist Meetings: Not on file   Marital Status: Not on file  Intimate Partner Violence:    Fear of Current or Ex-Partner: Not on file   Emotionally Abused: Not on file   Physically Abused: Not on file   Sexually Abused: Not on file    Family History:   No family history on file. Patient unable to communicate and no family at bedside  ROS:  Please see the history of present illness.   All other ROS reviewed and negative.     Physical Exam/Data:   Vitals:   02/18/19 0500 02/18/19 0600 02/18/19 0700 02/18/19 0742  BP: 111/66 98/60 127/87   Pulse: (!) 104 (!) 123 (!) 118 (!) 121  Resp: '16 19 15 '$ (!) 0  Temp: (!) 96.4 F (35.8 C) 98.4 F (36.9 C) (!) 100.4 F (38 C) 100.2 F (37.9 C)  TempSrc:       SpO2: 99% 97% 100% 93%  Weight:      Height:        Intake/Output Summary (Last 24 hours) at 02/18/2019 0807 Last data filed at 02/18/2019 0600 Gross per 24 hour  Intake 659.42 ml  Output 460 ml  Net 199.42 ml   Filed Weights   02/18/19 0200  Weight: 50.7 kg   Body mass index is 17.77 kg/m.  General:  Critically ill - intubated HEENT: sclera anicteric, pupils non-reactive  Neck: no JVD Vascular: No carotid bruits; distal pulses 2+ bilaterally Cardiac:  normal S1, S2; tachycardic, regular rhythm; no obvious murmurs/rubs/gallops Lungs:  Intubated, diffuse rhonchi  Abd: NABS, soft, nontender, no hepatomegaly Ext: no edema Musculoskeletal:  No deformities, BUE and  BLE strength normal and equal Skin: warm and dry  Neuro:  Not responsive; intermittent seizure like activity observed (currently undergoing EEG) Psych:  Not responsive   EKG:  The EKG was personally reviewed and demonstrates:  Sinus tachycardia with rate 131, QTc 635, non-specific T wave abnormalities, no STE/D.  Telemetry:  Telemetry was personally reviewed and demonstrates:  Tachycardic to 120s with short runs of VT (~5 beats on multiple occasions), followed by VT for 2 minutes with return to sinus tachycardia.   Relevant CV Studies: None  Laboratory Data:  Chemistry Recent Labs  Lab 02/17/19 2113 02/18/19 0637  NA 141 141  K 3.9 3.3*  CL 109 112*  CO2 18* 21*  GLUCOSE 101* 122*  BUN 7 10  CREATININE 0.83 1.12*  CALCIUM 8.5* 7.9*  GFRNONAA >60 >60  GFRAA >60 >60  ANIONGAP 14 8    Recent Labs  Lab 02/17/19 2113 02/18/19 0637  PROT 6.9 5.9*  ALBUMIN 4.1 3.5  AST 17 19  ALT 14 10  ALKPHOS 47 43  BILITOT 1.3* 1.1   Hematology Recent Labs  Lab 02/17/19 2113 02/18/19 0225  WBC 9.2 14.5*  RBC 4.31 3.98  HGB 12.7 11.7*  HCT 38.8 36.5  MCV 90.0 91.7  MCH 29.5 29.4  MCHC 32.7 32.1  RDW 12.8 13.0  PLT 229 197   Cardiac EnzymesNo results for input(s): TROPONINI in the last 168 hours. No  results for input(s): TROPIPOC in the last 168 hours.  BNPNo results for input(s): BNP, PROBNP in the last 168 hours.  DDimer No results for input(s): DDIMER in the last 168 hours.  Radiology/Studies:  DG Abdomen 1 View  Result Date: 02/17/2019 CLINICAL DATA:  OG tube EXAM: ABDOMEN - 1 VIEW COMPARISON:  None. FINDINGS: Esophageal tube tip overlies the gastric body. Nonobstructed bowel-gas pattern. IMPRESSION: Esophageal tube tip overlies the gastric body Electronically Signed   By: Donavan Foil M.D.   On: 02/17/2019 22:14   CT Head Wo Contrast  Result Date: 02/17/2019 CLINICAL DATA:  19 year old female status post overdose, complaining of headache dizziness and nausea within subsequent seizure like activity. Combative. EXAM: CT HEAD WITHOUT CONTRAST TECHNIQUE: Contiguous axial images were obtained from the base of the skull through the vertex without intravenous contrast. COMPARISON:  None. FINDINGS: Brain: Normal cerebral volume. No midline shift, ventriculomegaly, mass effect, evidence of mass lesion, intracranial hemorrhage or evidence of cortically based acute infarction. Gray-white matter differentiation is within normal limits throughout the brain. Vascular: No suspicious intracranial vascular hyperdensity. Skull: Negative. Sinuses/Orbits: Visualized paranasal sinuses and mastoids are clear. Other: Visualized orbits and scalp soft tissues are within normal limits. IMPRESSION: Normal noncontrast CT appearance of the brain. Electronically Signed   By: Genevie Ann M.D.   On: 02/17/2019 23:09   DG Chest Portable 1 View  Result Date: 02/17/2019 CLINICAL DATA:  Post intubation EXAM: PORTABLE CHEST 1 VIEW COMPARISON:  None. FINDINGS: Endotracheal tube tip is about 1.5 cm superior to the carina. Esophageal tube tip is below the diaphragm but non included. Heart size is normal. Lung fields are clear IMPRESSION: Endotracheal tube tip about 1.5 cm superior to the carina. Clear lung fields Electronically  Signed   By: Donavan Foil M.D.   On: 02/17/2019 22:13    Assessment and Plan:   1. VT arrest in patient with prolonged QT 2/2 intentional overdose with lexapro/wellbutrin: patient presented after taking 35-45 pills of both lexapro and wellbutrin. She was intubated and given activated charcoal for management of overdose  and admitted to the ICU for close monitoring. She had further prolongation of her QTc and had a VT arrest with ROSC after 3 minutes of CPR + defibrillation. She has received IV Mg, Ca, and epi and is currently on levophed and neosynephrine. Unfortunately she is not responding to painful stimuli and pupils are non-reactive post arrest.  - Continue pressors per primary team - Will start isuprel for bradyarrhythmias - Monitor electrolytes closely - goal K>4, Mg >2. Will order additional IV potassium now (s/p 10 mEq IV x2 this AM).  - Continue to avoid amiodarone and other QT prolonging medications - Will check an echocardiogram, though do not anticipate further ischemic evaluation as VT was 2/2 medications.   2. Possible aspiration: CXR with new pulmonary infiltrates and with intermittent fevers and leukocytosis, raises c/f aspiration PNA. She was started on unasyn - Continue management per primary team  3. Seizure activity: noted to have intermittent facial/arm twitching since arrival. Started on depakote per Neurology. EEG underway with seizure like activity noted at that time. - Continue management per primary team   For questions or updates, please contact [place] Please consult [email or web address] for contact info under Cardiology/STEMI.   Signed, Abigail Butts, PA-C  02/18/2019 8:07 AM [phone]

## 2019-02-18 NOTE — Progress Notes (Signed)
Verbal order to apply ice packs for temp >100.4, tylenol ineffective w/ serotonin syndrome

## 2019-02-18 NOTE — Progress Notes (Signed)
Report given to Amy at 4[address] ICU.

## 2019-02-18 NOTE — Progress Notes (Signed)
CRITICAL VALUE ALERT  Critical Value: PH 7.19   Date & Time Notied:  0815, 02/18/19  Provider Notified: Anders Simmonds NP   Orders Received/Actions taken:  Awaiting orders

## 2019-02-18 NOTE — Progress Notes (Signed)
Wasted fentanyl and 37mL versed in stericycle with Francesca Oman, RN.

## 2019-02-18 NOTE — Progress Notes (Signed)
MRI compatible electrodes used for LTM hookup.

## 2019-02-18 NOTE — Procedures (Signed)
Central Venous Catheter Insertion Procedure Note Molly Callahan [id number] 07-Apr-2000  Procedure: Insertion of Central Venous Catheter Indications: Assessment of intravascular volume, Drug and/or fluid administration and Frequent blood sampling  Procedure Details Consent: Unable to obtain consent because of altered level of consciousness. Time Out: Verified patient identification, verified procedure, site/side was marked, verified correct patient position, special equipment/implants available, medications/allergies/relevent history reviewed, required imaging and test results available.  Performed  Maximum sterile technique was used including antiseptics, cap, gloves, gown, hand hygiene, mask and sheet. Skin prep: Chlorhexidine; local anesthetic administered A antimicrobial bonded/coated triple lumen catheter was placed in the left subclavian vein using the Seldinger technique.  Evaluation Blood flow good Complications: No apparent complications Patient did tolerate procedure well. Chest X-ray ordered to verify placement.  CXR: pending.  Molly Callahan 02/18/2019, 11:58 AM Simonne Martinet ACNP-BC [facility] Pulmonary/Critical Care Pager # [phone] OR # [phone] if no answer

## 2019-02-18 NOTE — Progress Notes (Signed)
eLink Physician-Brief Progress Note Patient Name: Molly Callahan DOB: Jun 10, 2000 MRN: [id number]   Date of Service  02/18/2019  HPI/Events of Note  Pt not on stress ulcer prophylaxis due to QT 0.635 and both Pantoprazole and Pepcid are QT prolonging drugs.  eICU Interventions  Defer stress ulcer prophylaxis until QT normalizes. KCL 20 meq iv to bring K+ above 4.0, will check an ionized calcium, plan is to  keep all electrolytes within normal limits until the QT normalizes, will place pacing pads on the patient as a precaution.        Thomasene Lot Antuane Eastridge 02/18/2019, 5:44 AM

## 2019-02-18 NOTE — Progress Notes (Signed)
eLink Physician-Brief Progress Note Patient Name: Molly Callahan DOB: 09-23-2000 MRN: [id number]   Date of Service  02/18/2019  HPI/Events of Note  Hypotension  eICU Interventions  Lactated Ringers 500 ml iv fluid bolus x 1     Intervention Category Major Interventions: Arrhythmia - evaluation and management  Migdalia Dk 02/18/2019, 6:35 AM

## 2019-02-18 NOTE — Progress Notes (Signed)
Initial Nutrition Assessment  DOCUMENTATION CODES:   Non-severe (moderate) malnutrition in context of chronic illness, Underweight  INTERVENTION:  - if patient to remain intubated >/= 24 hours and if cleared for TF start following aspiration event, recommend goal rate for TF of Pivot 1.5 @ 45 ml/hr. - this regimen will provide 1620 kcal (98% estimated kcal need), 101 grams protein, and 820 ml free water.    NUTRITION DIAGNOSIS:   Moderate Malnutrition related to chronic illness(depression) as evidenced by moderate fat depletion, moderate muscle depletion  GOAL:   Patient will meet greater than or equal to 90% of their needs  MONITOR:   Vent status, Labs, Weight trends  REASON FOR ASSESSMENT:   Ventilator  ASSESSMENT:   19 y.o. female with medical history of anxiety, depression, and suicidal ideation. She presented to the ED after intentional OD (35-45 pills of 20 mg Lexapro and 35-45 pills of 300 mg Wellbutrin XL). Family unsure of when patient took the pills, but they checked on her on 1/27 at ~1930 at which time she was complaining of nausea, headache, and dizziness. In the ED she was posturing with seizures, was given versed, and was intubated.  Patient remains intubated with OGT in place to LIS; ~200 ml charcoal-colored output in canister at this time. She had VT arrest shortly before 0700 today.   No family/visitors present. Patient admitted to [facility] ICU last night and now has a bed at [facility] on 4[address] with plan for continuous EEG.   Per chart review, weight this AM was 112 lb and PTA the most recent weight recorded was on 10/23/18 when she weighed 110 lb.   Per notes: - s/p EEG this AM - seizure vs myoclonus with concern for possible anoxic injury - Poison Control on board, no indication for dialysis at this time - aspiration event with diffuse R>L pulmonary infiltrates - circulatory shock, thought to be septic and possible acidosis - mild AKI   Patient is currently intubated  on ventilator support MV: 10.7 L/min Temp (24hrs), Avg:99.4 F (37.4 C), Min:96.4 F (35.8 C), Max:101.7 F (38.7 C) Propofol: none BP: 123/81 and MAP: 95   Labs reviewed; Cl: 114 mmol/l, creatinine: 1.2 mg/dl, Ca: 8.4 mg/dl. Medications reviewed; 1 g IV Ca Cl x1 dose 1/28, 50 g activated charcoal per tube x1 dose 1/27 at 2145, 2 g IV Mg sulfate x1 run 1/27 and x1 run 1/28, 10 mEq IV KCl x4 runs 1/28. IVF; LR @ 75 ml/hr. Drips; versed @ 4 mg/hr; neo @ 50 mcg/min; isuprel @ 2 mcg/min.      NUTRITION - FOCUSED PHYSICAL EXAM:    Most Recent Value  Orbital Region  No depletion  Upper Arm Region  Moderate depletion  Thoracic and Lumbar Region  Moderate depletion  Buccal Region  Moderate depletion  Temple Region  No depletion  Clavicle Bone Region  Moderate depletion  Clavicle and Acromion Bone Region  Moderate depletion  Scapular Bone Region  Unable to assess  Dorsal Hand  Mild depletion  Patellar Region  Moderate depletion  Anterior Thigh Region  Severe depletion  Posterior Calf Region  Severe depletion  Edema (RD Assessment)  None  Hair  Reviewed  Eyes  Reviewed  Mouth  Reviewed  Skin  Reviewed  Nails  Reviewed       Diet Order:   Diet Order            Diet NPO time specified  Diet effective now  EDUCATION NEEDS:   No education needs have been identified at this time  Skin:  Skin Assessment: Reviewed RN Assessment  Last BM:  PTA/unable  Height:   Ht Readings from Last 1 Encounters:  02/17/19 5' 6.5" (1.689 m)    Weight:   Wt Readings from Last 1 Encounters:  02/18/19 50.7 kg    Ideal Body Weight:  60.2 kg  BMI:  Body mass index is 17.77 kg/m.  Estimated Nutritional Needs:   Kcal:  1649 kcal  Protein:  91-101 grams (1.8-2 grams/kg)  Fluid:  >/= 1.8 L/day     Molly Matin, MS, RD, LDN, [facility] Inpatient Clinical Dietitian Pager # [phone] After hours/weekend pager # [phone]

## 2019-02-18 NOTE — Progress Notes (Signed)
CRITICAL VALUE ALERT  Critical Value:  Lactic acid 3.1   Date & Time Notied:  1030, 02/18/19   Provider Notified: Anders Simmonds NP   Orders Received/Actions taken: Everything stays the same

## 2019-02-18 NOTE — Progress Notes (Addendum)
I spoke to  poison control Updated on current findings and plan of care  Recommendations from their stand-point remain supportive. -treat seizures or myoclonus activity ->typical onset is usually at the 18 hr point and she is following this trend. Could last for extended time frame (days).  -no role for HD (discussed specifically) -continue current supportive care -watch QT closely  Maximize K and Mg (keep high nml)    Cardiology has added isuprel -if she were to have another cardiac arrest poison control recommends intravenous fat emulsion therapy (pasted below) -No role for empirically administering however    Will move her to [facility] Neuro ICU. There we will start LTM w/ plan to initiate NMB to isolate muscular artifact and better r/o seizure vs myoclonus      Simonne Martinet ACNP-BC [facility] Pulmonary/Critical Care Pager # [phone] OR # [phone] if no answer

## 2019-02-18 NOTE — Progress Notes (Signed)
NAME:  Molly Callahan, MRN:  [id number], DOB:  14-Sep-2000, LOS: 1 ADMISSION DATE:  02/17/2019, CONSULTATION DATE:  02/17/2019 REFERRING MD:  Ed physician, CHIEF COMPLAINT:  Drug overdose   Brief History   History available from chart Intentional drug overdose 35 to 45 pills of Lexapro and Wellbutrin around 7:30 PM Was complaining of nausea headache and dizziness Posturing in the ED with seizures  History of depression, anxiety, past history of suicidal ideation  Significant Hospital Events   1/27 Seizures in the ED; intubated, Poison control notified, given activated charcoal. 1/28: QTC prolonged.  Became progressively widened up to 700 cardiology consulted who recommended IV beta-blockade. Intermittently having seizure like activity, initially starting with symmetric facial twitching which increased in intensity and was associated with bilateral arm flexion which was not rhythmic.  Became hypotensive requiring norepinephrine, at 6:57 AM developed V. tach arrest.  CPR lasted less than 3 minutes was defibrillated, given antiarrhythmic therapy, demonstrated successful return of spontaneous circulation.  Neurology consulted, loaded with Depakote.  Spiking fever, , White blood cell count rising, chest x-ray with significant right greater than left airspace disease raising concern for aspiration, Unasyn started. Consults:  PCCM  Procedures:  Tracheal intubation 02/17/2019  Significant Diagnostic Tests:  CT head 02/17/2019-no significant abnormality  Micro Data:  Respiratory culture 1/28>>> Antimicrobials:  Unasyn 1/28>> Interim history/subjective:  Remains unresponsive, having intermittent seizure-like activity  Objective   Blood pressure 101/61, pulse (!) 123, temperature 100 F (37.8 C), temperature source Axillary, resp. rate (!) 23, height 5' 6.5" (1.689 m), weight 50.7 kg, SpO2 99 %. CVP:  [18 mmHg] 18 mmHg  Vent Mode: PRVC FiO2 (%):  [30 %-100 %] 90 % Set Rate:  [16 bmp-26 bmp] 26  bmp Vt Set:  [420 mL] 420 mL PEEP:  [5 cmH20-10 cmH20] 10 cmH20 Plateau Pressure:  [11 cmH20-21 cmH20] 21 cmH20   Intake/Output Summary (Last 24 hours) at 02/18/2019 1645 Last data filed at 02/18/2019 1500 Gross per 24 hour  Intake 2948.17 ml  Output 1510 ml  Net 1438.17 ml   Filed Weights   02/18/19 0200  Weight: 50.7 kg    Examination: General: thin, wdwn young adult F. Critically ill appearing, intubated, sedated, intermittently moving  HEENT: NCAT pink mmm  Trachea midline ETT secure  Pulmonary: R>L rhonchi. Symmetrical chest expansion. Initiating breaths spontaneously when awakened  Cardiac: tachycardic rate regular rhythm. s1s2 no rgm cap refill < 3 seconds  Abdomen: soft flat ndnt + bowel sounds  Neuro: 43mm pupils bilaterally, fixed, Hyperreflexic. Increased muscle tone. Grimaces, withdraws, then awakens to noxious stimuli and moving purposefully. Extremities: BUE wrist restraints. Symmetrical bulk. No cyanosis or clubbing  GU: clear yellow   Resolved Hospital Problem list     Assessment & Plan:   Serotonin Syndrome Intentional drug overdose -Background history of anxiety/depression -Received activated charcoal -Poison control has been contacted [place] Plan Poison control involved; if decompensates will need fat emulsion. See Anders Simmonds note for details Increasing IVF Liberal use of BZD for sedation Mag goal >2, K >2 If febrile use cool fluids, ice packs. APAP will not be beneficial in this instance  Close qtc monitoring, as below BZD, as below   Seizure versus myoclonus -likely in setting of serotonin syndrome. Anoxia from VT arrest is less favored due to neuro exam improvement  Plan Appreciate neuro following.  EEG Will need MRI, timing per neuro  AED per neuro Seizure precautions Avoiding propofol due to qtc as below  Status post VT arrest  in the setting of prolonged QTC -Time to ROSC less than 3 minutes Plan Avoid b-blocker Adding isoproterenol  (goal to increase HR and shorten QTc), appreciate cardiology recs  Continue neo for MAP > 65 Every 4 hours QTC checks Avoiding QTC prolonging medications (no prop, no precedex)  Trending Mag, K. Will check ionized calcium   Circulatory shock    -possible component of sepsis with likely aspiration PNA  Plan Trend CVP Anticipate that as we fluid resuscitate for serotonin syndrome we may see high/high normal CVP Neo for MAP > 65 Ensure pH greater than 7.2 abx for aspiration Trend WBC, fever   Acute hypoxic respiratory failure in the setting of diffuse bilateral right greater than left pulmonary infiltrates - likely represents aspiration pneumonia  -Seems to be PEEP responsive thus far -High risk for evolving ARDS Plan Continue MV support. Favor low tidals.  Adjust PEEP/FiO2 per ABG, mindfully in setting of R>L infiltrate PaO2 goal 55 to 65% P plat goal less than 30 VAP, PAD, pulm hygiene Follow up cx data.  Unasyn as above   AKI, mild Plan MAP goal > 65  Strict I/O No role for dialysis, specifically discussed with poison control  Trend renal indices   MDD with SI S/p intentional OD of antidepressant Rxs  Plan Will need psych when clinically improved  Best practice:  Diet: NPO Pain/Anxiety/Delirium protocol (if indicated): FVersed, PRN fent  VAP protocol (if indicated): In place DVT prophylaxis: In place GI prophylaxis:  Glucose control: Monitor Mobility: Bedrest Code Status: Full code Family Communication: I spoke at length with father at bedside in [place] Disposition: ICU   Additional Critical Care Time: 35 minutes  Eliseo Gum MSN, AGACNP-BC [place] [phone] If no answer, [phone] 02/18/2019, 4:45 PM

## 2019-02-18 NOTE — Progress Notes (Signed)
Abg drawn on the following vent settings:   PRVC mode, vt=420, rr=16, 60%, +5  Results for Molly Callahan, Molly Callahan (MRN [id number]) as of 02/18/2019 01:12  Ref. Range 02/18/2019 00:23  FIO2 Unknown 60.00  pH, Arterial Latest Ref Range: 7.350 - 7.450  7.346 (L)  pCO2 arterial Latest Ref Range: 32.0 - 48.0 mmHg 39.9  pO2, Arterial Latest Ref Range: 83.0 - 108.0 mmHg 288 (H)  Acid-base deficit Latest Ref Range: 0.0 - 2.0 mmol/L 3.6 (H)  Bicarbonate Latest Ref Range: 20.0 - 28.0 mmol/L 21.4  O2 Saturation Latest Units: % 99.5  Patient temperature Unknown 97.4  Allens test (pass/fail) Latest Ref Range: PASS  PASS

## 2019-02-18 NOTE — Progress Notes (Signed)
  Intake/Output Summary (Last 24 hours) at 02/18/2019 1120 Last data filed at 02/18/2019 1009 Gross per 24 hour  Intake 2011.14 ml  Output 460 ml  Net 1551.14 ml     CVP:  [18 mmHg] 18 mmHg    cxr c/w worsening pulmonary edema so will need to hold further fluid boluses and consider diuresis if gas change or peep dependency worsens   Sandrea Hughs, MD Pulmonary and Critical Care Medicine [facility] Cell [phone] After 5:30 PM or weekends, use Beeper [phone]

## 2019-02-18 NOTE — Progress Notes (Signed)
  Echocardiogram 2D Echocardiogram has been performed.  Celene Skeen 02/18/2019, 4:11 PM

## 2019-02-18 NOTE — Progress Notes (Signed)
eLink Physician-Brief Progress Note Patient Name: Molly Callahan DOB: [birth year] MRN: [id number]   Date of Service  02/18/2019  HPI/Events of Note  PT took a large overdose of Wellbutrin and Lexapro in a suicide attempt, she was intubated for airway protection and to facilitate admin of activated charcoal.  eICU Interventions  New Patient Evaluation completed, Fentanyl infusion added to optimize sedation on the ventilator,plan is to follow poison control recommendations,and consult Psychiatry. Restraints were ordered to prevent self-extubation.        [place] O[place] 02/18/2019, 2:00 AM

## 2019-02-18 NOTE — Progress Notes (Signed)
Patient QTC are 700 and greater. Elink doctor has been notified.

## 2019-02-18 NOTE — Procedures (Signed)
Arterial Catheter Insertion Procedure Note Molly Callahan [id number] 2000-08-03  Procedure: Insertion of Arterial Catheter  Indications: Blood pressure monitoring and Frequent blood sampling  Procedure Details Consent: Unable to obtain consent because of emergent medical necessity. Time Out: Verified patient identification, verified procedure, site/side was marked, verified correct patient position, special equipment/implants available, medications/allergies/relevent history reviewed, required imaging and test results available.  Performed  Maximum sterile technique was used including antiseptics, cap, gloves, gown, hand hygiene, mask and sheet. Skin prep: Chlorhexidine; local anesthetic administered 20 gauge catheter was inserted into left femoral artery using the Seldinger technique. ULTRASOUND GUIDANCE USED: YES Evaluation Blood flow good; BP tracing good. Complications: No apparent complications.   Molly Callahan 02/18/2019  Simonne Martinet ACNP-BC [facility] Pulmonary/Critical Care Pager # [phone] OR # [phone] if no answer

## 2019-02-18 NOTE — Plan of Care (Signed)
Reviewed LTM eeg till 1630. Dr Wilford Corner stimulated the patient after which she had whole body movement and cough which appeared voluntary. No concomitant eeg change was seen to suggest seizure. Dr Wilford Corner was notified.    Please review final report for details.   Molly Callahan

## 2019-02-18 NOTE — Progress Notes (Signed)
Verbal order to keep Isuprel at 28mcg/min to prevent PVCs & VTach

## 2019-02-18 NOTE — Procedures (Addendum)
Patient Name: Molly Callahan  MRN: [id number]  Epilepsy Attending: Charlsie Quest  Referring Physician/Provider: Dr Sandrea Hughs Date: 02/18/2019 Duration: 22.30 minutes  Patient history: 19 year old female presented after suicidal overdose with Lexapro, had cardiac arrest, ROSC achieved after 3 minutes and now having whole-body jerking episodes.  EEG to evaluate for seizures.  Level of alertness: Comatose  AEDs during EEG study: Versed  Technical aspects: This EEG study was done with scalp electrodes positioned according to the 10-20 International system of electrode placement. Electrical activity was acquired at a sampling rate of 500Hz  and reviewed with a high frequency filter of 70Hz  and a low frequency filter of 1Hz . EEG data were recorded continuously and digitally stored.   Description: At the onset EEG showed continuous generalized 2 to 3 Hz delta slowing.  At 10 AM, per EEG tech documentation patient had sudden eye opening and whole-body jerking.  Concomitant EEG showed generalized periodic epileptiform discharges. Three more similar events were captured at 10:01:01, 10:01:17 and 10:01:32. These events lasted 5-10 seconds and are consistent with myoclonic seizures. Between events EEG showed generalized EEG suppression lasting 3-6 seconds. Hyperventilation and photic stimulation were not performed due to ams.  Abnormality -Myoclonic seizures, generalized -Continuous slow, generalized  IMPRESSION: This study showed four myoclonic seizures characterized by whole body twitching and eye opening with generalized onset, lasting 5 to 10 seconds each.  Initial EEG showed evidence of severe diffuse encephalopathy, nonspecific etiology.  Dr. was notified immediately.

## 2019-02-18 NOTE — Progress Notes (Signed)
eLink Physician-Brief Progress Note Patient Name: Molly Callahan DOB: 2000/12/04 MRN: [id number]   Date of Service  02/18/2019  HPI/Events of Note  Pt with progressive widening of her QT, and now some ectopy.  eICU Interventions  Stat consult to cardiology-I spoke with Dr. Shirlee Latch who recommended trial of iv beta blocker, I've put her on Metoprolol 2.5 mg iv Q 6 hours with a hold for SBP < 90, I've ordered her hooked up to the defibrillator so that abrupt development of [place] of V-fib can be treated instantaneously, electrolytes have been optimized, ionized calcium stat ordered. MAR reviewed to make sure there are no QT prolonging drugs ordered.        Molly Callahan 02/18/2019, 6:29 AM

## 2019-02-19 ENCOUNTER — Inpatient Hospital Stay (HOSPITAL_COMMUNITY): Payer: BC Managed Care – PPO

## 2019-02-19 ENCOUNTER — Other Ambulatory Visit: Payer: Self-pay

## 2019-02-19 DIAGNOSIS — R9431 Abnormal electrocardiogram [ECG] [EKG]: Secondary | ICD-10-CM

## 2019-02-19 DIAGNOSIS — G9340 Encephalopathy, unspecified: Secondary | ICD-10-CM | POA: Diagnosis not present

## 2019-02-19 DIAGNOSIS — R569 Unspecified convulsions: Secondary | ICD-10-CM | POA: Diagnosis not present

## 2019-02-19 DIAGNOSIS — E440 Moderate protein-calorie malnutrition: Secondary | ICD-10-CM | POA: Insufficient documentation

## 2019-02-19 DIAGNOSIS — T50901A Poisoning by unspecified drugs, medicaments and biological substances, accidental (unintentional), initial encounter: Secondary | ICD-10-CM | POA: Diagnosis not present

## 2019-02-19 DIAGNOSIS — J9601 Acute respiratory failure with hypoxia: Secondary | ICD-10-CM

## 2019-02-19 DIAGNOSIS — J690 Pneumonitis due to inhalation of food and vomit: Secondary | ICD-10-CM | POA: Diagnosis not present

## 2019-02-19 DIAGNOSIS — G92 Toxic encephalopathy: Secondary | ICD-10-CM | POA: Diagnosis not present

## 2019-02-19 DIAGNOSIS — Z4682 Encounter for fitting and adjustment of non-vascular catheter: Secondary | ICD-10-CM | POA: Diagnosis not present

## 2019-02-19 DIAGNOSIS — I472 Ventricular tachycardia: Secondary | ICD-10-CM | POA: Diagnosis not present

## 2019-02-19 DIAGNOSIS — G2579 Other drug induced movement disorders: Secondary | ICD-10-CM | POA: Diagnosis not present

## 2019-02-19 DIAGNOSIS — T43292A Poisoning by other antidepressants, intentional self-harm, initial encounter: Secondary | ICD-10-CM | POA: Diagnosis not present

## 2019-02-19 LAB — POCT I-STAT 7, (LYTES, BLD GAS, ICA,H+H)
Acid-base deficit: 2 mmol/L (ref 0.0–2.0)
Bicarbonate: 21.6 mmol/L (ref 20.0–28.0)
Calcium, Ion: 1.26 mmol/L (ref 1.15–1.40)
HCT: 26 % — ABNORMAL LOW (ref 36.0–46.0)
Hemoglobin: 8.8 g/dL — ABNORMAL LOW (ref 12.0–15.0)
O2 Saturation: 99 %
Patient temperature: 99.7
Potassium: 4.3 mmol/L (ref 3.5–5.1)
Sodium: 145 mmol/L (ref 135–145)
TCO2: 23 mmol/L (ref 22–32)
pCO2 arterial: 34.1 mmHg (ref 32.0–48.0)
pH, Arterial: 7.413 (ref 7.350–7.450)
pO2, Arterial: 134 mmHg — ABNORMAL HIGH (ref 83.0–108.0)

## 2019-02-19 LAB — BASIC METABOLIC PANEL
Anion gap: 8 (ref 5–15)
BUN: 9 mg/dL (ref 6–20)
CO2: 21 mmol/L — ABNORMAL LOW (ref 22–32)
Calcium: 8.5 mg/dL — ABNORMAL LOW (ref 8.9–10.3)
Chloride: 113 mmol/L — ABNORMAL HIGH (ref 98–111)
Creatinine, Ser: 1.12 mg/dL — ABNORMAL HIGH (ref 0.44–1.00)
GFR calc Af Amer: >60 mL/min (ref >60)
GFR calc non Af Amer: >60 mL/min (ref >60)
Glucose, Bld: 85 mg/dL (ref 70–99)
Potassium: 4.2 mmol/L (ref 3.5–5.1)
Sodium: 142 mmol/L (ref 135–145)

## 2019-02-19 LAB — CBC
HCT: 31 % — ABNORMAL LOW (ref 36.0–46.0)
Hemoglobin: 9.8 g/dL — ABNORMAL LOW (ref 12.0–15.0)
MCH: 29.5 pg (ref 26.0–34.0)
MCHC: 31.6 g/dL (ref 30.0–36.0)
MCV: 93.4 fL (ref 80.0–100.0)
Platelets: 148 10*3/uL — ABNORMAL LOW (ref 150–400)
RBC: 3.32 MIL/uL — ABNORMAL LOW (ref 3.87–5.11)
RDW: 13.8 % (ref 11.5–15.5)
WBC: 15.8 10*3/uL — ABNORMAL HIGH (ref 4.0–10.5)
nRBC: 0 % (ref 0.0–0.2)

## 2019-02-19 LAB — PROTIME-INR
INR: 1.6 — ABNORMAL HIGH (ref 0.8–1.2)
Prothrombin Time: 18.5 seconds — ABNORMAL HIGH (ref 11.4–15.2)

## 2019-02-19 LAB — MAGNESIUM
Magnesium: 2 mg/dL (ref 1.7–2.4)
Magnesium: 2 mg/dL (ref 1.7–2.4)
Magnesium: 1.9 mg/dL (ref 1.7–2.4)

## 2019-02-19 LAB — PHOSPHORUS
Phosphorus: 4.2 mg/dL (ref 2.5–4.6)
Phosphorus: 3.3 mg/dL (ref 2.5–4.6)
Phosphorus: 3.3 mg/dL (ref 2.5–4.6)

## 2019-02-19 LAB — GLUCOSE, CAPILLARY
Glucose-Capillary: 101 mg/dL — ABNORMAL HIGH (ref 70–99)
Glucose-Capillary: 111 mg/dL — ABNORMAL HIGH (ref 70–99)
Glucose-Capillary: 71 mg/dL (ref 70–99)
Glucose-Capillary: 71 mg/dL (ref 70–99)
Glucose-Capillary: 72 mg/dL (ref 70–99)

## 2019-02-19 LAB — PROCALCITONIN: Procalcitonin: 1.35 ng/mL

## 2019-02-19 LAB — STREP PNEUMONIAE URINARY ANTIGEN: Strep Pneumo Urinary Antigen: NEGATIVE

## 2019-02-19 LAB — CALCIUM, IONIZED: Calcium, Ionized, Serum: 4.6 mg/dL (ref 4.5–5.6)

## 2019-02-19 LAB — VALPROIC ACID LEVEL: Valproic Acid Lvl: 32 ug/mL — ABNORMAL LOW (ref 50.0–100.0)

## 2019-02-19 MED ORDER — PANTOPRAZOLE SODIUM 40 MG PO PACK
40.0000 mg | PACK | Freq: Every day | ORAL | Status: DC
  Administered 2019-02-19 – 2019-02-22 (×4): 40 mg
  Filled 2019-02-19 (×4): qty 20

## 2019-02-19 MED ORDER — PIVOT 1.5 CAL PO LIQD
1000.0000 mL | ORAL | Status: DC
  Administered 2019-02-19 – 2019-02-21 (×3): 1000 mL

## 2019-02-19 MED ORDER — SODIUM CHLORIDE 0.9% FLUSH: 10.0000 mL | INTRAVENOUS | Status: DC | PRN

## 2019-02-19 MED ORDER — VALPROIC ACID 250 MG/5ML PO SOLN
500.0000 mg | Freq: Two times a day (BID) | ORAL | Status: DC
  Administered 2019-02-19 – 2019-02-22 (×7): 500 mg
  Filled 2019-02-19 (×8): qty 10

## 2019-02-19 MED ORDER — SODIUM CHLORIDE 0.9% FLUSH
10.0000 mL | Freq: Two times a day (BID) | INTRAVENOUS | Status: DC
  Administered 2019-02-19 – 2019-02-22 (×7): 10 mL

## 2019-02-19 NOTE — Progress Notes (Signed)
NAME:  Molly Callahan, MRN:  [id number], DOB:  October 21, 2000, LOS: 2 ADMISSION DATE:  02/17/2019, CONSULTATION DATE:  02/17/2019 REFERRING MD:  Ed physician, CHIEF COMPLAINT:  Drug overdose   Brief History   History available from chart Intentional drug overdose 35 to 45 pills of Lexapro and Wellbutrin around 7:30 PM Was complaining of nausea headache and dizziness Posturing in the ED with seizures  History of depression, anxiety, past history of suicidal ideation  Significant Hospital Events   1/27 Seizures in the ED; intubated, Poison control notified, given activated charcoal.  1/28: QTC prolonged.  Became progressively widened up to 700 cardiology consulted who recommended IV beta-blockade. Intermittently having seizure like activity, initially starting with symmetric facial twitching which increased in intensity and was associated with bilateral arm flexion which was not rhythmic.  Became hypotensive requiring norepinephrine, at 6:57 AM developed V. tach arrest.  CPR lasted less than 3 minutes was defibrillated, given antiarrhythmic therapy, demonstrated successful return of spontaneous circulation.  Neurology consulted, loaded with Depakote.  Spiking fever, , White blood cell count rising, chest x-ray with significant right greater than left airspace disease raising concern for aspiration, Unasyn started. Remains unresponsive, having intermittent seizure-like activity   1/28 - poiston control advice to [employer]  -  Recommendations from their stand-point remain supportive. -treat seizures or myoclonus activity ->typical onset is usually at the 18 hr point and she is following this trend. Could last for extended time frame (days).  -no role for HD (discussed specifically) -continue current supportive care -watch QT closely             Maximize K and Mg (keep high nml)                         Cardiology has added isuprel -if she were to have another cardiac arrest poison control  recommends intravenous fat emulsion therapy (see 02/18/2019 notes of P Babcock) No role for empirically administering however   Consults:  PCCM  Procedures:  Tracheal intubation 02/17/2019  Significant Diagnostic Tests:  CT head 02/17/2019-no significant abnormality  Micro Data:  Respiratory culture 1/28>>> Antimicrobials:  Unasyn 1/28>> Interim history/subjective:    02/19/2019 - per cards QTc has normalized and stopped isoproterenol this morning AICD not indictaed. Now in sinus. Off neo. On LR gtt and versed gtt. EEG wtihout seizure yesterday per Neuro-EP. Per neuro notes - poison control recommended supportive care  QTc 451 mosec. Very hyper-reflexic  On 50% fio2, peep 8 -> pulse ox ?  Tmax 101F/WBC 15K  Objective   Blood pressure 119/68, pulse (!) 120, temperature 99.1 F (37.3 C), resp. rate (!) 28, height 5' 6.5" (1.689 m), weight 50 kg, SpO2 100 %. CVP:  [2 mmHg-18 mmHg] 10 mmHg  Vent Mode: PRVC FiO2 (%):  [50 %-100 %] 50 % Set Rate:  [16 bmp-26 bmp] 26 bmp Vt Set:  [420 mL] 420 mL PEEP:  [5 cmH20-10 cmH20] 8 cmH20 Plateau Pressure:  [16 cmH20-21 cmH20] 16 cmH20   Intake/Output Summary (Last 24 hours) at 02/19/2019 0755 Last data filed at 02/19/2019 0736 Gross per 24 hour  Intake 5568.63 ml  Output 3125 ml  Net 2443.63 ml   Filed Weights   02/18/19 0200 02/19/19 0500  Weight: 50.7 kg 50 kg    Examination: General: thin, wdwn young adult F. Critically ill appearing, intubated, sedated, intermittently moving  HEENT: NCAT pink mmm  Trachea midline ETT secure  Pulmonary: R>L rhonchi. Symmetrical chest  expansion. Initiating breaths spontaneously when awakened  Cardiac: tachycardic rate regular rhythm. s1s2 no rgm cap refill < 3 seconds  Abdomen: soft flat ndnt + bowel sounds  Neuro: 6mm pupils bilaterally, fixed, Hyperreflexic. Increased muscle tone. Grimaces, withdraws, then awakens to noxious stimuli and moving purposefully. Extremities: BUE wrist restraints.  Symmetrical bulk. No cyanosis or clubbing  GU: clear yellow     General Appearance:  Looks criticall ill Head:  Normocephalic, without obvious abnormality, atraumatic Eyes:  PERRL - yes, conjunctiva/corneas - muddy     Ears:  Normal external ear canals, both ears Nose:  G tube - no Throat:  ETT TUBE - yes , OG tube - yes Neck:  Supple,  No enlargement/tenderness/nodules Lungs: Clear to auscultation bilaterally, Ventilator   Synchrony - yes Heart:  S1 and S2 normal, no murmur, CVP - no.  Pressors - no Abdomen:  Soft, no masses, no organomegaly Genitalia / Rectal:  Not done Extremities:  Extremities- intac .  Skin:  Intact in exposed areas . Sacral area - x Neurologic:  Sedation - versed gtt -> RASS - -2 . Moves all 4s - yes. CAM-ICU - cannot test . Orientation - followed command. VERY HYPER REFLEXIC.        Resolved Hospital Problem list     Assessment & Plan:   Serotonin Syndrome Intentional drug overdose -Background history of anxiety/depression -Received activated charcoal  02/19/2019 - still hyper-reflexiec  Plan Poison control involved; if decompensates will need fat emulsion. See Anders SimmondsPete Babcock note 02/18/2019  for further details Liberal use of BZD for sedation Mag goal >2 K >2 QTc monigoring If febrile use cool fluids, ice packs. APAP will not be beneficial in this instance     Seizure versus myoclonus - -likely in setting of serotonin syndrome. Anoxia from VT arrest is less favored due to neuro exam improvement   02/19/2019 -follows commands  Plan Appreciate neuro following.  EEG Will need MRI, timing per neuro  AED per neuro Seizure precautions Avoiding propofol due to qtc as below  Status post VT arrest in the setting of prolonged QTC - -Time to ROSC less than 3 minutes  02/19/2019 - QTc 450 msec and cards stopped  Isoprotenrol. Off pressors  Plan Avoid b-blocker QTc check - 9am, 3pm, 10pm 02/19/2019 and 6am 02/20/2019 Avoiding QTC prolonging  medications (no prop, no precedex)    Electrolyte imbalance   - 02/19/2019: pending  Plan  - Mag goal > 2 - K goal > 4 - Check Ionized calcium 02/20/2019   Circulatory shock    - -possible component of sepsis with likely aspiration PNA   02/19/2019\ - resolved and off pressors  Plan MAP goal > 65 Continue LR at 125cc/hr  Acute hypoxic respiratory failure in the setting of diffuse bilateral right greater than left pulmonary infiltrates: - likely represents aspiration pneumonia - -Seems to be PEEP responsive thus far. -High risk for evolving ARDS  02/19/2019 - good aBG on 50% fio2/peepp 8  Plan PRVC pusle ox goal > 88% VAP bundle  AKI, mild  - creat 1.2 on 1/28  Plan Recheck bmet 1/29 MAP goal > 65  Continue fluid rate LR 125/cchh Strict I/O No role for dialysis, specifically discussed with poison control  Trend renal indices   Anemia critical illness    02/19/2019 - no active bleeding  Plan  - - PRBC for hgb </= 6.9gm%    - exceptions are   -  if ACS susepcted/confirmed then transfuse for hgb </=  8.0gm%,  or    -  active bleeding with hemodynamic instability, then transfuse regardless of hemoglobin value   At at all times try to transfuse 1 unit prbc as possible with exception of active hemorrhage     MDD with SI S/p intentional OD of antidepressant Rxs   Plan Will need psych when clinically improved  Best practice:  Diet: NPO Pain/Anxiety/Delirium protocol (if indicated): FVersed, PRN fent  VAP protocol (if indicated): In place DVT prophylaxis: In place GI prophylaxis:  Glucose control: Monitor Mobility: Bedrest Code Status: Full code Family Communication: Mom and dad updated over phone 02/19/2019 8:49 AM   Disposition: ICU   Global: improving serotonin syndrome  LABS    PULMONARY Recent Labs  Lab 02/18/19 0023 02/18/19 0410 02/18/19 0800 02/18/19 1000 02/19/19 0822  PHART 7.346* 7.353 7.195* 7.241* 7.413  PCO2ART 39.9 39.4 50.7*  43.8 34.1  PO2ART 288* 164* 196* 157* 134.0*  HCO3 21.4 21.3 18.8* 17.7* 21.6  TCO2  --   --   --   --  23  O2SAT 99.5 99.3 98.9 98.6 99.0    CBC Recent Labs  Lab 02/17/19 2113 02/18/19 0225 02/19/19 0522  HGB 12.7 11.7* 9.8*  HCT 38.8 36.5 31.0*  WBC 9.2 14.5* 15.8*  PLT 229 197 148*    COAGULATION No results for input(s): INR in the last 168 hours.  CARDIAC  No results for input(s): TROPONINI in the last 168 hours. No results for input(s): PROBNP in the last 168 hours.   CHEMISTRY Recent Labs  Lab 02/17/19 2113 02/17/19 2113 02/18/19 0225 02/18/19 0637 02/18/19 0941 02/18/19 1654 02/18/19 2240 02/19/19 0522  NA 141  --   --  141 140  --   --   --   K 3.9   < >  --  3.3* 4.4  --   --   --   CL 109  --   --  112* 114*  --   --   --   CO2 18*  --   --  21* 19*  --   --   --   GLUCOSE 101*  --   --  122* 127*  --   --   --   BUN 7  --   --  10 9  --   --   --   CREATININE 0.83  --   --  1.12* 1.20*  --   --   --   CALCIUM 8.5*  --   --  7.9* 8.4*  --   --   --   MG  --   --  3.0* 2.8*  --  2.3 2.0 2.0  PHOS  --   --  3.9  --   --  4.3 4.4 4.2   < > = values in this interval not displayed.   Estimated Creatinine Clearance: 59 mL/min (A) (by C-G formula based on SCr of 1.2 mg/dL (H)).   LIVER Recent Labs  Lab 02/17/19 2113 02/18/19 0637  AST 17 19  ALT 14 10  ALKPHOS 47 43  BILITOT 1.3* 1.1  PROT 6.9 5.9*  ALBUMIN 4.1 3.5     INFECTIOUS Recent Labs  Lab 02/18/19 0941 02/18/19 1032  LATICACIDVEN 3.1* 3.6*     ENDOCRINE CBG (last 3)  Recent Labs    02/18/19 1942 02/18/19 2322 02/19/19 0331  GLUCAP 89 95 72         IMAGING x48h  - image(s) personally visualized  -  highlighted in bold DG Abdomen 1 View  Result Date: 02/17/2019 CLINICAL DATA:  OG tube EXAM: ABDOMEN - 1 VIEW COMPARISON:  None. FINDINGS: Esophageal tube tip overlies the gastric body. Nonobstructed bowel-gas pattern. IMPRESSION: Esophageal tube tip overlies the  gastric body Electronically Signed   By: Jasmine Pang M.D.   On: 02/17/2019 22:14   CT HEAD WO CONTRAST  Result Date: 02/18/2019 CLINICAL DATA:  Seizure. EXAM: CT HEAD WITHOUT CONTRAST TECHNIQUE: Contiguous axial images were obtained from the base of the skull through the vertex without intravenous contrast. COMPARISON:  February 17, 2019 FINDINGS: Brain: No evidence of acute infarction, hemorrhage, hydrocephalus, extra-axial collection or mass lesion/mass effect. Vascular: No hyperdense vessel or unexpected calcification. Skull: Normal. Negative for fracture or focal lesion. Sinuses/Orbits: No acute finding. Other: None. IMPRESSION: No acute intracranial pathology. Electronically Signed   By: Aram Candela M.D.   On: 02/18/2019 15:26   CT Head Wo Contrast  Result Date: 02/17/2019 CLINICAL DATA:  19 year old female status post overdose, complaining of headache dizziness and nausea within subsequent seizure like activity. Combative. EXAM: CT HEAD WITHOUT CONTRAST TECHNIQUE: Contiguous axial images were obtained from the base of the skull through the vertex without intravenous contrast. COMPARISON:  None. FINDINGS: Brain: Normal cerebral volume. No midline shift, ventriculomegaly, mass effect, evidence of mass lesion, intracranial hemorrhage or evidence of cortically based acute infarction. Gray-white matter differentiation is within normal limits throughout the brain. Vascular: No suspicious intracranial vascular hyperdensity. Skull: Negative. Sinuses/Orbits: Visualized paranasal sinuses and mastoids are clear. Other: Visualized orbits and scalp soft tissues are within normal limits. IMPRESSION: Normal noncontrast CT appearance of the brain. Electronically Signed   By: Odessa Fleming M.D.   On: 02/17/2019 23:09   DG Chest Port 1 View  Result Date: 02/19/2019 CLINICAL DATA:  Aspiration pneumonia, ETT, OD EXAM: PORTABLE CHEST 1 VIEW COMPARISON:  Radiograph 02/18/2019 FINDINGS: Endotracheal tube terminates in  the mid trachea, 5 cm from the carina. Left subclavian line tip terminates at the superior cavoatrial junction. Transesophageal tube tip and side port distal to the GE junction. Mixed interstitial and more consolidative perihilar opacity present throughout the right hemithorax with the left lung has largely cleared since comparison radiograph 1 day prior. Cardiomediastinal contours are stable. No acute osseous or soft tissue abnormality. IMPRESSION: Near complete resolution of the opacities in the left hemithorax likely reflecting resolving atelectasis Persistent opacity throughout the right lung may reflect sequela of aspiration and/or edema. Lines and tubes as above. Electronically Signed   By: Kreg Shropshire M.D.   On: 02/19/2019 05:45   DG Chest Port 1 View  Result Date: 02/18/2019 CLINICAL DATA:  Central line placement EXAM: PORTABLE CHEST 1 VIEW COMPARISON:  None. FINDINGS: Left subclavian central venous catheter tip in the SVC near the cavoatrial junction. No pneumothorax. Endotracheal tube in good position. Bilateral infiltrates right greater than left mild interval improvement. No effusion. IMPRESSION: Central line tip SVC.  No pneumothorax. Mild improvement in bilateral infiltrates right greater than left. Electronically Signed   By: Marlan Palau M.D.   On: 02/18/2019 09:34   DG Chest Port 1 View  Result Date: 02/18/2019 CLINICAL DATA:  COVID.  Low blood pressure. EXAM: PORTABLE CHEST 1 VIEW COMPARISON:  02/17/2019. FINDINGS: Endotracheal tube and NG tube in stable position. Heart size normal. Diffuse severe bilateral pulmonary infiltrates/edema, right side greater than left. No pleural effusion or pneumothorax. IMPRESSION: 1.  Endotracheal tube and NG tube stable position. 2. Diffuse severe bilateral pulmonary infiltrates/edema, right side greater  than left noted on today's exam. Electronically Signed   By: Marcello Moores  Register   On: 02/18/2019 08:15   DG Chest Portable 1 View  Result Date:  02/17/2019 CLINICAL DATA:  Post intubation EXAM: PORTABLE CHEST 1 VIEW COMPARISON:  None. FINDINGS: Endotracheal tube tip is about 1.5 cm superior to the carina. Esophageal tube tip is below the diaphragm but non included. Heart size is normal. Lung fields are clear IMPRESSION: Endotracheal tube tip about 1.5 cm superior to the carina. Clear lung fields Electronically Signed   By: Donavan Foil M.D.   On: 02/17/2019 22:13   EEG adult  Result Date: 02/18/2019 Lora Havens, MD     02/18/2019  1:12 PM Patient Name: JENEVIE CASSTEVENS MRN: [id number] Epilepsy Attending: Lora Havens Referring Physician/Provider: Dr Christinia Gully Date: 02/18/2019 Duration: 22.30 minutes Patient history: 19 year old female presented after suicidal overdose with Lexapro, had cardiac arrest, ROSC achieved after 3 minutes and now having whole-body jerking episodes.  EEG to evaluate for seizures. Level of alertness: Comatose AEDs during EEG study: Versed Technical aspects: This EEG study was done with scalp electrodes positioned according to the 10-20 International system of electrode placement. Electrical activity was acquired at a sampling rate of 500Hz  and reviewed with a high frequency filter of 70Hz  and a low frequency filter of 1Hz . EEG data were recorded continuously and digitally stored. Description: At the onset EEG showed continuous generalized 2 to 3 Hz delta slowing.  At 10 AM, per EEG tech documentation patient had sudden eye opening and whole-body jerking.  Concomitant EEG showed generalized periodic epileptiform discharges. Three more similar events were captured at 10:01:01, 10:01:17 and 10:01:32. These events lasted 5-10 seconds and are consistent with myoclonic seizures. Between events EEG showed generalized EEG suppression lasting 3-6 seconds. Hyperventilation and photic stimulation were not performed due to ams. Abnormality -Myoclonic seizures, generalized -Continuous slow, generalized IMPRESSION: This study showed  four myoclonic seizures characterized by whole body twitching and eye opening with generalized onset, lasting 5 to 10 seconds each.  Initial EEG showed evidence of severe diffuse encephalopathy, nonspecific etiology. Dr. Rory Percy was notified immediately.   ECHOCARDIOGRAM COMPLETE BUBBLE STUDY  Result Date: 02/18/2019   ECHOCARDIOGRAM REPORT   Patient Name:   KENNIDI YOSHIDA Date of Exam: 02/18/2019 Medical Rec #:  [id number]      Height:       66.5 in Accession #:    [id number]     Weight:       111.8 lb Date of Birth:  [birth year]      BSA:          1.57 m Patient Age:    18 years       BP:           101/61 mmHg Patient Gender: F              HR:           123 bpm. Exam Location:  Inpatient Procedure: 2D Echo Indications:    cardiac arrest  History:        Patient has no prior history of Echocardiogram examinations.  Sonographer:    Jannett Celestine RDCS (AE) Referring Phys: [id number] Cassie Freer O'NEAL  Sonographer Comments: Echo performed with patient supine and on artificial respirator. Image acquisition challenging due to respiratory motion. restricted mobility IMPRESSIONS  1. Left ventricular ejection fraction, by visual estimation, is 60 to 65%. The left ventricle has normal function. There is no left ventricular hypertrophy.  2. The left ventricle has no regional wall motion abnormalities.  3. Global right ventricle has normal systolic function.The right ventricular size is normal. No increase in right ventricular wall thickness.  4. Left atrial size was normal.  5. Right atrial size was normal.  6. The mitral valve is normal in structure. No evidence of mitral valve regurgitation. No evidence of mitral stenosis.  7. The tricuspid valve is normal in structure.  8. The tricuspid valve is normal in structure. Tricuspid valve regurgitation is not demonstrated.  9. The aortic valve is normal in structure. Aortic valve regurgitation is not visualized. No evidence of aortic valve sclerosis or stenosis. 10. The pulmonic  valve was normal in structure. Pulmonic valve regurgitation is not visualized. 11. The inferior vena cava is normal in size with greater than 50% respiratory variability, suggesting right atrial pressure of 3 mmHg. FINDINGS  Left Ventricle: Left ventricular ejection fraction, by visual estimation, is 60 to 65%. The left ventricle has normal function. The left ventricle has no regional wall motion abnormalities. There is no left ventricular hypertrophy. Normal left atrial pressure. Right Ventricle: The right ventricular size is normal. No increase in right ventricular wall thickness. Global RV systolic function is has normal systolic function. Left Atrium: Left atrial size was normal in size. Right Atrium: Right atrial size was normal in size Pericardium: There is no evidence of pericardial effusion. Mitral Valve: The mitral valve is normal in structure. No evidence of mitral valve regurgitation. No evidence of mitral valve stenosis by observation. Tricuspid Valve: The tricuspid valve is normal in structure. Tricuspid valve regurgitation is not demonstrated. Aortic Valve: The aortic valve is normal in structure. Aortic valve regurgitation is not visualized. The aortic valve is structurally normal, with no evidence of sclerosis or stenosis. Pulmonic Valve: The pulmonic valve was normal in structure. Pulmonic valve regurgitation is not visualized. Pulmonic regurgitation is not visualized. Aorta: The aortic root, ascending aorta and aortic arch are all structurally normal, with no evidence of dilitation or obstruction. Venous: The inferior vena cava is normal in size with greater than 50% respiratory variability, suggesting right atrial pressure of 3 mmHg. IAS/Shunts: No atrial level shunt detected by color flow Doppler. There is no evidence of a patent foramen ovale. No ventricular septal defect is seen or detected. There is no evidence of an atrial septal defect.  LEFT VENTRICLE PLAX 2D LVIDd:         3.93 cm LV PW:          0.99 cm LV IVS:        0.58 cm  LEFT ATRIUM           Index       RIGHT ATRIUM          Index LA Vol (A4C): 16.0 ml 10.19 ml/m RA Area:     6.29 cm                                   RA Volume:   8.39 ml  5.34 ml/m  AORTIC VALVE LVOT Vmax:   62.10 cm/s LVOT Vmean:  39.100 cm/s LVOT VTI:    0.080 m  SHUNTS Systemic VTI: 0.08 m  Chilton Si MD Electronically signed by Chilton Si MD Signature Date/Time: 02/18/2019/6:41:17 PM    Final

## 2019-02-19 NOTE — Progress Notes (Signed)
LTM maint complete - no skin breakdown under:  F7, fp1, fp2, f8

## 2019-02-19 NOTE — Progress Notes (Addendum)
NEURO HOSPITALIST PROGRESS NOTE   Subjective: Patient in bed with bilateral mittens and wrist restraints. Remains intubated. Starting to follow some commands. LTM running.  Exam: Vitals:   02/19/19 0800 02/19/19 0824  BP: 108/65   Pulse: (!) 119   Resp: (!) 26   Temp: 99.7 F (37.6 C)   SpO2: 100% 100%    Physical Exam  Constitutional: Appears well-developed and well-nourished.  Psych: Affect appropriate to situation Eyes: Normal external eye and conjunctiva. HENT: Normocephalic, no lesions, without obvious abnormality.   Musculoskeletal-no joint tenderness, deformity or swelling Cardiovascular: Normal rate and regular rhythm.  Respiratory: Effort normal, non-labored breathing saturations WNL intubated GI: Soft.  No distension. There is no tenderness.  Skin: WDI Neuro:  Mental Status: Drowsy, but opens eyes to voice, command and LT. Starting to follow some commands. "Wiggle toes, open eyes".  Cranial Nerves: Pupils 3 mm reactive, face appears symmetric in presence of ETT. PERRL. EOEMI Gag and cough intact.  Motor/sensory: Able to move all 4 extremities spontaneously.with good strength. Responds to noxious stimuli in all 4 extremities.  Tone and bulk:normal tone throughout; no atrophy noted  Deep Tendon Reflexes: hyperreflexic patella with sustained clonus bilaterally in the patella and at the ankle.  Upgoing toes bilaterally Cerebellar: UTA Gait: UTA    Medications:  Scheduled: . calcium chloride  1 g Intravenous Once  . chlorhexidine gluconate (MEDLINE KIT)  15 mL Mouth Rinse BID  . Chlorhexidine Gluconate Cloth  6 each Topical Daily  . feeding supplement (PIVOT 1.5 CAL)  1,000 mL Per Tube Q24H  . mouth rinse  15 mL Mouth Rinse 10 times per day  . sodium chloride flush  10-40 mL Intracatheter Q12H   Continuous: . sodium chloride 10 mL/hr at 02/19/19 0800  . ampicillin-sulbactam (UNASYN) IV 1.5 g (02/19/19 0913)  . isoproterenol (ISUPREL)  infusion Stopped (02/19/19 0759)  . lactated ringers 125 mL/hr at 02/19/19 0800  . midazolam 10 mg/hr (02/19/19 0800)  . valproate sodium Stopped (02/18/19 2203)   LPF:XTKWIO chloride, fentaNYL (SUBLIMAZE) injection, midazolam, midazolam, midazolam, sodium chloride flush  Pertinent Labs/Diagnostics:   DG Abdomen 1 View  Result Date: 02/17/2019 CLINICAL DATA:  OG tube EXAM: ABDOMEN - 1 VIEW COMPARISON:  None. FINDINGS: Esophageal tube tip overlies the gastric body. Nonobstructed bowel-gas pattern. IMPRESSION: Esophageal tube tip overlies the gastric body Electronically Signed   By: Donavan Foil M.D.   On: 02/17/2019 22:14   CT HEAD WO CONTRAST  Result Date: 02/18/2019 CLINICAL DATA:  Seizure. EXAM: CT HEAD WITHOUT CONTRAST TECHNIQUE: Contiguous axial images were obtained from the base of the skull through the vertex without intravenous contrast. COMPARISON:  February 17, 2019 FINDINGS: Brain: No evidence of acute infarction, hemorrhage, hydrocephalus, extra-axial collection or mass lesion/mass effect. Vascular: No hyperdense vessel or unexpected calcification. Skull: Normal. Negative for fracture or focal lesion. Sinuses/Orbits: No acute finding. Other: None. IMPRESSION: No acute intracranial pathology. Electronically Signed   By: Virgina Norfolk M.D.   On: 02/18/2019 15:26   CT Head Wo Contrast  Result Date: 02/17/2019 CLINICAL DATA:  19 year old female status post overdose, complaining of headache dizziness and nausea within subsequent seizure like activity. Combative. EXAM: CT HEAD WITHOUT CONTRAST TECHNIQUE: Contiguous axial images were obtained from the base of the skull through the vertex without intravenous contrast. COMPARISON:  None. FINDINGS: Brain: Normal cerebral volume. No midline shift, ventriculomegaly, mass effect, evidence  of mass lesion, intracranial hemorrhage or evidence of cortically based acute infarction. Gray-white matter differentiation is within normal limits throughout  the brain. Vascular: No suspicious intracranial vascular hyperdensity. Skull: Negative. Sinuses/Orbits: Visualized paranasal sinuses and mastoids are clear. Other: Visualized orbits and scalp soft tissues are within normal limits. IMPRESSION: Normal noncontrast CT appearance of the brain. Electronically Signed   By: Genevie Ann M.D.   On: 02/17/2019 23:09   DG Chest Port 1 View  Result Date: 02/19/2019 CLINICAL DATA:  Aspiration pneumonia, ETT, OD EXAM: PORTABLE CHEST 1 VIEW COMPARISON:  Radiograph 02/18/2019 FINDINGS: Endotracheal tube terminates in the mid trachea, 5 cm from the carina. Left subclavian line tip terminates at the superior cavoatrial junction. Transesophageal tube tip and side port distal to the GE junction. Mixed interstitial and more consolidative perihilar opacity present throughout the right hemithorax with the left lung has largely cleared since comparison radiograph 1 day prior. Cardiomediastinal contours are stable. No acute osseous or soft tissue abnormality. IMPRESSION: Near complete resolution of the opacities in the left hemithorax likely reflecting resolving atelectasis Persistent opacity throughout the right lung may reflect sequela of aspiration and/or edema. Lines and tubes as above. Electronically Signed   By: Lovena Le M.D.   On: 02/19/2019 05:45   DG Chest Port 1 View  Result Date: 02/18/2019 CLINICAL DATA:  Central line placement EXAM: PORTABLE CHEST 1 VIEW COMPARISON:  None. FINDINGS: Left subclavian central venous catheter tip in the SVC near the cavoatrial junction. No pneumothorax. Endotracheal tube in good position. Bilateral infiltrates right greater than left mild interval improvement. No effusion. IMPRESSION: Central line tip SVC.  No pneumothorax. Mild improvement in bilateral infiltrates right greater than left. Electronically Signed   By: Franchot Gallo M.D.   On: 02/18/2019 09:34   DG Chest Port 1 View  Result Date: 02/18/2019 CLINICAL DATA:  COVID.  Low  blood pressure. EXAM: PORTABLE CHEST 1 VIEW COMPARISON:  02/17/2019. FINDINGS: Endotracheal tube and NG tube in stable position. Heart size normal. Diffuse severe bilateral pulmonary infiltrates/edema, right side greater than left. No pleural effusion or pneumothorax. IMPRESSION: 1.  Endotracheal tube and NG tube stable position. 2. Diffuse severe bilateral pulmonary infiltrates/edema, right side greater than left noted on today's exam. Electronically Signed   By: Marcello Moores  Register   On: 02/18/2019 08:15   DG Chest Portable 1 View  Result Date: 02/17/2019 CLINICAL DATA:  Post intubation EXAM: PORTABLE CHEST 1 VIEW COMPARISON:  None. FINDINGS: Endotracheal tube tip is about 1.5 cm superior to the carina. Esophageal tube tip is below the diaphragm but non included. Heart size is normal. Lung fields are clear IMPRESSION: Endotracheal tube tip about 1.5 cm superior to the carina. Clear lung fields Electronically Signed   By: Donavan Foil M.D.   On: 02/17/2019 22:13   EEG adult  Result Date: 02/18/2019 Lora Havens, MD     02/18/2019  1:12 PM Patient Name: Molly Callahan MRN: [id number] Epilepsy Attending: Lora Havens Referring Physician/Provider: Dr Christinia Gully Date: 02/18/2019 Duration: 22.30 minutes Patient history: 19 year old female presented after suicidal overdose with Lexapro, had cardiac arrest, ROSC achieved after 3 minutes and now having whole-body jerking episodes.  EEG to evaluate for seizures. Level of alertness: Comatose AEDs during EEG study: Versed Technical aspects: This EEG study was done with scalp electrodes positioned according to the 10-20 International system of electrode placement. Electrical activity was acquired at a sampling rate of '500Hz'$  and reviewed with a high frequency filter of '70Hz'$  and  a low frequency filter of '1Hz'$ . EEG data were recorded continuously and digitally stored. Description: At the onset EEG showed continuous generalized 2 to 3 Hz delta slowing.  At 10 AM, per  EEG tech documentation patient had sudden eye opening and whole-body jerking.  Concomitant EEG showed generalized periodic epileptiform discharges. Three more similar events were captured at 10:01:01, 10:01:17 and 10:01:32. These events lasted 5-10 seconds and are consistent with myoclonic seizures. Between events EEG showed generalized EEG suppression lasting 3-6 seconds. Hyperventilation and photic stimulation were not performed due to ams. Abnormality -Myoclonic seizures, generalized -Continuous slow, generalized IMPRESSION: This study showed four myoclonic seizures characterized by whole body twitching and eye opening with generalized onset, lasting 5 to 10 seconds each.  Initial EEG showed evidence of severe diffuse encephalopathy, nonspecific etiology. Dr. Rory Percy was notified immediately.   ECHOCARDIOGRAM COMPLETE BUBBLE STUDY  Result Date: 02/18/2019   ECHOCARDIOGRAM REPORT   Patient Name:   Molly Callahan Date of Exam: 02/18/2019 Medical Rec #:  [id number]      Height:       66.5 in Accession #:    [id number]     Weight:       111.8 lb Date of Birth:  02-13-2000      BSA:          1.57 m Patient Age:    18 years       BP:           101/61 mmHg Patient Gender: F              HR:           123 bpm. Exam Location:  Inpatient Procedure: 2D Echo Indications:    cardiac arrest  History:        Patient has no prior history of Echocardiogram examinations.  Sonographer:    Jannett Celestine RDCS (AE) Referring Phys: [id number] Cassie Freer O'NEAL  Sonographer Comments: Echo performed with patient supine and on artificial respirator. Image acquisition challenging due to respiratory motion. restricted mobility IMPRESSIONS  1. Left ventricular ejection fraction, by visual estimation, is 60 to 65%. The left ventricle has normal function. There is no left ventricular hypertrophy.  2. The left ventricle has no regional wall motion abnormalities.  3. Global right ventricle has normal systolic function.The right ventricular size  is normal. No increase in right ventricular wall thickness.  4. Left atrial size was normal.  5. Right atrial size was normal.  6. The mitral valve is normal in structure. No evidence of mitral valve regurgitation. No evidence of mitral stenosis.  7. The tricuspid valve is normal in structure.  8. The tricuspid valve is normal in structure. Tricuspid valve regurgitation is not demonstrated.  9. The aortic valve is normal in structure. Aortic valve regurgitation is not visualized. No evidence of aortic valve sclerosis or stenosis. 10. The pulmonic valve was normal in structure. Pulmonic valve regurgitation is not visualized. 11. The inferior vena cava is normal in size with greater than 50% respiratory variability, suggesting right atrial pressure of 3 mmHg. FINDINGS  Left Ventricle: Left ventricular ejection fraction, by visual estimation, is 60 to 65%. The left ventricle has normal function. The left ventricle has no regional wall motion abnormalities. There is no left ventricular hypertrophy. Normal left atrial pressure. Right Ventricle: The right ventricular size is normal. No increase in right ventricular wall thickness. Global RV systolic function is has normal systolic function. Left Atrium: Left atrial size was normal in size. Right  Atrium: Right atrial size was normal in size Pericardium: There is no evidence of pericardial effusion. Mitral Valve: The mitral valve is normal in structure. No evidence of mitral valve regurgitation. No evidence of mitral valve stenosis by observation. Tricuspid Valve: The tricuspid valve is normal in structure. Tricuspid valve regurgitation is not demonstrated. Aortic Valve: The aortic valve is normal in structure. Aortic valve regurgitation is not visualized. The aortic valve is structurally normal, with no evidence of sclerosis or stenosis. Pulmonic Valve: The pulmonic valve was normal in structure. Pulmonic valve regurgitation is not visualized. Pulmonic regurgitation is not  visualized. Aorta: The aortic root, ascending aorta and aortic arch are all structurally normal, with no evidence of dilitation or obstruction. Venous: The inferior vena cava is normal in size with greater than 50% respiratory variability, suggesting right atrial pressure of 3 mmHg. IAS/Shunts: No atrial level shunt detected by color flow Doppler. There is no evidence of a patent foramen ovale. No ventricular septal defect is seen or detected. There is no evidence of an atrial septal defect.  LEFT VENTRICLE PLAX 2D LVIDd:         3.93 cm LV PW:         0.99 cm LV IVS:        0.58 cm  LEFT ATRIUM           Index       RIGHT ATRIUM          Index LA Vol (A4C): 16.0 ml 10.19 ml/m RA Area:     6.29 cm                                   RA Volume:   8.39 ml  5.34 ml/m  AORTIC VALVE LVOT Vmax:   62.10 cm/s LVOT Vmean:  39.100 cm/s LVOT VTI:    0.080 m  SHUNTS Systemic VTI: 0.08 m  Skeet Latch MD Electronically signed by Skeet Latch MD Signature Date/Time: 02/18/2019/6:41:17 PM    Final    Laurey Morale, MSN, NP-C [facility] [phone]  Attending neurologist's note to follow    02/19/2019, 9:27 AM  Attending addendum Patient seen and examined in the ICU Agree with the history and physical documented above that I have also independently obtained. Imaging reviewed by me personally-CT head after transfer to [facility] with no acute changes.   Overnight LTM EEG IMPRESSION: This studyshowed evidence of severe diffuse encephalopathy, nonspecific etiology but could be secondary to sedation. No seizures and epileptiform discharges were seen during this study.   Assessment:  19 year old female who overdosed on 35 to 45 pills of Lexapro and Wellbutrin.  On arrival to ED she was noted to have posturing and seizures.  While on the floor patient suffered from a 3-minute V. tach arrest with ROSC after 1 shock.   Initial concern for seizures, was most likely really myoclonus. EEG not  suggestive of any seizures.  Overnight EEG also unremarkable for seizure activity. Since last night, she has started to follow some commands and is more awake  Impression: -Severe toxic metabolic encephalopathy secondary to overdose of Lexapro and Wellbutrin -Likely serotonin syndrome -Less likely seizure and more likely myoclonus -Evaluate for possible hypoxic/ischemic brain injury due to cardiac arrest.  Recommendations: --continue LTM EEG for 1 more day and if no seizures seen, LTM can be discontinued tomorrow. --We will need an MRI at some point but will wait for LTM  to complete and do the MRI probably Saturday. -Depakote maintenance dose of 500 mg twice daily - due to national shortage of Depakote IV, switching to per tube. Check level, and again in the AM -Supportive care per critical care team   -- Amie Portland, MD [facility] Pager: [phone] If 7pm to 7am, please call on call as listed on AMION.   CRITICAL CARE ATTESTATION Performed by: Amie Portland, MD Total critical care time: 35 minutes Critical care time was exclusive of separately billable procedures and treating other patients and/or supervising APPs/Residents/Students Critical care was necessary to treat or prevent imminent or life-threatening deterioration due to drug overdose, severe toxic metabolic encephalopathy, concern for seizures. This patient is critically ill and at significant risk for neurological worsening and/or death and care requires constant monitoring. Critical care was time spent personally by me on the following activities: development of treatment plan with patient and/or surrogate as well as nursing, discussions with consultants, evaluation of patient's response to treatment, examination of patient, obtaining history from patient or surrogate, ordering and performing treatments and interventions, ordering and review of laboratory studies, ordering and review of radiographic studies,  pulse oximetry, re-evaluation of patient's condition, participation in multidisciplinary rounds and medical decision making of high complexity in the care of this patient.

## 2019-02-19 NOTE — Progress Notes (Signed)
Nutrition Follow-up  DOCUMENTATION CODES:   Non-severe (moderate) malnutrition in context of chronic illness, Underweight  INTERVENTION:   Initiate Pivot 1.5 @ 45 ml/hr (1080 ml/day) via OG tube  Provides: 1620 kcal, 101 grams protein, and 819 ml free water.    NUTRITION DIAGNOSIS:   Moderate Malnutrition related to chronic illness(depression) as evidenced by moderate fat depletion, moderate muscle depletion.  Ongoing   GOAL:   Patient will meet greater than or equal to 90% of their needs  Progressing   MONITOR:   Vent status, Labs, Weight trends  REASON FOR ASSESSMENT:   Consult, Ventilator Enteral/tube feeding initiation and management  ASSESSMENT:   19 y.o. female with PMH of anxiety, depression, and suicidal ideation. She presented to the ED after intentional OD (35-45 pills of 20 mg Lexapro and 35-45 pills of 300 mg Wellbutrin XL). Pt intubated then had v tach arrest x/p CPR.  Pt discussed during ICU rounds and with RN.  Pt on continuous EEG, no seizure activity noted  Patient is currently intubated on ventilator support MV: 11.4 L/min Temp (24hrs), Avg:99.5 F (37.5 C), Min:98.4 F (36.9 C), Max:99.9 F (37.7 C)   Medications reviewed Labs reviewed 71 F OG tube    Diet Order:   Diet Order            Diet NPO time specified  Diet effective now              EDUCATION NEEDS:   No education needs have been identified at this time  Skin:  Skin Assessment: Reviewed RN Assessment  Last BM:  PTA/unable  Height:   Ht Readings from Last 1 Encounters:  02/17/19 5' 6.5" (1.689 m)    Weight:   Wt Readings from Last 1 Encounters:  02/19/19 50 kg    Ideal Body Weight:  60.2 kg  BMI:  Body mass index is 17.53 kg/m.  Estimated Nutritional Needs:   Kcal:  1649 kcal  Protein:  91-101 grams (1.8-2 grams/kg)  Fluid:  >/= 1.8 L/day  Kendell Bane RD, LDN, CNSC [phone] Pager [phone] After Hours Pager

## 2019-02-19 NOTE — Procedures (Addendum)
Patient Name: Molly Callahan  MRN: [id number]  Epilepsy Attending: Charlsie Quest  Referring Physician/Provider: Dr Milon Dikes Duration: 02/18/2019 1511 to 02/19/2019 1511  Patient history: 19 year old female presented after suicidal overdose with Lexapro, had cardiac arrest, ROSC achieved after 3 minutes and now having whole-body jerking episodes.  EEG to evaluate for seizures.  Level of alertness: awake, sedated  AEDs during EEG study: Versed, VPA  Technical aspects: This EEG study was done with scalp electrodes positioned according to the 10-20 International system of electrode placement. Electrical activity was acquired at a sampling rate of 500Hz  and reviewed with a high frequency filter of 70Hz  and a low frequency filter of 1Hz . EEG data were recorded continuously and digitally stored.   Description: EEG showed continuous generalized 2-3Hz  delta slowing with an excessive amount of 15 to 18 Hz, 2-3 uV beta activity with irregular morphology distributed symmetrically and diffusely. Hyperventilation and photic stimulation were not performed due to ams.  Event button was pressed on 02/18/2019 at 1627. On video patient was noted to have whole body movement and cough which appeared voluntary after patient was stimulated. Concomitant EEG before during and after the event didn't show any eeg change to suggest seizure.  Event button was pressed on 02/18/2019 at 1804 and 1806 for unclear reasons. Concomitant EEG before during and after the event didn't show any eeg change to suggest seizure.  Abnormality -Continuous slow, generalized -Excessive beta, generalized  IMPRESSION: This study showed evidence of severe diffuse encephalopathy, nonspecific etiology but could be secondary to sedation. No seizures and epileptiform discharges were seen during this study.  Event button was pressed on 02/18/2019 at 1627, 1804 and 1806 as described above without EEG change and therefore were most likely  non epileptic.      [name] [id number]

## 2019-02-19 NOTE — Progress Notes (Signed)
Cardiology Progress Note  Patient ID: Molly Callahan MRN: [id number] DOB: [birth year] Date of Encounter: 02/19/2019  Primary Cardiologist: No primary care provider on file.  Subjective  No further episodes of SVT or ventricular tachycardia.  Most recent EKG demonstrates sinus tachycardia with heart rate 118, QRS 90 be 451.  I did confirm this visually.  This also was confirmed by manual measurement.  She is still ventilated but off pressors.  She will move all extremities spontaneously.  ROS:  All other ROS reviewed and negative. Pertinent positives noted in the HPI.     Inpatient Medications  Scheduled Meds: . calcium chloride  1 g Intravenous Once  . chlorhexidine gluconate (MEDLINE KIT)  15 mL Mouth Rinse BID  . Chlorhexidine Gluconate Cloth  6 each Topical Daily  . mouth rinse  15 mL Mouth Rinse 10 times per day  . sodium chloride flush  10-40 mL Intracatheter Q12H   Continuous Infusions: . sodium chloride 10 mL/hr at 02/19/19 0700  . ampicillin-sulbactam (UNASYN) IV 1.5 g (02/19/19 0401)  . isoproterenol (ISUPREL) infusion 2 mcg/min (02/19/19 0700)  . lactated ringers 125 mL/hr at 02/19/19 0700  . midazolam 10 mg/hr (02/19/19 0700)  . norepinephrine (LEVOPHED) Adult infusion Stopped (02/18/19 0955)  . phenylephrine (NEO-SYNEPHRINE) Adult infusion Stopped (02/19/19 0558)  . valproate sodium Stopped (02/18/19 2203)   PRN Meds: sodium chloride, fentaNYL (SUBLIMAZE) injection, fentaNYL (SUBLIMAZE) injection, midazolam, midazolam, midazolam, sodium chloride flush   Vital Signs   Vitals:   02/19/19 0400 02/19/19 0500 02/19/19 0600 02/19/19 0700  BP: 115/61 110/67 101/60 112/66  Pulse: (!) 119 (!) 118 (!) 118 (!) 121  Resp: (!) 26 (!) 26 (!) 26 (!) 22  Temp:   99.1 F (37.3 C) 99.1 F (37.3 C)  TempSrc:      SpO2: 99% 100% 100% 99%  Weight:  50 kg    Height:        Intake/Output Summary (Last 24 hours) at 02/19/2019 0752 Last data filed at 02/19/2019 0736 Gross per 24  hour  Intake 5568.63 ml  Output 3125 ml  Net 2443.63 ml   Last 3 Weights 02/19/2019 02/18/2019 10/23/2018  Weight (lbs) 110 lb 3.7 oz 111 lb 12.4 oz 110 lb 6.4 oz  Weight (kg) 50 kg 50.7 kg 50.077 kg  Some encounter information is confidential and restricted. Go to Review Flowsheets activity to see all data.      Telemetry  Overnight telemetry shows sinus tachycardia with heart rate around 120, which I personally reviewed.   ECG  The most recent ECG shows sinus tachycardia heart rate 118, normal intervals, QTC 451, which I personally reviewed.   Physical Exam   Vitals:   02/19/19 0400 02/19/19 0500 02/19/19 0600 02/19/19 0700  BP: 115/61 110/67 101/60 112/66  Pulse: (!) 119 (!) 118 (!) 118 (!) 121  Resp: (!) 26 (!) 26 (!) 26 (!) 22  Temp:   99.1 F (37.3 C) 99.1 F (37.3 C)  TempSrc:      SpO2: 99% 100% 100% 99%  Weight:  50 kg    Height:         Intake/Output Summary (Last 24 hours) at 02/19/2019 0752 Last data filed at 02/19/2019 0736 Gross per 24 hour  Intake 5568.63 ml  Output 3125 ml  Net 2443.63 ml    Last 3 Weights 02/19/2019 02/18/2019 10/23/2018  Weight (lbs) 110 lb 3.7 oz 111 lb 12.4 oz 110 lb 6.4 oz  Weight (kg) 50 kg 50.7 kg 50.077 kg  Some encounter information is confidential and restricted. Go to Review Flowsheets activity to see all data.    Body mass index is 17.53 kg/m.  General: Intubated on vent, will move all extremities Head: Atraumatic, normal size  Eyes: PEERLA, EOMI  Neck: Supple, no JVD Endocrine: No thryomegaly Cardiac: Normal S1, S2; RRR; no murmurs, rubs, or gallops Lungs: Diffuse rhonchi bilaterally Abd: Soft, nontender, no hepatomegaly  Ext: No edema, pulses 2+ Musculoskeletal: No deformities, BUE and BLE strength normal and equal Skin: Warm and dry, no rashes   Neuro: Intubated/sedated on vent  Labs  High Sensitivity Troponin:  No results for input(s): TROPONINIHS in the last 720 hours.   Cardiac EnzymesNo results for input(s):  TROPONINI in the last 168 hours. No results for input(s): TROPIPOC in the last 168 hours.  Chemistry Recent Labs  Lab 02/17/19 2113 02/18/19 0637 02/18/19 0941  NA 141 141 140  K 3.9 3.3* 4.4  CL 109 112* 114*  CO2 18* 21* 19*  GLUCOSE 101* 122* 127*  BUN _0 CREATININE 0.83 1.12* 1.20*  CALCIUM 8.5* 7.9* 8.4*  PROT 6.9 5.9*  --   ALBUMIN 4.1 3.5  --   AST 17 19  --   ALT 14 10  --   ALKPHOS 47 43  --   BILITOT 1.3* 1.1  --   GFRNONAA >60 >60 >60  GFRAA >60 >60 >60  ANIONGAP _1 Hematology Recent Labs  Lab 02/17/19 2113 02/18/19 0225 02/19/19 0522  WBC 9.2 14.5* 15.8*  RBC 4.31 3.98 3.32*  HGB 12.7 11.7* 9.8*  HCT 38.8 36.5 31.0*  MCV 90.0 91.7 93.4  MCH 29.5 29.4 29.5  MCHC 32.7 32.1 31.6  RDW 12.8 13.0 13.8  PLT 229 197 148*   BNP Recent Labs  Lab 02/18/19 0637  BNP 37.9    DDimer No results for input(s): DDIMER in the last 168 hours.   Radiology  DG Abdomen 1 View  Result Date: 02/17/2019 CLINICAL DATA:  OG tube EXAM: ABDOMEN - 1 VIEW COMPARISON:  None. FINDINGS: Esophageal tube tip overlies the gastric body. Nonobstructed bowel-gas pattern. IMPRESSION: Esophageal tube tip overlies the gastric body Electronically Signed   By: Donavan Foil M.D.   On: 02/17/2019 22:14   CT HEAD WO CONTRAST  Result Date: 02/18/2019 CLINICAL DATA:  Seizure. EXAM: CT HEAD WITHOUT CONTRAST TECHNIQUE: Contiguous axial images were obtained from the base of the skull through the vertex without intravenous contrast. COMPARISON:  February 17, 2019 FINDINGS: Brain: No evidence of acute infarction, hemorrhage, hydrocephalus, extra-axial collection or mass lesion/mass effect. Vascular: No hyperdense vessel or unexpected calcification. Skull: Normal. Negative for fracture or focal lesion. Sinuses/Orbits: No acute finding. Other: None. IMPRESSION: No acute intracranial pathology. Electronically Signed   By: Virgina Norfolk M.D.   On: 02/18/2019 15:26   CT Head Wo  Contrast  Result Date: 02/17/2019 CLINICAL DATA:  19 year old female status post overdose, complaining of headache dizziness and nausea within subsequent seizure like activity. Combative. EXAM: CT HEAD WITHOUT CONTRAST TECHNIQUE: Contiguous axial images were obtained from the base of the skull through the vertex without intravenous contrast. COMPARISON:  None. FINDINGS: Brain: Normal cerebral volume. No midline shift, ventriculomegaly, mass effect, evidence of mass lesion, intracranial hemorrhage or evidence of cortically based acute infarction. Gray-white matter differentiation is within normal limits throughout the brain. Vascular: No suspicious intracranial vascular hyperdensity. Skull: Negative. Sinuses/Orbits: Visualized paranasal sinuses and mastoids are clear. Other: Visualized orbits and scalp soft  tissues are within normal limits. IMPRESSION: Normal noncontrast CT appearance of the brain. Electronically Signed   By: Genevie Ann M.D.   On: 02/17/2019 23:09   DG Chest Port 1 View  Result Date: 02/19/2019 CLINICAL DATA:  Aspiration pneumonia, ETT, OD EXAM: PORTABLE CHEST 1 VIEW COMPARISON:  Radiograph 02/18/2019 FINDINGS: Endotracheal tube terminates in the mid trachea, 5 cm from the carina. Left subclavian line tip terminates at the superior cavoatrial junction. Transesophageal tube tip and side port distal to the GE junction. Mixed interstitial and more consolidative perihilar opacity present throughout the right hemithorax with the left lung has largely cleared since comparison radiograph 1 day prior. Cardiomediastinal contours are stable. No acute osseous or soft tissue abnormality. IMPRESSION: Near complete resolution of the opacities in the left hemithorax likely reflecting resolving atelectasis Persistent opacity throughout the right lung may reflect sequela of aspiration and/or edema. Lines and tubes as above. Electronically Signed   By: Lovena Le M.D.   On: 02/19/2019 05:45   DG Chest Port 1  View  Result Date: 02/18/2019 CLINICAL DATA:  Central line placement EXAM: PORTABLE CHEST 1 VIEW COMPARISON:  None. FINDINGS: Left subclavian central venous catheter tip in the SVC near the cavoatrial junction. No pneumothorax. Endotracheal tube in good position. Bilateral infiltrates right greater than left mild interval improvement. No effusion. IMPRESSION: Central line tip SVC.  No pneumothorax. Mild improvement in bilateral infiltrates right greater than left. Electronically Signed   By: Franchot Gallo M.D.   On: 02/18/2019 09:34   DG Chest Port 1 View  Result Date: 02/18/2019 CLINICAL DATA:  COVID.  Low blood pressure. EXAM: PORTABLE CHEST 1 VIEW COMPARISON:  02/17/2019. FINDINGS: Endotracheal tube and NG tube in stable position. Heart size normal. Diffuse severe bilateral pulmonary infiltrates/edema, right side greater than left. No pleural effusion or pneumothorax. IMPRESSION: 1.  Endotracheal tube and NG tube stable position. 2. Diffuse severe bilateral pulmonary infiltrates/edema, right side greater than left noted on today's exam. Electronically Signed   By: Marcello Moores  Register   On: 02/18/2019 08:15   DG Chest Portable 1 View  Result Date: 02/17/2019 CLINICAL DATA:  Post intubation EXAM: PORTABLE CHEST 1 VIEW COMPARISON:  None. FINDINGS: Endotracheal tube tip is about 1.5 cm superior to the carina. Esophageal tube tip is below the diaphragm but non included. Heart size is normal. Lung fields are clear IMPRESSION: Endotracheal tube tip about 1.5 cm superior to the carina. Clear lung fields Electronically Signed   By: Donavan Foil M.D.   On: 02/17/2019 22:13   EEG adult  Result Date: 02/18/2019 Lora Havens, MD     02/18/2019  1:12 PM Patient Name: Molly Callahan MRN: [id number] Epilepsy Attending: Lora Havens Referring Physician/Provider: Dr Christinia Gully Date: 02/18/2019 Duration: 22.30 minutes Patient history: 19 year old female presented after suicidal overdose with Lexapro, had  cardiac arrest, ROSC achieved after 3 minutes and now having whole-body jerking episodes.  EEG to evaluate for seizures. Level of alertness: Comatose AEDs during EEG study: Versed Technical aspects: This EEG study was done with scalp electrodes positioned according to the 10-20 International system of electrode placement. Electrical activity was acquired at a sampling rate of _0  and reviewed with a high frequency filter of _1  and a low frequency filter of _2 . EEG data were recorded continuously and digitally stored. Description: At the onset EEG showed continuous generalized 2 to 3 Hz delta slowing.  At 10 AM, per EEG tech documentation patient had sudden eye opening and whole-body jerking.  Concomitant EEG showed generalized periodic epileptiform discharges. Three more similar events were captured at 10:01:01, 10:01:17 and 10:01:32. These events lasted 5-10 seconds and are consistent with myoclonic seizures. Between events EEG showed generalized EEG suppression lasting 3-6 seconds. Hyperventilation and photic stimulation were not performed due to ams. Abnormality -Myoclonic seizures, generalized -Continuous slow, generalized IMPRESSION: This study showed four myoclonic seizures characterized by whole body twitching and eye opening with generalized onset, lasting 5 to 10 seconds each.  Initial EEG showed evidence of severe diffuse encephalopathy, nonspecific etiology. Dr. Rory Percy was notified immediately.   ECHOCARDIOGRAM COMPLETE BUBBLE STUDY  Result Date: 02/18/2019   ECHOCARDIOGRAM REPORT   Patient Name:   Molly Callahan Date of Exam: 02/18/2019 Medical Rec #:  [id number]      Height:       66.5 in Accession #:    [id number]     Weight:       111.8 lb Date of Birth:  2000/11/22      BSA:          1.57 m Patient Age:    18 years       BP:           101/61 mmHg Patient Gender: F              HR:           123 bpm. Exam Location:  Inpatient Procedure: 2D Echo Indications:    cardiac arrest  History:         Patient has no prior history of Echocardiogram examinations.  Sonographer:    Jannett Celestine RDCS (AE) Referring Phys: [id number] Cassie Freer O'NEAL  Sonographer Comments: Echo performed with patient supine and on artificial respirator. Image acquisition challenging due to respiratory motion. restricted mobility IMPRESSIONS  1. Left ventricular ejection fraction, by visual estimation, is 60 to 65%. The left ventricle has normal function. There is no left ventricular hypertrophy.  2. The left ventricle has no regional wall motion abnormalities.  3. Global right ventricle has normal systolic function.The right ventricular size is normal. No increase in right ventricular wall thickness.  4. Left atrial size was normal.  5. Right atrial size was normal.  6. The mitral valve is normal in structure. No evidence of mitral valve regurgitation. No evidence of mitral stenosis.  7. The tricuspid valve is normal in structure.  8. The tricuspid valve is normal in structure. Tricuspid valve regurgitation is not demonstrated.  9. The aortic valve is normal in structure. Aortic valve regurgitation is not visualized. No evidence of aortic valve sclerosis or stenosis. 10. The pulmonic valve was normal in structure. Pulmonic valve regurgitation is not visualized. 11. The inferior vena cava is normal in size with greater than 50% respiratory variability, suggesting right atrial pressure of 3 mmHg. FINDINGS  Left Ventricle: Left ventricular ejection fraction, by visual estimation, is 60 to 65%. The left ventricle has normal function. The left ventricle has no regional wall motion abnormalities. There is no left ventricular hypertrophy. Normal left atrial pressure. Right Ventricle: The right ventricular size is normal. No increase in right ventricular wall thickness. Global RV systolic function is has normal systolic function. Left Atrium: Left atrial size was normal in size. Right Atrium: Right atrial size was normal in size Pericardium:  There is no evidence of pericardial effusion. Mitral Valve: The mitral valve is normal in structure. No evidence of mitral valve regurgitation. No evidence of mitral valve stenosis by observation. Tricuspid Valve: The tricuspid valve is  normal in structure. Tricuspid valve regurgitation is not demonstrated. Aortic Valve: The aortic valve is normal in structure. Aortic valve regurgitation is not visualized. The aortic valve is structurally normal, with no evidence of sclerosis or stenosis. Pulmonic Valve: The pulmonic valve was normal in structure. Pulmonic valve regurgitation is not visualized. Pulmonic regurgitation is not visualized. Aorta: The aortic root, ascending aorta and aortic arch are all structurally normal, with no evidence of dilitation or obstruction. Venous: The inferior vena cava is normal in size with greater than 50% respiratory variability, suggesting right atrial pressure of 3 mmHg. IAS/Shunts: No atrial level shunt detected by color flow Doppler. There is no evidence of a patent foramen ovale. No ventricular septal defect is seen or detected. There is no evidence of an atrial septal defect.  LEFT VENTRICLE PLAX 2D LVIDd:         3.93 cm LV PW:         0.99 cm LV IVS:        0.58 cm  LEFT ATRIUM           Index       RIGHT ATRIUM          Index LA Vol (A4C): 16.0 ml 10.19 ml/m RA Area:     6.29 cm                                   RA Volume:   8.39 ml  5.34 ml/m  AORTIC VALVE LVOT Vmax:   62.10 cm/s LVOT Vmean:  39.100 cm/s LVOT VTI:    0.080 m  SHUNTS Systemic VTI: 0.08 m  Skeet Latch MD Electronically signed by Skeet Latch MD Signature Date/Time: 02/18/2019/6:41:17 PM    Final     Cardiac Studies  TTE 02/18/2019  1. Left ventricular ejection fraction, by visual estimation, is 60 to 65%. The left ventricle has normal function. There is no left ventricular hypertrophy.  2. The left ventricle has no regional wall motion abnormalities.  3. Global right ventricle has normal  systolic function.The right ventricular size is normal. No increase in right ventricular wall thickness.  4. Left atrial size was normal.  5. Right atrial size was normal.  6. The mitral valve is normal in structure. No evidence of mitral valve regurgitation. No evidence of mitral stenosis.  7. The tricuspid valve is normal in structure.  8. The tricuspid valve is normal in structure. Tricuspid valve regurgitation is not demonstrated.  9. The aortic valve is normal in structure. Aortic valve regurgitation is not visualized. No evidence of aortic valve sclerosis or stenosis. 10. The pulmonic valve was normal in structure. Pulmonic valve regurgitation is not visualized. 11. The inferior vena cava is normal in size with greater than 50% respiratory variability, suggesting right atrial pressure of 3 mmHg.  Patient Profile  Ms. Spagna is a 19 year old female with history of anxiety/depression who was admitted overnight for overdose of antidepressant agents.  Course has been complicated by VT arrest in setting of prolonged QT.  Assessment & Plan   VT arrest in setting of QTc prolongation -She intentionally took at least 30 tablets of her Wellbutrin and Lexapro.  QTC in the emergency room was 635.  She was noted to have sinus tachycardia at time.  Apparently around 6:50 AM 02/18/2019 she was noted to have VT arrest.  I did review the strips.  This shows a monomorphic VT which appears to  also have some polymorphic component to it.  She interestingly had this rhythm when she was already tachycardic.  Since that time she is had SVT and intermittent sinus tachycardia.  She is also being evaluated for seizure-like activity as well as encephalopathy by the critical care medicine team.  There is also concerns for aspiration pneumonia. -1/29: No further arrhythmias.  Her telemetry shows sinus tachycardia with heart rate of 120. -The treatment for her arrhythmia in the setting of QTC prolongation is mainly  supportive.  She was on Isopril to maintain a heart rate above 100 to shorten her QTC.  Her EKG this morning shows a QTC of 451 with sinus tachycardia. -Would recommend to turn off Isopril and obtain an EKG.  As long as her QTC is less than 500 this should be okay.  Should she have further arrhythmias we would need to put her back on Isopril with target heart rate greater than 100.  The whole idea is to allow for her antidepressants to washout as these are the culprit agents for her QTC prolongation as well as arrhythmias. -Echocardiogram demonstrates normal left ventricular function without any regional wall motion normalities. -Her ventricular tachycardia arrest secondary to QTC prolongation in the setting of drug use.  This does not merit ICD implantation.  Given her young age, I would not pursue an ischemia evaluation either. -Would avoid any QTC prolonging agents.  Clearly she will need psychiatric consultation when she is at ICU.  SVT -On 02/18/2019 she was having multiple SVTs and nonsustained VT.  I suspect this is all due to her hyperadrenergic state in the setting of drug ingestion.  All of her arrhythmias have calmed down.  She is simply in sinus tachycardia.    For questions or updates, please contact [place] Please consult [email or web address] for contact info under   Signed, [place] T. Audie Box, [place]  02/19/2019 7:52 AM

## 2019-02-20 ENCOUNTER — Other Ambulatory Visit: Payer: Self-pay

## 2019-02-20 ENCOUNTER — Inpatient Hospital Stay (HOSPITAL_COMMUNITY): Payer: BC Managed Care – PPO

## 2019-02-20 DIAGNOSIS — T50901A Poisoning by unspecified drugs, medicaments and biological substances, accidental (unintentional), initial encounter: Secondary | ICD-10-CM | POA: Diagnosis not present

## 2019-02-20 DIAGNOSIS — G9340 Encephalopathy, unspecified: Secondary | ICD-10-CM | POA: Diagnosis not present

## 2019-02-20 DIAGNOSIS — J690 Pneumonitis due to inhalation of food and vomit: Secondary | ICD-10-CM | POA: Diagnosis not present

## 2019-02-20 DIAGNOSIS — J9811 Atelectasis: Secondary | ICD-10-CM | POA: Diagnosis not present

## 2019-02-20 DIAGNOSIS — J9601 Acute respiratory failure with hypoxia: Secondary | ICD-10-CM | POA: Diagnosis not present

## 2019-02-20 DIAGNOSIS — T43292A Poisoning by other antidepressants, intentional self-harm, initial encounter: Secondary | ICD-10-CM | POA: Diagnosis not present

## 2019-02-20 DIAGNOSIS — G2579 Other drug induced movement disorders: Secondary | ICD-10-CM | POA: Diagnosis not present

## 2019-02-20 DIAGNOSIS — Z9911 Dependence on respirator [ventilator] status: Secondary | ICD-10-CM | POA: Diagnosis not present

## 2019-02-20 DIAGNOSIS — R569 Unspecified convulsions: Secondary | ICD-10-CM | POA: Diagnosis not present

## 2019-02-20 DIAGNOSIS — G92 Toxic encephalopathy: Secondary | ICD-10-CM | POA: Diagnosis not present

## 2019-02-20 LAB — HEPATIC FUNCTION PANEL
ALT: 56 U/L — ABNORMAL HIGH (ref 0–44)
AST: 37 U/L (ref 15–41)
Albumin: 2.3 g/dL — ABNORMAL LOW (ref 3.5–5.0)
Alkaline Phosphatase: 52 U/L (ref 38–126)
Bilirubin, Direct: 0.2 mg/dL (ref 0.0–0.2)
Indirect Bilirubin: 0.5 mg/dL (ref 0.3–0.9)
Total Bilirubin: 0.7 mg/dL (ref 0.3–1.2)
Total Protein: 4.5 g/dL — ABNORMAL LOW (ref 6.5–8.1)

## 2019-02-20 LAB — CBC WITH DIFFERENTIAL/PLATELET
Abs Immature Granulocytes: 0.05 10*3/uL (ref 0.00–0.07)
Basophils Absolute: 0 10*3/uL (ref 0.0–0.1)
Basophils Relative: 0 %
Eosinophils Absolute: 0.2 10*3/uL (ref 0.0–0.5)
Eosinophils Relative: 2 %
HCT: 26.4 % — ABNORMAL LOW (ref 36.0–46.0)
Hemoglobin: 8.6 g/dL — ABNORMAL LOW (ref 12.0–15.0)
Immature Granulocytes: 1 %
Lymphocytes Relative: 11 %
Lymphs Abs: 0.8 10*3/uL (ref 0.7–4.0)
MCH: 29.3 pg (ref 26.0–34.0)
MCHC: 32.6 g/dL (ref 30.0–36.0)
MCV: 89.8 fL (ref 80.0–100.0)
Monocytes Absolute: 0.5 10*3/uL (ref 0.1–1.0)
Monocytes Relative: 6 %
Neutro Abs: 6 10*3/uL (ref 1.7–7.7)
Neutrophils Relative %: 80 %
Platelets: 121 10*3/uL — ABNORMAL LOW (ref 150–400)
RBC: 2.94 MIL/uL — ABNORMAL LOW (ref 3.87–5.11)
RDW: 14 % (ref 11.5–15.5)
WBC: 7.5 10*3/uL (ref 4.0–10.5)
nRBC: 0 % (ref 0.0–0.2)

## 2019-02-20 LAB — BASIC METABOLIC PANEL
Anion gap: 6 (ref 5–15)
BUN: 13 mg/dL (ref 6–20)
CO2: 23 mmol/L (ref 22–32)
Calcium: 8.2 mg/dL — ABNORMAL LOW (ref 8.9–10.3)
Chloride: 111 mmol/L (ref 98–111)
Creatinine, Ser: 0.86 mg/dL (ref 0.44–1.00)
GFR calc Af Amer: >60 mL/min (ref >60)
GFR calc non Af Amer: >60 mL/min (ref >60)
Glucose, Bld: 127 mg/dL — ABNORMAL HIGH (ref 70–99)
Potassium: 3.8 mmol/L (ref 3.5–5.1)
Sodium: 140 mmol/L (ref 135–145)

## 2019-02-20 LAB — LACTIC ACID, PLASMA: Lactic Acid, Venous: 1.7 mmol/L (ref 0.5–1.9)

## 2019-02-20 LAB — MAGNESIUM
Magnesium: 1.9 mg/dL (ref 1.7–2.4)
Magnesium: 1.8 mg/dL (ref 1.7–2.4)
Magnesium: 2 mg/dL (ref 1.7–2.4)

## 2019-02-20 LAB — GLUCOSE, CAPILLARY
Glucose-Capillary: 112 mg/dL — ABNORMAL HIGH (ref 70–99)
Glucose-Capillary: 112 mg/dL — ABNORMAL HIGH (ref 70–99)
Glucose-Capillary: 116 mg/dL — ABNORMAL HIGH (ref 70–99)
Glucose-Capillary: 116 mg/dL — ABNORMAL HIGH (ref 70–99)
Glucose-Capillary: 121 mg/dL — ABNORMAL HIGH (ref 70–99)
Glucose-Capillary: 122 mg/dL — ABNORMAL HIGH (ref 70–99)

## 2019-02-20 LAB — PROTIME-INR
INR: 1.4 — ABNORMAL HIGH (ref 0.8–1.2)
Prothrombin Time: 17.4 seconds — ABNORMAL HIGH (ref 11.4–15.2)

## 2019-02-20 LAB — PROCALCITONIN: Procalcitonin: 0.94 ng/mL

## 2019-02-20 LAB — TROPONIN I (HIGH SENSITIVITY): Troponin I (High Sensitivity): 11 ng/L (ref <18)

## 2019-02-20 LAB — PHOSPHORUS
Phosphorus: 1.7 mg/dL — ABNORMAL LOW (ref 2.5–4.6)
Phosphorus: 2 mg/dL — ABNORMAL LOW (ref 2.5–4.6)

## 2019-02-20 LAB — CALCIUM, IONIZED: Calcium, Ionized, Serum: 4.9 mg/dL (ref 4.5–5.6)

## 2019-02-20 MED ORDER — MAGNESIUM SULFATE IN D5W 1-5 GM/100ML-% IV SOLN
1.0000 g | Freq: Once | INTRAVENOUS | Status: AC
  Administered 2019-02-20: 1 g via INTRAVENOUS
  Filled 2019-02-20: qty 100

## 2019-02-20 MED ORDER — POTASSIUM PHOSPHATES 15 MMOLE/5ML IV SOLN
30.0000 mmol | Freq: Once | INTRAVENOUS | Status: AC
  Administered 2019-02-20: 30 mmol via INTRAVENOUS
  Filled 2019-02-20: qty 10

## 2019-02-20 MED ORDER — MAGNESIUM SULFATE 50 % IJ SOLN: 1.0000 g | Freq: Once | INTRAMUSCULAR | Status: DC

## 2019-02-20 NOTE — Progress Notes (Signed)
Dr Darnelle Bos rounding note reviewed. Patient with VT arrest after presenting with intentional severe overdose of wellbutrin and lexapro leading to prolonged QT and VT. Transiently on isopril to increase HR and shorten QT, now off. QTc this AM is 460. Normal echo. No further cardiology recs at this time, if recurrent arrhythmias call us back. No indication for outpatient cardiology f/u.   Dina Rich MD

## 2019-02-20 NOTE — Progress Notes (Signed)
NAME:  Molly Callahan, MRN:  [id number], DOB:  05/31/2000, LOS: 3 ADMISSION DATE:  02/17/2019, CONSULTATION DATE:  02/17/2019 REFERRING MD:  Ed physician, CHIEF COMPLAINT:  Drug overdose   Brief History   History available from chart Intentional drug overdose 35 to 45 pills of Lexapro and Wellbutrin around 7:30 PM Was complaining of nausea headache and dizziness Posturing in the ED with seizures  History of depression, anxiety, past history of suicidal ideation  Significant Hospital Events   1/27 Seizures in the ED; intubated, Poison control notified, given activated charcoal.  1/28: QTC prolonged.  Became progressively widened up to 700 cardiology consulted who recommended IV beta-blockade. Intermittently having seizure like activity, initially starting with symmetric facial twitching which increased in intensity and was associated with bilateral arm flexion which was not rhythmic.  Became hypotensive requiring norepinephrine, at 6:57 AM developed V. tach arrest.  CPR lasted less than 3 minutes was defibrillated, given antiarrhythmic therapy, demonstrated successful return of spontaneous circulation.  Neurology consulted, loaded with Depakote.  Spiking fever, , White blood cell count rising, chest x-ray with significant right greater than left airspace disease raising concern for aspiration, Unasyn started. Remains unresponsive, having intermittent seizure-like activity   1/28 - poiston control advice to [employer] Group[employer]ete Callahan  -  Recommendations from their stand-point remain supportive. -treat seizures or myoclonus activity ->typical onset is usually at the 18 hr point and she is following this trend. Could last for extended time frame (days).  -no role for HD (discussed specifically) -continue current supportive care -watch QT closely             Maximize K and Mg (keep high nml)                         Cardiology has added isuprel -if she were to have another cardiac arrest poison control  recommends intravenous fat emulsion therapy (see 02/18/2019 notes of P Callahan) No role for empirically administering however    Consults:  PCCM  Procedures:  Tracheal intubation 02/17/2019  Significant Diagnostic Tests:  CT head 02/17/2019-no significant abnormality  Micro Data:  Respiratory culture 1/28>>> Antimicrobials:  Unasyn 1/28>> Interim history/subjective:    02/20/2019 - per cards QTc has normalized and stopped isoproterenol 1/29. AICD not indicated. Now in sinus. Off neo. On LR gtt and versed gtt. EEG wtihout seizure 1/29 per Neuro-EP. Per neuro notes - poison control recommended supportive care  Tmax 100.15F/ WBC 15K-->7.5  Objective   Blood pressure (!) 142/75, pulse 95, temperature 99.5 F (37.5 C), resp. rate (!) 28, height 5' 6.5" (1.689 m), weight 50 kg, SpO2 95 %. CVP:  [10 mmHg] 10 mmHg  Vent Mode: PRVC FiO2 (%):  [40 %] 40 % Set Rate:  [26 bmp] 26 bmp Vt Set:  [400 mL] 400 mL PEEP:  [5 cmH20] 5 cmH20 Pressure Support:  [10 cmH20] 10 cmH20 Plateau Pressure:  [15 cmH20-23 cmH20] 15 cmH20   Intake/Output Summary (Last 24 hours) at 02/20/2019 1114 Last data filed at 02/20/2019 1009 Gross per 24 hour  Intake 4296.08 ml  Output 2800 ml  Net 1496.08 ml   Filed Weights   02/18/19 0200 02/19/19 0500  Weight: 50.7 kg 50 kg    Examination: General: thin adult F. Critically ill appearing, intubated, sedated, intermittently moving  HEENT: [place]/AT pink mmm  Trachea midline ETT secure  Pulmonary: CTA. Initiating breaths spontaneously when awakened, vent rate at 26 Cardiac: sinus tach. No r/m/g Abdomen: soft flat  ndnt + bowel sounds  Neuro: opens eyes to stim.  Increased muscle tone. 3 beat clonus in ankles. Grimaces, withdraws, then awakens to noxious stimuli and moving purposefully. Extremities: BUE wrist restraints.  No cyanosis or clubbing, 2+ radial, DP, PT pulses GU: clear yellow   Resolved Hospital Problem list     Assessment & Plan:   Serotonin  Syndrome Intentional drug overdose -Background history of anxiety/depression -Received activated charcoal  02/20/2019 - clonus,   Plan Poison control involved; if decompensates will need fat emulsion. See Molly Callahan's note 02/18/2019  for further details Liberal use of BZD for sedation Mag goal >2 K >2 QTc monigoring If febrile use cool fluids, ice packs. APAP will not be beneficial in this instance     Seizure versus myoclonus - -likely in setting of serotonin syndrome. Anoxia from VT arrest is less favored due to neuro exam improvement   Plan Appreciate neuro following.  EEG off--no apparent sz activity (?) MRI brain today per neurology AED per neuro Seizure precautions Status post VT arrest in the setting of prolonged QTC - -Time to ROSC less than 3 minutes  02/20/2019 - QTc 450 msec and cards stopped  Isoprotenrol. Off pressors  Plan Avoid b-blocker QTc check - 9am, 3pm, 10pm 02/19/2019 and 6am 02/20/2019 Avoiding QTC prolonging medications (no prop, no precedex)    Electrolyte imbalance  Plan  - Mag goal > 2 - K goal > 4 - replete phos  -serial labs   Circulatory shock    - -possible component of sepsis with likely aspiration PNA   resolved and off pressors  Plan MAP goal > 65 Continue LR at 125cc/hr  Acute hypoxic respiratory failure in the setting of diffuse bilateral right greater than left pulmonary infiltrates: - likely represents aspiration pneumonia - -Seems to be PEEP responsive thus far. -High risk for evolving ARDS   Plan PRVC pusle ox goal > 88% VAP bundle  AKI, mild  - creat 1.2 on 1/28  Plan Recheck bmet 1/29 MAP goal > 65  Continue fluid rate LR 125/cchh Strict I/O No role for dialysis, specifically discussed with poison control  Trend renal indices   Anemia critical illness   - no active bleeding  Plan  - - PRBC for hgb </= 6.9gm%    - exceptions are   -  if ACS susepcted/confirmed then transfuse for hgb </= 8.0gm%,  or     -  active bleeding with hemodynamic instability, then transfuse regardless of hemoglobin value   At at all times try to transfuse 1 unit prbc as possible with exception of active hemorrhage     MDD with SI S/p intentional OD of antidepressant Rxs   Plan Will need psych when clinically improved  Best practice:  Diet: NPO Pain/Anxiety/Delirium protocol (if indicated): FVersed, PRN fent  VAP protocol (if indicated): In place DVT prophylaxis: In place GI prophylaxis:  Glucose control: Monitor Mobility: Bedrest Code Status: Full code Family Communication: Mom and dad updated over phone 02/19/2019  Disposition: ICU   Global: improving serotonin syndrome  LABS    PULMONARY Recent Labs  Lab 02/18/19 0023 02/18/19 0410 02/18/19 0800 02/18/19 1000 02/19/19 0822  PHART 7.346* 7.353 7.195* 7.241* 7.413  PCO2ART 39.9 39.4 50.7* 43.8 34.1  PO2ART 288* 164* 196* 157* 134.0*  HCO3 21.4 21.3 18.8* 17.7* 21.6  TCO2  --   --   --   --  23  O2SAT 99.5 99.3 98.9 98.6 99.0    CBC Recent Labs  Lab 02/18/19 0225 02/18/19 0225 02/19/19 0522 02/19/19 0822 02/20/19 0406  HGB 11.7*   < > 9.8* 8.8* 8.6*  HCT 36.5   < > 31.0* 26.0* 26.4*  WBC 14.5*  --  15.8*  --  7.5  PLT 197  --  148*  --  121*   < > = values in this interval not displayed.    COAGULATION Recent Labs  Lab 02/19/19 0832 02/20/19 0406  INR 1.6* 1.4*    CARDIAC  No results for input(s): TROPONINI in the last 168 hours. No results for input(s): PROBNP in the last 168 hours.   CHEMISTRY Recent Labs  Lab 02/17/19 2113 02/18/19 0225 02/18/19 5462 02/18/19 7035 02/18/19 0941 02/18/19 0941 02/18/19 1654 02/18/19 2240 02/18/19 2240 02/19/19 0522 02/19/19 0093 02/19/19 0822 02/19/19 0832 02/19/19 1654 02/19/19 2240 02/20/19 0406  NA 141  --  141  --  140  --   --   --   --   --  145  --  142  --  140  --   K 3.9  --  3.3*   < > 4.4   < >  --   --   --   --  4.3   < > 4.2  --  3.8  --   CL  109  --  112*  --  114*  --   --   --   --   --   --   --  113*  --  111  --   CO2 18*  --  21*  --  19*  --   --   --   --   --   --   --  21*  --  23  --   GLUCOSE 101*  --  122*  --  127*  --   --   --   --   --   --   --  85  --  127*  --   BUN 7  --  10  --  9  --   --   --   --   --   --   --  9  --  13  --   CREATININE 0.83  --  1.12*  --  1.20*  --   --   --   --   --   --   --  1.12*  --  0.86  --   CALCIUM 8.5*  --  7.9*  --  8.4*  --   --   --   --   --   --   --  8.5*  --  8.2*  --   MG  --    < > 2.8*  --   --   --    < > 2.0   < > 2.0  --   --  2.0 1.9 1.9 1.8  PHOS  --    < >  --   --   --   --    < > 4.4  --  4.2  --   --  3.3 3.3  --  1.7*   < > = values in this interval not displayed.   Estimated Creatinine Clearance: 82.4 mL/min (by C-G formula based on SCr of 0.86 mg/dL).   LIVER Recent Labs  Lab 02/17/19 2113 02/18/19 0637 02/19/19 0832 02/20/19 0406  AST 17 19  --  37  ALT 14 10  --  56*  ALKPHOS 47 43  --  52  BILITOT 1.3* 1.1  --  0.7  PROT 6.9 5.9*  --  4.5*  ALBUMIN 4.1 3.5  --  2.3*  INR  --   --  1.6* 1.4*     INFECTIOUS Recent Labs  Lab 02/18/19 0941 02/18/19 1032 02/19/19 0832 02/20/19 0000 02/20/19 0406  LATICACIDVEN 3.1* 3.6*  --  1.7  --   PROCALCITON  --   --  1.35  --  0.94     ENDOCRINE CBG (last 3)  Recent Labs    02/19/19 2340 02/20/19 0327 02/20/19 0825  GLUCAP 111* 112* 116*         IMAGING x48h  - image(s) personally visualized  -   highlighted in bold CT HEAD WO CONTRAST  Result Date: 02/18/2019 CLINICAL DATA:  Seizure. EXAM: CT HEAD WITHOUT CONTRAST TECHNIQUE: Contiguous axial images were obtained from the base of the skull through the vertex without intravenous contrast. COMPARISON:  February 17, 2019 FINDINGS: Brain: No evidence of acute infarction, hemorrhage, hydrocephalus, extra-axial collection or mass lesion/mass effect. Vascular: No hyperdense vessel or unexpected calcification. Skull: Normal. Negative for  fracture or focal lesion. Sinuses/Orbits: No acute finding. Other: None. IMPRESSION: No acute intracranial pathology. Electronically Signed   By: Aram Candela M.D.   On: 02/18/2019 15:26   DG CHEST PORT 1 VIEW  Result Date: 02/20/2019 CLINICAL DATA:  Followup ventilator support. EXAM: PORTABLE CHEST 1 VIEW COMPARISON:  02/19/2019 FINDINGS: Endotracheal tube 6 cm above the carina. Orogastric or nasogastric 2 enters the abdomen. Left subclavian central line tip at the SVC RA junction. Left chest shows only mild atelectasis at the left base. On the right, there is increasing density apparently due to accumulating pleural fluid as well as right lower lung atelectasis/infiltrate. IMPRESSION: Lines and tubes well position. Improvement on the left with only mild atelectasis at the left base. Worsened appearance on the right with accumulating pleural fluid and worsened right lower lung volume loss/infiltrate. Electronically Signed   By: Paulina Fusi M.D.   On: 02/20/2019 06:52   DG Chest Port 1 View  Result Date: 02/19/2019 CLINICAL DATA:  Aspiration pneumonia, ETT, OD EXAM: PORTABLE CHEST 1 VIEW COMPARISON:  Radiograph 02/18/2019 FINDINGS: Endotracheal tube terminates in the mid trachea, 5 cm from the carina. Left subclavian line tip terminates at the superior cavoatrial junction. Transesophageal tube tip and side port distal to the GE junction. Mixed interstitial and more consolidative perihilar opacity present throughout the right hemithorax with the left lung has largely cleared since comparison radiograph 1 day prior. Cardiomediastinal contours are stable. No acute osseous or soft tissue abnormality. IMPRESSION: Near complete resolution of the opacities in the left hemithorax likely reflecting resolving atelectasis Persistent opacity throughout the right lung may reflect sequela of aspiration and/or edema. Lines and tubes as above. Electronically Signed   By: Kreg Shropshire M.D.   On: 02/19/2019 05:45    Overnight EEG with video  Result Date: 02/19/2019 Charlsie Quest, MD     02/20/2019 11:12 AM Patient Name: VIRLEE STROSCHEIN MRN: [id number] Epilepsy Attending: Charlsie Quest Referring Physician/Provider: Dr Milon Dikes Duration: 02/18/2019 1511 to 02/19/2019 1511  Patient history: 19 year old female presented after suicidal overdose with Lexapro, had cardiac arrest, ROSC achieved after 3 minutes and now having whole-body jerking episodes.  EEG to evaluate for seizures.  Level of alertness: awake, sedated  AEDs during EEG study: Versed  Technical aspects: This EEG study was done with scalp electrodes  positioned according to the 10-20 International system of electrode placement. Electrical activity was acquired at a sampling rate of 500Hz  and reviewed with a high frequency filter of 70Hz  and a low frequency filter of 1Hz . EEG data were recorded continuously and digitally stored. Description: EEG showed continuous generalized 2-3Hz  delta slowing with an excessive amount of 15 to 18 Hz, 2-3 uV beta activity with irregular morphology distributed symmetrically and diffusely. Hyperventilation and photic stimulation were not performed due to ams.  Abnormality -Continuous slow, generalized -Excessive beta, generalized  IMPRESSION: This study showed evidence of severe diffuse encephalopathy, nonspecific etiology but could be secondary to sedation. No seizures and epileptiform discharges were seen during this study.   ECHOCARDIOGRAM COMPLETE BUBBLE STUDY  Result Date: 02/18/2019   ECHOCARDIOGRAM REPORT   Patient Name:   MORRIS MARKHAM Date of Exam: 02/18/2019 Medical Rec #:  02/20/2019      Height:       66.5 in Accession #:    Molly Finn     Weight:       111.8 lb Date of Birth:  May 07, 2000      BSA:          1.57 m Patient Age:    18 years       BP:           101/61 mmHg Patient Gender: F              HR:           123 bpm. Exam Location:  Inpatient Procedure: 2D Echo Indications:    cardiac  arrest  History:        Patient has no prior history of Echocardiogram examinations.  Sonographer:    [id number] RDCS (AE) Referring Phys: [id number] 14/05/2000 O'NEAL  Sonographer Comments: Echo performed with patient supine and on artificial respirator. Image acquisition challenging due to respiratory motion. restricted mobility IMPRESSIONS  1. Left ventricular ejection fraction, by visual estimation, is 60 to 65%. The left ventricle has normal function. There is no left ventricular hypertrophy.  2. The left ventricle has no regional wall motion abnormalities.  3. Global right ventricle has normal systolic function.The right ventricular size is normal. No increase in right ventricular wall thickness.  4. Left atrial size was normal.  5. Right atrial size was normal.  6. The mitral valve is normal in structure. No evidence of mitral valve regurgitation. No evidence of mitral stenosis.  7. The tricuspid valve is normal in structure.  8. The tricuspid valve is normal in structure. Tricuspid valve regurgitation is not demonstrated.  9. The aortic valve is normal in structure. Aortic valve regurgitation is not visualized. No evidence of aortic valve sclerosis or stenosis. 10. The pulmonic valve was normal in structure. Pulmonic valve regurgitation is not visualized. 11. The inferior vena cava is normal in size with greater than 50% respiratory variability, suggesting right atrial pressure of 3 mmHg. FINDINGS  Left Ventricle: Left ventricular ejection fraction, by visual estimation, is 60 to 65%. The left ventricle has normal function. The left ventricle has no regional wall motion abnormalities. There is no left ventricular hypertrophy. Normal left atrial pressure. Right Ventricle: The right ventricular size is normal. No increase in right ventricular wall thickness. Global RV systolic function is has normal systolic function. Left Atrium: Left atrial size was normal in size. Right Atrium: Right atrial size was  normal in size Pericardium: There is no evidence of pericardial effusion. Mitral Valve: The mitral  valve is normal in structure. No evidence of mitral valve regurgitation. No evidence of mitral valve stenosis by observation. Tricuspid Valve: The tricuspid valve is normal in structure. Tricuspid valve regurgitation is not demonstrated. Aortic Valve: The aortic valve is normal in structure. Aortic valve regurgitation is not visualized. The aortic valve is structurally normal, with no evidence of sclerosis or stenosis. Pulmonic Valve: The pulmonic valve was normal in structure. Pulmonic valve regurgitation is not visualized. Pulmonic regurgitation is not visualized. Aorta: The aortic root, ascending aorta and aortic arch are all structurally normal, with no evidence of dilitation or obstruction. Venous: The inferior vena cava is normal in size with greater than 50% respiratory variability, suggesting right atrial pressure of 3 mmHg. IAS/Shunts: No atrial level shunt detected by color flow Doppler. There is no evidence of a patent foramen ovale. No ventricular septal defect is seen or detected. There is no evidence of an atrial septal defect.  LEFT VENTRICLE PLAX 2D LVIDd:         3.93 cm LV PW:         0.99 cm LV IVS:        0.58 cm  LEFT ATRIUM           Index       RIGHT ATRIUM          Index LA Vol (A4C): 16.0 ml 10.19 ml/m RA Area:     6.29 cm                                   RA Volume:   8.39 ml  5.34 ml/m  AORTIC VALVE LVOT Vmax:   62.10 cm/s LVOT Vmean:  39.100 cm/s LVOT VTI:    0.080 m  SHUNTS Systemic VTI: 0.08 m  Chilton Siiffany [place] MD Electronically signed by Chilton Siiffany  MD Signature Date/Time: 02/18/2019/6:41:17 PM    Final     I have independently seen and examined the patient, reviewed data, and developed an assessment and plan. A total of 36 minutes were spent in critical care assessment and medical decision making. This critical care time does not reflect procedure time, or teaching time or  supervisory time of PA/NP/Med student/Med Resident, etc but could involve care discussion time.  Gwynne EdingerPaul C. Akon Reinoso, MD PhD 02/20/19 11:24 AM

## 2019-02-20 NOTE — Plan of Care (Signed)
Overnight EEG not suggestive of seizures. Discontinue LTM We will continue to follow

## 2019-02-20 NOTE — Procedures (Signed)
Patient Name:Molly Callahan WIO:[id number] Epilepsy Attending:Aisia Correira Annabelle Harman Referring Physician/Provider:Dr Milon Dikes Duration:02/19/2019 1511 to 1/30/201 1040  Patient history:19 year old female presented after suicidal overdose with Lexapro, had cardiac arrest, ROSCachieved after 3 minutes and now having whole-body jerking episodes. EEG to evaluate for seizures.  Level of alertness:awake, sedated  AEDs during EEG study:Versed, VPA  Technical aspects: This EEG study was done with scalp electrodes positioned according to the 10-20 International system of electrode placement. Electrical activity was acquired at a sampling rate of 500Hz  and reviewed with a high frequency filter of 70Hz  and a low frequency filter of 1Hz . EEG data were recorded continuously and digitally stored.  Description: EEG showed continuous generalized 2-3Hz  delta slowing with an excessive amount of 15 to 18 Hz, 2-3 uV beta activity with irregular morphology distributed symmetrically and diffusely. Hyperventilation and photic stimulation were not performed due to ams.  Event button was pressed on 02/19/2019 at 1733 for unclear reasons. Concomitant EEG before during and after the event didn't show any eeg change to suggest seizure.  Abnormality -Continuous slow, generalized -Excessive beta, generalized  IMPRESSION: This studyshowed evidence of severe diffuse encephalopathy, nonspecific etiology but could be secondary to sedation. No seizures and epileptiform discharges were seen during this study.  Event button was pressed on 02/19/2019 at 1733 for unclear reason without EEG change and as therefore most likely non epileptic.   Molly Callahan 

## 2019-02-20 NOTE — Progress Notes (Signed)
Pt was transported to MRI and back to 4N32 without complications.

## 2019-02-20 NOTE — Plan of Care (Signed)
Did not tolerate MRI PCCM discussed using precedex instead of versed. I would recommend one more night of sedation with versed and start backing down tomorrow due to the fact that Versed was started for myoclonic jerking and has been in good control with versed and also due to possibly her metabolizing the SSRIs and the serotonin synd improving.  No rush for MRI (exam improving somewhat and is non-focal) - can try daytime tomorrow.  D/W Dr Ollen Bowl  Will follow  -- Milon Dikes, MD [facility] Pager: [phone] If 7pm to 7am, please call on call as listed on AMION.

## 2019-02-20 NOTE — Progress Notes (Signed)
LTM EEG discontinued - no skin breakdown at unhook.   

## 2019-02-20 NOTE — Progress Notes (Signed)
Neurology Progress Note   S:// Seen and examined.  Overnight EEG report pending. No seizure activity overnight   O:// Current vital signs: BP 113/76 (BP Location: Left Arm)   Pulse 88   Temp 99.7 F (37.6 C) (Bladder)   Resp (!) 26   Ht 5' 6.5" (1.689 m)   Wt 50 kg   LMP  (LMP Unknown)   SpO2 100%   BMI 17.53 kg/m  Vital signs in last 24 hours: Temp:  [98.2 F (36.8 C)-100.4 F (38 C)] 99.7 F (37.6 C) (01/30 0800) Pulse Rate:  [85-101] 88 (01/30 0800) Resp:  [12-35] 26 (01/30 0800) BP: (95-136)/(61-79) 113/76 (01/30 0800) SpO2:  [90 %-100 %] 100 % (01/30 0800) Arterial Line BP: (104-136)/(60-76) 136/76 (01/30 0800) FiO2 (%):  [40 %] 40 % (01/30 0800)  General: Sedated with Versed 10 mg/h, intubated HEENT: Normocephalic atraumatic CVS: Regular rate rhythm Respiratory: Vented, saturating well Abdomen: Lean, nondistended Extremities: Warm well perfused Neurological exam Sedated intubated as above No spontaneous movements noted To noxious stimulation, attempts to open eyes and withdraws appropriately as below. Cranial nerves: Pupils equal round react to light, extraocular movements-positive oculocephalics, present corneals, facial symmetry difficult to ascertain due to the tube but grossly symmetric. Motor exam: Withdraws appropriately in all fours.  Upon agitating her with the noxious stimulation, she does start to spontaneously move all 4 extremities. Sensory exam: As above Coordination cannot be tested     Medications  Current Facility-Administered Medications:  .  0.9 %  sodium chloride infusion, , Intravenous, PRN, Bowser, Laurel Dimmer, NP, Last Rate: 10 mL/hr at 02/20/19 0800, Rate Verify at 02/20/19 0800 .  ampicillin-sulbactam (UNASYN) 1.5 g in sodium chloride 0.9 % 100 mL IVPB, 1.5 g, Intravenous, Q6H, Erick Colace, NP, Last Rate: 200 mL/hr at 02/20/19 0357, 1.5 g at 02/20/19 0357 .  calcium chloride injection 1 g, 1 g, Intravenous, Once, Salvadore Dom E,  NP .  chlorhexidine gluconate (MEDLINE KIT) (PERIDEX) 0.12 % solution 15 mL, 15 mL, Mouth Rinse, BID, Erick Colace, NP, 15 mL at 02/20/19 0811 .  Chlorhexidine Gluconate Cloth 2 % PADS 6 each, 6 each, Topical, Daily, Erick Colace, NP, 6 each at 02/20/19 0100 .  feeding supplement (PIVOT 1.5 CAL) liquid 1,000 mL, 1,000 mL, Per Tube, Q24H, Ramaswamy, Murali, MD, Last Rate: 45 mL/hr at 02/19/19 1200, 1,000 mL at 02/19/19 1200 .  fentaNYL (SUBLIMAZE) injection 50-200 mcg, 50-200 mcg, Intravenous, Q30 min PRN, Erick Colace, NP, 100 mcg at 02/19/19 1615 .  lactated ringers infusion, , Intravenous, Continuous, Bowser, Grace E, NP, Last Rate: 125 mL/hr at 02/20/19 0800, Rate Verify at 02/20/19 0800 .  MEDLINE mouth rinse, 15 mL, Mouth Rinse, 10 times per day, Erick Colace, NP, 15 mL at 02/20/19 0400 .  midazolam (VERSED) 50 mg/50 mL (1 mg/mL) premix infusion, 0-10 mg/hr, Intravenous, Continuous, Erick Colace, NP, Last Rate: 10 mL/hr at 02/20/19 0809, 10 mg/hr at 02/20/19 0809 .  midazolam (VERSED) bolus via infusion 1-2 mg, 1-2 mg, Intravenous, Q2H PRN, Erick Colace, NP, 2 mg at 02/18/19 1152 .  midazolam (VERSED) injection 2 mg, 2 mg, Intravenous, Q15 min PRN, Erick Colace, NP, 2 mg at 02/18/19 1004 .  midazolam (VERSED) injection 2 mg, 2 mg, Intravenous, Q2H PRN, Erick Colace, NP .  pantoprazole sodium (PROTONIX) 40 mg/20 mL oral suspension 40 mg, 40 mg, Per Tube, Daily, Brand Males, MD, 40 mg at 02/19/19 1255 .  sodium chloride flush (  NS) 0.9 % injection 10-40 mL, 10-40 mL, Intracatheter, Q12H, Olalere, Adewale A, MD, 10 mL at 02/19/19 2202 .  sodium chloride flush (NS) 0.9 % injection 10-40 mL, 10-40 mL, Intracatheter, PRN, Olalere, Adewale A, MD .  valproic acid (DEPAKENE) 250 MG/5ML solution 500 mg, 500 mg, Per Tube, BID, Amie Portland, MD, 500 mg at 02/19/19 2201 Labs CBC    Component Value Date/Time   WBC 7.5 02/20/2019 0406   RBC 2.94 (L) 02/20/2019 0406    HGB 8.6 (L) 02/20/2019 0406   HCT 26.4 (L) 02/20/2019 0406   PLT 121 (L) 02/20/2019 0406   MCV 89.8 02/20/2019 0406   MCH 29.3 02/20/2019 0406   MCHC 32.6 02/20/2019 0406   RDW 14.0 02/20/2019 0406   LYMPHSABS 0.8 02/20/2019 0406   MONOABS 0.5 02/20/2019 0406   EOSABS 0.2 02/20/2019 0406   BASOSABS 0.0 02/20/2019 0406    CMP     Component Value Date/Time   NA 140 02/19/2019 2240   K 3.8 02/19/2019 2240   CL 111 02/19/2019 2240   CO2 23 02/19/2019 2240   GLUCOSE 127 (H) 02/19/2019 2240   BUN 13 02/19/2019 2240   CREATININE 0.86 02/19/2019 2240   CALCIUM 8.2 (L) 02/19/2019 2240   PROT 4.5 (L) 02/20/2019 0406   ALBUMIN 2.3 (L) 02/20/2019 0406   AST 37 02/20/2019 0406   ALT 56 (H) 02/20/2019 0406   ALKPHOS 52 02/20/2019 0406   BILITOT 0.7 02/20/2019 0406   GFRNONAA >60 02/19/2019 2240   GFRAA >60 02/19/2019 2240   Imaging I have reviewed images in epic and the results pertinent to this consultation are: CT head on presentation no acute changes. MRI brain planned for today  Assessment: 19 year old who intentionally overdosed on 35 to 45 pills of Lexapro and rectouterine, brought to the ED with concerns for seizure and posturing. Had a V. tach arrest with return of spontaneous circulation-total downtime 3 minutes. Initial EEG unremarkable for seizure but concerning for severe encephalopathy.  Anoxic/hypoxic brain injury cannot be ruled out Clinical exam improving with supportive care.  Impression: -Severe toxic metabolic encephalopathy secondary to overdose of Lexapro and Wellbutrin -Serotonin syndrome -Myoclonus -Evaluate for hypoxic/ischemic brain injury due to cardiac arrest  Recommendations: Await formal LTM EEG report.  If negative for seizures overnight, can discontinue LTM. MRI brain without contrast today. Continue Depakote 500 twice daily through tube. Supportive care per primary team as you are.  -- Amie Portland, MD [facility] Pager:  [phone] If 7pm to 7am, please call on call as listed on AMION.  CRITICAL CARE ATTESTATION Performed by: Amie Portland, MD Total critical care time: 35 minutes Critical care time was exclusive of separately billable procedures and treating other patients and/or supervising APPs/Residents/Students Critical care was necessary to treat or prevent imminent or life-threatening deterioration due to intentional medication overdose, serotonin syndrome, severe toxic metabolic encephalopathy This patient is critically ill and at significant risk for neurological worsening and/or death and care requires constant monitoring. Critical care was time spent personally by me on the following activities: development of treatment plan with patient and/or surrogate as well as nursing, discussions with consultants, evaluation of patient's response to treatment, examination of patient, obtaining history from patient or surrogate, ordering and performing treatments and interventions, ordering and review of laboratory studies, ordering and review of radiographic studies, pulse oximetry, re-evaluation of patient's condition, participation in multidisciplinary rounds and medical decision making of high complexity in the care of this patient.

## 2019-02-21 ENCOUNTER — Encounter (HOSPITAL_COMMUNITY): Payer: Self-pay

## 2019-02-21 DIAGNOSIS — G9340 Encephalopathy, unspecified: Secondary | ICD-10-CM

## 2019-02-21 LAB — LEGIONELLA PNEUMOPHILA SEROGP 1 UR AG: L. pneumophila Serogp 1 Ur Ag: NEGATIVE

## 2019-02-21 LAB — CULTURE, RESPIRATORY W GRAM STAIN
Culture: NORMAL
Gram Stain: NONE SEEN

## 2019-02-21 LAB — BASIC METABOLIC PANEL
Anion gap: 8 (ref 5–15)
BUN: 10 mg/dL (ref 6–20)
CO2: 22 mmol/L (ref 22–32)
Calcium: 8.6 mg/dL — ABNORMAL LOW (ref 8.9–10.3)
Chloride: 109 mmol/L (ref 98–111)
Creatinine, Ser: 0.54 mg/dL (ref 0.44–1.00)
GFR calc Af Amer: >60 mL/min (ref >60)
GFR calc non Af Amer: >60 mL/min (ref >60)
Glucose, Bld: 94 mg/dL (ref 70–99)
Potassium: 3.7 mmol/L (ref 3.5–5.1)
Sodium: 139 mmol/L (ref 135–145)

## 2019-02-21 LAB — GLUCOSE, CAPILLARY
Glucose-Capillary: 102 mg/dL — ABNORMAL HIGH (ref 70–99)
Glucose-Capillary: 106 mg/dL — ABNORMAL HIGH (ref 70–99)
Glucose-Capillary: 110 mg/dL — ABNORMAL HIGH (ref 70–99)
Glucose-Capillary: 135 mg/dL — ABNORMAL HIGH (ref 70–99)
Glucose-Capillary: 86 mg/dL (ref 70–99)
Glucose-Capillary: 90 mg/dL (ref 70–99)

## 2019-02-21 LAB — CBC WITH DIFFERENTIAL/PLATELET
Abs Immature Granulocytes: 0.04 10*3/uL (ref 0.00–0.07)
Basophils Absolute: 0 10*3/uL (ref 0.0–0.1)
Basophils Relative: 0 %
Eosinophils Absolute: 0.2 10*3/uL (ref 0.0–0.5)
Eosinophils Relative: 3 %
HCT: 26.9 % — ABNORMAL LOW (ref 36.0–46.0)
Hemoglobin: 8.8 g/dL — ABNORMAL LOW (ref 12.0–15.0)
Immature Granulocytes: 1 %
Lymphocytes Relative: 14 %
Lymphs Abs: 0.8 10*3/uL (ref 0.7–4.0)
MCH: 29.1 pg (ref 26.0–34.0)
MCHC: 32.7 g/dL (ref 30.0–36.0)
MCV: 89.1 fL (ref 80.0–100.0)
Monocytes Absolute: 0.4 10*3/uL (ref 0.1–1.0)
Monocytes Relative: 7 %
Neutro Abs: 4.4 10*3/uL (ref 1.7–7.7)
Neutrophils Relative %: 75 %
Platelets: 124 10*3/uL — ABNORMAL LOW (ref 150–400)
RBC: 3.02 MIL/uL — ABNORMAL LOW (ref 3.87–5.11)
RDW: 14.5 % (ref 11.5–15.5)
WBC: 5.9 10*3/uL (ref 4.0–10.5)
nRBC: 0 % (ref 0.0–0.2)

## 2019-02-21 LAB — CALCIUM, IONIZED: Calcium, Ionized, Serum: 5.2 mg/dL (ref 4.5–5.6)

## 2019-02-21 LAB — PHOSPHORUS: Phosphorus: 3.6 mg/dL (ref 2.5–4.6)

## 2019-02-21 LAB — PROCALCITONIN: Procalcitonin: 0.52 ng/mL

## 2019-02-21 LAB — MAGNESIUM: Magnesium: 1.6 mg/dL — ABNORMAL LOW (ref 1.7–2.4)

## 2019-02-21 MED ORDER — MIDAZOLAM 50MG/50ML (1MG/ML) PREMIX INFUSION
1.0000 mg/h | INTRAVENOUS | Status: DC
  Administered 2019-02-21: 9 mg/h via INTRAVENOUS
  Administered 2019-02-21: 8 mg/h via INTRAVENOUS
  Administered 2019-02-21: 10 mg/h via INTRAVENOUS
  Administered 2019-02-22: 7 mg/h via INTRAVENOUS
  Administered 2019-02-22: 8 mg/h via INTRAVENOUS
  Filled 2019-02-21 (×5): qty 50

## 2019-02-21 MED ORDER — POTASSIUM CHLORIDE 20 MEQ/15ML (10%) PO SOLN
40.0000 meq | Freq: Once | ORAL | Status: AC
  Administered 2019-02-21: 40 meq via ORAL
  Filled 2019-02-21: qty 30

## 2019-02-21 MED ORDER — MAGNESIUM SULFATE 2 GM/50ML IV SOLN
2.0000 g | Freq: Once | INTRAVENOUS | Status: AC
  Administered 2019-02-21: 2 g via INTRAVENOUS
  Filled 2019-02-21: qty 50

## 2019-02-21 MED ORDER — DEXMEDETOMIDINE HCL IN NACL 400 MCG/100ML IV SOLN
0.4000 ug/kg/h | INTRAVENOUS | Status: DC
  Administered 2019-02-21: 0.7 ug/kg/h via INTRAVENOUS
  Administered 2019-02-21: 0.4 ug/kg/h via INTRAVENOUS
  Administered 2019-02-22: 0.7 ug/kg/h via INTRAVENOUS
  Filled 2019-02-21 (×4): qty 100

## 2019-02-21 NOTE — Progress Notes (Signed)
Orange bite block was placed on pt's ETT without complications. RN at bedside for assistance.

## 2019-02-21 NOTE — Progress Notes (Signed)
Neurology Progress Note   S:// Seen and examined. Remains very agitated overnight Unable to lay still for MRI.   O:// Current vital signs: BP 120/90   Pulse 85   Temp 99.1 F (37.3 C)   Resp (!) 21   Ht 5' 6.5" (1.689 m)   Wt 50.5 kg   LMP  (LMP Unknown)   SpO2 92%   BMI 17.70 kg/m  Vital signs in last 24 hours: Temp:  [98.8 F (37.1 C)-99.7 F (37.6 C)] 99.1 F (37.3 C) (01/31 0700) Pulse Rate:  [76-95] 85 (01/31 0700) Resp:  [0-28] 21 (01/31 0700) BP: (108-148)/(62-90) 120/90 (01/31 0700) SpO2:  [92 %-100 %] 92 % (01/31 0700) Arterial Line BP: (129-159)/(67-85) 157/83 (01/31 0700) FiO2 (%):  [40 %] 40 % (01/31 0334) Weight:  [50.5 kg] 50.5 kg (01/31 0404) General: On sedation with Versed 10 an hour, intubated. HEENT: Normocephalic atraumatic CVS: Regular rate rhythm Respiratory: Vented Neurological exam Sedated intubated Spontaneously moving all 4 extremities powerfully and very agitated fashion Opens eyes to voice. Follows commands in all 4 extremities Cranial nerves: Pupils are equal round react light, extraocular movements are intact, visual fields are full, facial symmetry difficult to ascertain due to the tube. Motor exam: Full strength in all 4 extremities with much improved tone nearly normal today. Sensory: Intact to light touch DTRs: No hyperreflexia noted today.  No ankle clonus.  Toes downgoing bilaterally. Coordination cannot be assessed due to her agitation.  Medications  Current Facility-Administered Medications:  .  0.9 %  sodium chloride infusion, , Intravenous, PRN, Bowser, Laurel Dimmer, NP, Last Rate: 10 mL/hr at 02/21/19 0700, Rate Verify at 02/21/19 0700 .  ampicillin-sulbactam (UNASYN) 1.5 g in sodium chloride 0.9 % 100 mL IVPB, 1.5 g, Intravenous, Q6H, Erick Colace, NP, Stopped at 02/21/19 [phone] .  calcium chloride injection 1 g, 1 g, Intravenous, Once, Salvadore Dom E, NP .  chlorhexidine gluconate (MEDLINE KIT) (PERIDEX) 0.12 % solution  15 mL, 15 mL, Mouth Rinse, BID, Salvadore Dom E, NP, 15 mL at 02/20/19 1940 .  Chlorhexidine Gluconate Cloth 2 % PADS 6 each, 6 each, Topical, Daily, Erick Colace, NP, 6 each at 02/20/19 2100 .  feeding supplement (PIVOT 1.5 CAL) liquid 1,000 mL, 1,000 mL, Per Tube, Q24H, Ramaswamy, Murali, MD, Last Rate: 45 mL/hr at 02/20/19 1152, 1,000 mL at 02/20/19 1152 .  fentaNYL (SUBLIMAZE) injection 50-200 mcg, 50-200 mcg, Intravenous, Q30 min PRN, Erick Colace, NP, 100 mcg at 02/19/19 1615 .  lactated ringers infusion, , Intravenous, Continuous, Bowser, Grace E, NP, Last Rate: 125 mL/hr at 02/21/19 0700, Rate Verify at 02/21/19 0700 .  MEDLINE mouth rinse, 15 mL, Mouth Rinse, 10 times per day, Erick Colace, NP, 15 mL at 02/21/19 0533 .  midazolam (VERSED) 50 mg/50 mL (1 mg/mL) premix infusion, 0-10 mg/hr, Intravenous, Continuous, Erick Colace, NP, Last Rate: 10 mL/hr at 02/21/19 0700, 10 mg/hr at 02/21/19 0700 .  midazolam (VERSED) bolus via infusion 1-2 mg, 1-2 mg, Intravenous, Q2H PRN, Erick Colace, NP, 2 mg at 02/18/19 1152 .  midazolam (VERSED) injection 2 mg, 2 mg, Intravenous, Q15 min PRN, Erick Colace, NP, 2 mg at 02/18/19 1004 .  midazolam (VERSED) injection 2 mg, 2 mg, Intravenous, Q2H PRN, Erick Colace, NP .  pantoprazole sodium (PROTONIX) 40 mg/20 mL oral suspension 40 mg, 40 mg, Per Tube, Daily, Brand Males, MD, 40 mg at 02/20/19 1004 .  sodium chloride flush (NS) 0.9 % injection  10-40 mL, 10-40 mL, Intracatheter, Q12H, Olalere, Adewale A, MD, 10 mL at 02/20/19 2110 .  sodium chloride flush (NS) 0.9 % injection 10-40 mL, 10-40 mL, Intracatheter, PRN, Olalere, Adewale A, MD .  valproic acid (DEPAKENE) 250 MG/5ML solution 500 mg, 500 mg, Per Tube, BID, Amie Portland, MD, 500 mg at 02/20/19 2110 Labs CBC    Component Value Date/Time   WBC 5.9 02/21/2019 0404   RBC 3.02 (L) 02/21/2019 0404   HGB 8.8 (L) 02/21/2019 0404   HCT 26.9 (L) 02/21/2019 0404   PLT 124  (L) 02/21/2019 0404   MCV 89.1 02/21/2019 0404   MCH 29.1 02/21/2019 0404   MCHC 32.7 02/21/2019 0404   RDW 14.5 02/21/2019 0404   LYMPHSABS 0.8 02/21/2019 0404   MONOABS 0.4 02/21/2019 0404   EOSABS 0.2 02/21/2019 0404   BASOSABS 0.0 02/21/2019 0404    CMP     Component Value Date/Time   NA 139 02/21/2019 0404   K 3.7 02/21/2019 0404   CL 109 02/21/2019 0404   CO2 22 02/21/2019 0404   GLUCOSE 94 02/21/2019 0404   BUN 10 02/21/2019 0404   CREATININE 0.54 02/21/2019 0404   CALCIUM 8.6 (L) 02/21/2019 0404   PROT 4.5 (L) 02/20/2019 0406   ALBUMIN 2.3 (L) 02/20/2019 0406   AST 37 02/20/2019 0406   ALT 56 (H) 02/20/2019 0406   ALKPHOS 52 02/20/2019 0406   BILITOT 0.7 02/20/2019 0406   GFRNONAA >60 02/21/2019 0404   GFRAA >60 02/21/2019 0404   Imaging I have reviewed images in epic and the results pertinent to this consultation are: MRI of the brain pending CT head with no acute changes  Assessment: 19 year old with intentional overdose of 35 to 45 pills of Lexapro and Wellbutrin brought in with concerns for seizures and posturing.  Had V. tach and cardiac arrest, requiring critical care services.  Total downtime was 3 minutes. Starting to follow commands. Presentation likely due to severe toxic metabolic encephalopathy due to intentional overdose and serotonin syndrome. Myoclonus and muscle tone has improved and there is no ankle clonus on examination today.   Recommendations: -Continue supportive management per primary team. -LTM over 2 days revealed no seizure activity-some spontaneous movements were likely muscular or of myoclonic and did not appear to be in any kind of status epilepticus -Continue Depakote 500 mg twice daily through the tube-might not needed for longer term but I will continue it for now. -As for sedation, discussed with the primary team-can attempt to transition to Precedex from Versed and can always be switched back to Versed if she is excessively  agitated and if the agitation interferes with her medical care -Discussed with Dr. Maryjean Ka on the floor. -- Amie Portland, MD [facility] Pager: [phone] If 7pm to 7am, please call on call as listed on AMION.  CRITICAL CARE ATTESTATION Performed by: Amie Portland, MD Total critical care time: 30 minutes Critical care time was exclusive of separately billable procedures and treating other patients and/or supervising APPs/Residents/Students Critical care was necessary to treat or prevent imminent or life-threatening deterioration due to severe toxic metabolic encephalopathy from intentional overdose.  Serotonin syndrome.  Concern for seizure. This patient is critically ill and at significant risk for neurological worsening and/or death and care requires constant monitoring. Critical care was time spent personally by me on the following activities: development of treatment plan with patient and/or surrogate as well as nursing, discussions with consultants, evaluation of patient's response to treatment, examination of patient, obtaining history  from patient or surrogate, ordering and performing treatments and interventions, ordering and review of laboratory studies, ordering and review of radiographic studies, pulse oximetry, re-evaluation of patient's condition, participation in multidisciplinary rounds and medical decision making of high complexity in the care of this patient.

## 2019-02-21 NOTE — Progress Notes (Addendum)
NAME:  Molly Callahan, MRN:  [id number], DOB:  Feb 13, 2000, LOS: 4 ADMISSION DATE:  02/17/2019, CONSULTATION DATE:  02/17/2019 REFERRING MD:  Ed physician, CHIEF COMPLAINT:  Drug overdose   Brief History   History available from chart Intentional drug overdose 35 to 45 pills of Lexapro and Wellbutrin around 7:30 PM Was complaining of nausea headache and dizziness Posturing in the ED with seizures  History of depression, anxiety, past history of suicidal ideation  Significant Hospital Events   1/27 Seizures in the ED; intubated, Poison control notified, given activated charcoal.  1/28: QTC prolonged.  Became progressively widened up to 700 cardiology consulted who recommended IV beta-blockade. Intermittently having seizure like activity, initially starting with symmetric facial twitching which increased in intensity and was associated with bilateral arm flexion which was not rhythmic.  Became hypotensive requiring norepinephrine, at 6:57 AM developed V. tach arrest.  CPR lasted less than 3 minutes was defibrillated, given antiarrhythmic therapy, demonstrated successful return of spontaneous circulation.  Neurology consulted, loaded with Depakote.  Spiking fever, , White blood cell count rising, chest x-ray with significant right greater than left airspace disease raising concern for aspiration, Unasyn started. Remains unresponsive, having intermittent seizure-like activity   1/28 - poiston control advice to [employer]  -  Recommendations from their stand-point remain supportive. -treat seizures or myoclonus activity ->typical onset is usually at the 18 hr point and she is following this trend. Could last for extended time frame (days).  -no role for HD (discussed specifically) -continue current supportive care -watch QT closely             Maximize K and Mg (keep high nml)                         Cardiology has added isuprel -if she were to have another cardiac arrest poison control  recommends intravenous fat emulsion therapy (see 02/18/2019 notes of P Babcock) No role for empirically administering however    Consults:  PCCM  Procedures:  Tracheal intubation 02/17/2019  Significant Diagnostic Tests:  CT head 02/17/2019-no significant abnormality  Micro Data:  Respiratory culture 1/28>>> Antimicrobials:  Unasyn 1/28>> Interim history/subjective:   No acute events overnight.   Objective   Blood pressure 115/78, pulse 88, temperature 99.1 F (37.3 C), resp. rate (!) 26, height 5' 6.5" (1.689 m), weight 50.5 kg, SpO2 92 %.    Vent Mode: PRVC FiO2 (%):  [40 %] 40 % Set Rate:  [26 bmp] 26 bmp Vt Set:  [400 mL] 400 mL PEEP:  [5 cmH20-10 cmH20] 10 cmH20 Pressure Support:  [10 cmH20] 10 cmH20 Plateau Pressure:  [13 cmH20-24 cmH20] 19 cmH20   Intake/Output Summary (Last 24 hours) at 02/21/2019 0936 Last data filed at 02/21/2019 0920 Gross per 24 hour  Intake 5276.41 ml  Output 6300 ml  Net -1023.59 ml   Filed Weights   02/18/19 0200 02/19/19 0500 02/21/19 0404  Weight: 50.7 kg 50 kg 50.5 kg    Examination: General: thin adult F. Critically ill appearing, intubated, sedated, intermittently moving  HEENT: [place]/AT pink mmm  Trachea midline ETT secure  Pulmonary: CTA. Initiating breaths spontaneously when awakened, vent rate at 26 Cardiac: RRR. No r/m/g Abdomen: soft flat ndnt + bowel sounds  Neuro: opens eyes to stim.  Increased muscle tone, but improved compared to exam 1/30. No clonus today. Grimaces, withdraws, then awakens/moves to noxious stimuli and moving purposefully. Extremities: BUE wrist restraints.  No cyanosis or clubbing,  2+ radial, DP, PT pulses GU: clear yellow   Resolved Hospital Problem list     Assessment & Plan:   Serotonin Syndrome Intentional drug overdose -Background history of anxiety/depression -Received activated charcoal  - clonus, neuro exam improving  Plan --Poison control involved; if decompensates will need fat  emulsion. See Cindee Lame Babcock's note 02/18/2019  for further details --Mag goal >2 --K >2 --QTc monitoring but now WNL --defervesced, but if febrile use cool fluids, ice packs. -with improving neuro exam, transition to precedex with     Seizure versus myoclonus - -likely in setting of serotonin syndrome. Anoxia from VT arrest is less favored due to neuro exam improvement   Plan Appreciate neuro following.  EEG off--no apparent sz activity (?) MRI brain when able AED per neuro Seizure precautions  Status post VT arrest in the setting of prolonged QTC - -Time to ROSC less than 3 minutes 02/20/2019 - QTc 450 msec and cards stopped  Isoprotenrol. Off pressors  Plan --currently stable  Electrolyte imbalance  Plan  - Mag goal > 2 - K goal > 4 - replete phos  -serial labs   Circulatory shock    - -possible component of sepsis with likely aspiration PNA  -resolved and off pressors -hemodynamically stable  Plan MAP goal > 65 Continue LR at 125cc/hr  Acute hypoxic respiratory failure in the setting of diffuse bilateral right greater than left pulmonary infiltrates: - likely represents aspiration pneumonia - - Plan PRVC; move to vent weaning/SBT as tolerated pusle ox goal > 88% VAP bundle   AKI, mild - creat 1.2 on 1/28 Plan -WNL now; continue to follow  Anemia critical illness   - no active bleeding  Plan  - - PRBC for hgb </= 6.9gm%    - exceptions are   -  if ACS susepcted/confirmed then transfuse for hgb </= 8.0gm%,  or    -  active bleeding with hemodynamic instability, then transfuse regardless of hemoglobin value   At at all times try to transfuse 1 unit prbc as possible with exception of active hemorrhage     MDD with SI S/p intentional OD of antidepressant Rxs   Plan Will need psych when clinically improved  Best practice:  Diet: NPO Pain/Anxiety/Delirium protocol (if indicated): FVersed, PRN fent  VAP protocol (if indicated): In place DVT  prophylaxis: In place GI prophylaxis:  Glucose control: Monitor Mobility: Bedrest Code Status: Full code Family Communication: Mom and dad updated over phone 02/19/2019  Disposition: ICU   Global: improving serotonin syndrome  LABS    PULMONARY Recent Labs  Lab 02/18/19 0023 02/18/19 0410 02/18/19 0800 02/18/19 1000 02/19/19 0822  PHART 7.346* 7.353 7.195* 7.241* 7.413  PCO2ART 39.9 39.4 50.7* 43.8 34.1  PO2ART 288* 164* 196* 157* 134.0*  HCO3 21.4 21.3 18.8* 17.7* 21.6  TCO2  --   --   --   --  23  O2SAT 99.5 99.3 98.9 98.6 99.0    CBC Recent Labs  Lab 02/19/19 0522 02/19/19 0522 02/19/19 0822 02/20/19 0406 02/21/19 0404  HGB 9.8*   < > 8.8* 8.6* 8.8*  HCT 31.0*   < > 26.0* 26.4* 26.9*  WBC 15.8*  --   --  7.5 5.9  PLT 148*  --   --  121* 124*   < > = values in this interval not displayed.    COAGULATION Recent Labs  Lab 02/19/19 0832 02/20/19 0406  INR 1.6* 1.4*    CARDIAC  No results for input(s):  TROPONINI in the last 168 hours. No results for input(s): PROBNP in the last 168 hours.   CHEMISTRY Recent Labs  Lab 02/18/19 1829 02/18/19 9371 02/18/19 0941 02/18/19 1654 02/19/19 0522 02/19/19 6967 02/19/19 8938 02/19/19 0832 02/19/19 1654 02/19/19 2240 02/20/19 0406 02/20/19 1215 02/21/19 0404  NA 141   < > 140  --   --  145 142  --   --  140  --   --  139  K 3.3*   < > 4.4  --    < > 4.3 4.2   < >  --  3.8  --   --  3.7  CL 112*  --  114*  --   --   --  113*  --   --  111  --   --  109  CO2 21*  --  19*  --   --   --  21*  --   --  23  --   --  22  GLUCOSE 122*  --  127*  --   --   --  85  --   --  127*  --   --  94  BUN 10  --  9  --   --   --  9  --   --  13  --   --  10  CREATININE 1.12*  --  1.20*  --   --   --  1.12*  --   --  0.86  --   --  0.54  CALCIUM 7.9*  --  8.4*  --   --   --  8.5*  --   --  8.2*  --   --  8.6*  MG 2.8*  --   --    < >   < >  --  2.0   < > 1.9 1.9 1.8 2.0 1.6*  PHOS  --   --   --    < >   < >  --  3.3  --   3.3  --  1.7* 2.0* 3.6   < > = values in this interval not displayed.   Estimated Creatinine Clearance: 89.4 mL/min (by C-G formula based on SCr of 0.54 mg/dL).   LIVER Recent Labs  Lab 02/17/19 2113 02/18/19 0637 02/19/19 0832 02/20/19 0406  AST 17 19  --  37  ALT 14 10  --  56*  ALKPHOS 47 43  --  52  BILITOT 1.3* 1.1  --  0.7  PROT 6.9 5.9*  --  4.5*  ALBUMIN 4.1 3.5  --  2.3*  INR  --   --  1.6* 1.4*     INFECTIOUS Recent Labs  Lab 02/18/19 0941 02/18/19 1032 02/19/19 0832 02/20/19 0000 02/20/19 0406 02/21/19 0404  LATICACIDVEN 3.1* 3.6*  --  1.7  --   --   PROCALCITON  --   --  1.35  --  0.94 0.52     ENDOCRINE CBG (last 3)  Recent Labs    02/20/19 2343 02/21/19 0325 02/21/19 0830  GLUCAP 112* 86 90         IMAGING x48h  - image(s) personally visualized  -   highlighted in bold DG CHEST PORT 1 VIEW  Result Date: 02/20/2019 CLINICAL DATA:  Followup ventilator support. EXAM: PORTABLE CHEST 1 VIEW COMPARISON:  02/19/2019 FINDINGS: Endotracheal tube 6 cm above the carina. Orogastric or nasogastric 2 enters the abdomen. Left subclavian central line tip  at the [facility] RA junction. Left chest shows only mild atelectasis at the left base. On the right, there is increasing density apparently due to accumulating pleural fluid as well as right lower lung atelectasis/infiltrate. IMPRESSION: Lines and tubes well position. Improvement on the left with only mild atelectasis at the left base. Worsened appearance on the right with accumulating pleural fluid and worsened right lower lung volume loss/infiltrate. Electronically Signed   By: Paulina Fusi M.D.   On: 02/20/2019 06:52    I have independently seen and examined the patient, reviewed data, and developed an assessment and plan. A total of 32 minutes were spent in critical care assessment and medical decision making. This critical care time does not reflect procedure time, or teaching time or supervisory time of  PA/NP/Med student/Med Resident, etc but could involve care discussion time.  Gwynne Edinger, MD PhD 02/21/19 9:41 AM

## 2019-02-22 ENCOUNTER — Inpatient Hospital Stay (HOSPITAL_COMMUNITY): Payer: BC Managed Care – PPO

## 2019-02-22 DIAGNOSIS — J9601 Acute respiratory failure with hypoxia: Secondary | ICD-10-CM | POA: Diagnosis not present

## 2019-02-22 DIAGNOSIS — Z4682 Encounter for fitting and adjustment of non-vascular catheter: Secondary | ICD-10-CM | POA: Diagnosis not present

## 2019-02-22 DIAGNOSIS — G9340 Encephalopathy, unspecified: Secondary | ICD-10-CM | POA: Diagnosis not present

## 2019-02-22 DIAGNOSIS — R569 Unspecified convulsions: Secondary | ICD-10-CM | POA: Diagnosis not present

## 2019-02-22 DIAGNOSIS — J690 Pneumonitis due to inhalation of food and vomit: Secondary | ICD-10-CM | POA: Diagnosis not present

## 2019-02-22 LAB — CBC WITH DIFFERENTIAL/PLATELET
Abs Immature Granulocytes: 0.02 10*3/uL (ref 0.00–0.07)
Basophils Absolute: 0 10*3/uL (ref 0.0–0.1)
Basophils Relative: 0 %
Eosinophils Absolute: 0.2 10*3/uL (ref 0.0–0.5)
Eosinophils Relative: 4 %
HCT: 29.2 % — ABNORMAL LOW (ref 36.0–46.0)
Hemoglobin: 9.4 g/dL — ABNORMAL LOW (ref 12.0–15.0)
Immature Granulocytes: 0 %
Lymphocytes Relative: 19 %
Lymphs Abs: 0.8 10*3/uL (ref 0.7–4.0)
MCH: 28.8 pg (ref 26.0–34.0)
MCHC: 32.2 g/dL (ref 30.0–36.0)
MCV: 89.6 fL (ref 80.0–100.0)
Monocytes Absolute: 0.4 10*3/uL (ref 0.1–1.0)
Monocytes Relative: 9 %
Neutro Abs: 3.1 10*3/uL (ref 1.7–7.7)
Neutrophils Relative %: 68 %
Platelets: 138 10*3/uL — ABNORMAL LOW (ref 150–400)
RBC: 3.26 MIL/uL — ABNORMAL LOW (ref 3.87–5.11)
RDW: 14.1 % (ref 11.5–15.5)
WBC: 4.5 10*3/uL (ref 4.0–10.5)
nRBC: 0 % (ref 0.0–0.2)

## 2019-02-22 LAB — BASIC METABOLIC PANEL
Anion gap: 9 (ref 5–15)
BUN: 11 mg/dL (ref 6–20)
CO2: 20 mmol/L — ABNORMAL LOW (ref 22–32)
Calcium: 8.6 mg/dL — ABNORMAL LOW (ref 8.9–10.3)
Chloride: 108 mmol/L (ref 98–111)
Creatinine, Ser: 0.33 mg/dL — ABNORMAL LOW (ref 0.44–1.00)
GFR calc Af Amer: >60 mL/min (ref >60)
GFR calc non Af Amer: >60 mL/min (ref >60)
Glucose, Bld: 102 mg/dL — ABNORMAL HIGH (ref 70–99)
Potassium: 3.9 mmol/L (ref 3.5–5.1)
Sodium: 137 mmol/L (ref 135–145)

## 2019-02-22 LAB — PHOSPHORUS: Phosphorus: 4.6 mg/dL (ref 2.5–4.6)

## 2019-02-22 LAB — GLUCOSE, CAPILLARY
Glucose-Capillary: 80 mg/dL (ref 70–99)
Glucose-Capillary: 81 mg/dL (ref 70–99)
Glucose-Capillary: 84 mg/dL (ref 70–99)
Glucose-Capillary: 93 mg/dL (ref 70–99)
Glucose-Capillary: 99 mg/dL (ref 70–99)

## 2019-02-22 LAB — MAGNESIUM: Magnesium: 1.6 mg/dL — ABNORMAL LOW (ref 1.7–2.4)

## 2019-02-22 MED ORDER — AMOXICILLIN-POT CLAVULANATE 875-125 MG PO TABS
1.0000 | ORAL_TABLET | Freq: Two times a day (BID) | ORAL | Status: DC
  Filled 2019-02-22: qty 1

## 2019-02-22 MED ORDER — FENTANYL CITRATE (PF) 100 MCG/2ML IJ SOLN: 50.0000 ug | INTRAMUSCULAR | Status: DC | PRN

## 2019-02-22 MED ORDER — ACETAMINOPHEN 325 MG PO TABS: 650.0000 mg | ORAL_TABLET | Freq: Four times a day (QID) | ORAL | Status: DC | PRN

## 2019-02-22 MED ORDER — AMOXICILLIN-POT CLAVULANATE 875-125 MG PO TABS
1.0000 | ORAL_TABLET | Freq: Two times a day (BID) | ORAL | Status: AC
  Administered 2019-02-22 – 2019-02-24 (×4): 1
  Filled 2019-02-22 (×6): qty 1

## 2019-02-22 MED ORDER — POTASSIUM CHLORIDE 20 MEQ/15ML (10%) PO SOLN
40.0000 meq | Freq: Once | ORAL | Status: AC
  Administered 2019-02-22: 40 meq
  Filled 2019-02-22: qty 30

## 2019-02-22 MED ORDER — IBUPROFEN 100 MG PO CHEW
200.0000 mg | CHEWABLE_TABLET | Freq: Three times a day (TID) | ORAL | Status: DC | PRN
  Filled 2019-02-22: qty 2

## 2019-02-22 MED ORDER — METOCLOPRAMIDE HCL 5 MG/ML IJ SOLN
10.0000 mg | Freq: Three times a day (TID) | INTRAMUSCULAR | Status: DC | PRN
  Administered 2019-02-22: 10 mg via INTRAVENOUS
  Filled 2019-02-22: qty 2

## 2019-02-22 MED ORDER — MIDAZOLAM HCL 2 MG/2ML IJ SOLN: 2.0000 mg | INTRAMUSCULAR | Status: DC | PRN

## 2019-02-22 MED ORDER — FENTANYL CITRATE (PF) 100 MCG/2ML IJ SOLN
50.0000 ug | INTRAMUSCULAR | Status: DC | PRN
  Administered 2019-02-22: 50 ug via INTRAVENOUS
  Filled 2019-02-22 (×2): qty 2

## 2019-02-22 MED ORDER — PHENOL 1.4 % MT LIQD
1.0000 | OROMUCOSAL | Status: DC | PRN
  Administered 2019-02-23: 1 via OROMUCOSAL
  Filled 2019-02-22: qty 177

## 2019-02-22 MED ORDER — SENNOSIDES 8.8 MG/5ML PO SYRP
15.0000 mL | ORAL_SOLUTION | Freq: Two times a day (BID) | ORAL | Status: DC
  Administered 2019-02-22 – 2019-02-26 (×2): 15 mL
  Filled 2019-02-22 (×8): qty 15

## 2019-02-22 MED ORDER — PIVOT 1.5 CAL PO LIQD: 1000.0000 mL | ORAL | Status: DC

## 2019-02-22 MED ORDER — MAGNESIUM SULFATE 4 GM/100ML IV SOLN
4.0000 g | Freq: Once | INTRAVENOUS | Status: AC
  Administered 2019-02-22: 4 g via INTRAVENOUS
  Filled 2019-02-22: qty 100

## 2019-02-22 MED ORDER — ORAL CARE MOUTH RINSE
15.0000 mL | Freq: Two times a day (BID) | OROMUCOSAL | Status: DC
  Administered 2019-02-22 – 2019-02-24 (×5): 15 mL via OROMUCOSAL

## 2019-02-22 MED ORDER — VALPROATE SODIUM 500 MG/5ML IV SOLN
500.0000 mg | Freq: Two times a day (BID) | INTRAVENOUS | Status: DC
  Filled 2019-02-22 (×2): qty 5

## 2019-02-22 MED ORDER — BISACODYL 10 MG RE SUPP: 10.0000 mg | Freq: Every day | RECTAL | Status: DC | PRN

## 2019-02-22 NOTE — Progress Notes (Signed)
NAME:  CHLORIS MARCOUX, MRN:  [id number], DOB:  2000/10/04, LOS: 5 ADMISSION DATE:  02/17/2019, CONSULTATION DATE:  02/17/2019 REFERRING MD:  Ed physician, CHIEF COMPLAINT:  Drug overdose   Brief History   History available from chart Intentional drug overdose 35 to 45 pills of Lexapro and Wellbutrin around 7:30 PM Was complaining of nausea headache and dizziness Posturing in the ED with seizures  History of depression, anxiety, past history of suicidal ideation  Significant Hospital Events   1/27 Seizures in the ED; intubated, Poison control notified, given activated charcoal.  1/28: QTC prolonged.  Became progressively widened up to 700 cardiology consulted who recommended IV beta-blockade. Intermittently having seizure like activity, initially starting with symmetric facial twitching which increased in intensity and was associated with bilateral arm flexion which was not rhythmic.  Became hypotensive requiring norepinephrine, at 6:57 AM developed V. tach arrest.  CPR lasted less than 3 minutes was defibrillated, given antiarrhythmic therapy, demonstrated successful return of spontaneous circulation.  Neurology consulted, loaded with Depakote.  Spiking fever, , White blood cell count rising, chest x-ray with significant right greater than left airspace disease raising concern for aspiration, Unasyn started. Remains unresponsive, having intermittent seizure-like activity 1/28 - [place] control Recommendations from their stand-point remain supportive. -treat seizures or myoclonus activity ->typical onset is usually at the 18 hr point and she is following this trend. Could last for extended time frame (days).  -no role for HD (discussed specifically) -continue current supportive care -watch QT closely 1/29 thru 1/31->no seizures via LTM. Clinically improving. Shock resolved.  2/1 following commands. Weaning but paradoxical on SBT. Versed stopped.  Consults:  PCCM  Procedures:  Tracheal  intubation 02/17/2019 Left [place] CVL 1/27>>> Left fem aline 1/27>>>  Significant Diagnostic Tests:  CT head 02/17/2019-no significant abnormality  Micro Data:  Respiratory culture 1/28>>>neg Antimicrobials:  Unasyn 1/28>> Interim history/subjective:   No acute events over night   Objective   Blood pressure 119/82, pulse 62, temperature 98.4 F (36.9 C), resp. rate (Abnormal) 26, height 5' 6.5" (1.689 m), weight 51.1 kg, SpO2 98 %.    Vent Mode: PRVC FiO2 (%):  [40 %-60 %] 60 % Set Rate:  [26 bmp] 26 bmp Vt Set:  [400 mL] 400 mL PEEP:  [5 cmH20] 5 cmH20 Plateau Pressure:  [16 cmH20-18 cmH20] 18 cmH20   Intake/Output Summary (Last 24 hours) at 02/22/2019 0808 Last data filed at 02/22/2019 0737 Gross per 24 hour  Intake 4312.89 ml  Output 6000 ml  Net -1687.11 ml   Filed Weights   02/19/19 0500 02/21/19 0404 02/22/19 0456  Weight: 50 kg 50.5 kg 51.1 kg    Examination: General this is a 19 year old WF who is sedated on precedex infusion. She did have sig accessory use and paradoxical effort on PSV of 5, this went away on PSV of 15 HENT NCAT no JVD orally intubated. Has bite block Pulm decreased bilaterally.  PSV 15/peep 5 RR19-mid 20s, Vts mid 400s.  Card RRR  abd not tender + bowel sounds  Neuro moves all ext . Follows commands. No myoclonus noted today  Ext warm and dry   Resolved Hospital Problem list   AKI (resolved) Circulatory shock (felt mix of meds and septic shock from aspiration)-->resolved 1/28 VT arrest 1/30  2/2 prolonged QTc-->time to ROSC < 3 minutes. defib x 1  Assessment & Plan:    Intentional drug overdose complicated by Serotonin syndrome and myoclonus  -Background history of anxiety/depression -eeg neg for seizures;  LTM stopped.  -s/p activated charcoal and started on Depakote.  -myoclonus improved.  -now interactive and following commands  Plan Supportive care Dc versed  Cont precedex Cont depakote for now Eventually will need MRI Safety  sitter s/p extubation    Acute hypoxic respiratory failure in the setting of diffuse bilateral right greater than left pulmonary infiltrates: - likely represents aspiration pneumonia - - -sputum neg -last cxr on 1/30 predom right sided airspace disease.  Plan PSV today. Assess for extubation (may not be ready yet given accessory use and paradoxical efforts on PSV of 5) Repeat CXR this am VAP bundle  PAD protocol RASS 0 Pulse ox NPO s/p extubation  Day 5 of 5 Unasyn (will dc after completing today)  Electrolyte imbalance: hypomagnesemia  Plan Replace Mg Repeat am chemistry   Anemia critical illness   - no active bleeding Plan Trend cbc and transfuse for hgb < 7 or active bleeding   MDD with SI S/p intentional OD of antidepressant Rxs  Plan Plan for psych consult s/p extubation and after resolution of delirium   General ICU care Plan Dc a line and foley cath today  Hold tube feeds in anticipation of extubation Start bowel regimen   Best practice:  Diet: NPO Pain/Anxiety/Delirium protocol (if indicated): FVersed, PRN fent  VAP protocol (if indicated): In place DVT prophylaxis: In place GI prophylaxis:  Glucose control: Monitor Mobility: Bedrest Code Status: Full code Family Communication: Mom and dad updated over phone 02/19/2019  Disposition: ICU    My cct 40 minutes.   Simonne Martinet ACNP-BC [facility] Pager # [phone] OR # [phone] if no answer

## 2019-02-22 NOTE — Progress Notes (Signed)
OG tube at top of stomach, verbal from Zenia Resides NP to advance.

## 2019-02-22 NOTE — Progress Notes (Signed)
Called to bedside STAT by RN. Upon arrival patient had self extubated. Patient placed on 4L [place]. Patient in no distress & has good strong cough. Molly Simmonds NP at bedside agrees to leave patient on 4L [place] & continue to monitor.  Mat Carne RRT

## 2019-02-22 NOTE — Progress Notes (Signed)
Notified by RN of gagging on ETT despite precedex at 1.2 mcg/kg/min with vomiting x 3 episodes of black contents (she did receive activated charcoal). Unclear last BM.  Given fentanyl prn with improvement in gagging/ agitation.    P:  TF on hold for OGT to LIWS x 4 hours then clamp and resume trickle feeds around 2000 QTc currently 430, will add prn reglan prn nausea with close monitoring of QTc Placed back on full MV support for now Senokot added today, will add dulcolax prn as well  Continue prn fentanyl with precedex gtt for RASS goal 0/-1   Posey Boyer, MSN, AGACNP-BC [facility] 02/22/2019, 2:14 PM

## 2019-02-22 NOTE — Progress Notes (Signed)
eLink Physician-Brief Progress Note Patient Name: Molly Callahan DOB: 2000/09/04 MRN: [id number]   Date of Service  02/22/2019  HPI/Events of Note  Patient c/o headache - ALT elevated. Creatinine = 0.33.   eICU Interventions  Will order: 1. Motrin chewable 200 mg PO Q 8 hours PRN headache or pain.                           Intervention Category Major Interventions: Other:  Lenell Antu 02/22/2019, 9:24 PM

## 2019-02-22 NOTE — Progress Notes (Signed)
Pt self extubated.   Looks comfortable.  Plan Safety sitter ordered Dc sedation Pulse ox NPO Titrate oxygen   Molly Callahan ACNP-BC [facility] Pager # [phone] OR # [phone] if no answer

## 2019-02-22 NOTE — Progress Notes (Signed)
Vomited x2 at 1200 & 1330 black & mucous, OG to LIS per CCM Will continue to monitor

## 2019-02-22 NOTE — Progress Notes (Addendum)
Subjective: Patient remains intubated, however now attempting to wean off.  Able to follow commands.  Able to answer questions by nodding head  Objective: Current vital signs: BP 109/78 (BP Location: Right Arm)   Pulse 79   Temp 98.4 F (36.9 C) (Bladder)   Resp (!) 23   Ht 5' 6.5" (1.689 m)   Wt 51.1 kg   LMP  (LMP Unknown)   SpO2 97%   BMI 17.91 kg/m  Vital signs in last 24 hours: Temp:  [97.9 F (36.6 C)-98.4 F (36.9 C)] 98.4 F (36.9 C) (02/01 0800) Pulse Rate:  [58-79] 79 (02/01 0800) Resp:  [19-27] 23 (02/01 0800) BP: (109-157)/(75-92) 109/78 (02/01 0800) SpO2:  [97 %-100 %] 97 % (02/01 0800) Arterial Line BP: (137-162)/(77-91) 137/77 (02/01 0800) FiO2 (%):  [40 %-60 %] 50 % (02/01 0845) Weight:  [51.1 kg] 51.1 kg (02/01 0456)  Intake/Output from previous day: 01/31 0701 - 02/01 0700 In: 4504.6 [I.V.:3469.5; NG/GT:585; IV Piggyback:450] Out: 4163 [Urine:5475] Intake/Output this shift: Total I/O In: 1044 [I.V.:494.8; NG/GT:545.3; IV Piggyback:4] Out: 8453 [Urine:1550] Nutritional status:  Diet Order            Diet NPO time specified  Diet effective now              Neurologic Exam: Pupils equal round reactive, EOMI, vision intact and count fingers, facial sensation intact Moving all extremities antigravity Sensation intact throughout 3+ bilateral upper extremities, 4+ right knee jerk and ankle jerk (crossed-adductor response), 3+ left knee jerk and ankle jerk  Lab Results: Results for orders placed or performed during the hospital encounter of 02/17/19 (from the past 48 hour(s))  Glucose, capillary     Status: Abnormal   Collection Time: 02/20/19 12:12 PM  Result Value Ref Range   Glucose-Capillary 116 (H) 70 - 99 mg/dL   Comment 1 Notify RN    Comment 2 Document in Chart   Magnesium     Status: None   Collection Time: 02/20/19 12:15 PM  Result Value Ref Range   Magnesium 2.0 1.7 - 2.4 mg/dL    Comment: Performed at [facility], [address] [place], [place] [address]  Phosphorus     Status: Abnormal   Collection Time: 02/20/19 12:15 PM  Result Value Ref Range   Phosphorus 2.0 (L) 2.5 - 4.6 mg/dL    Comment: Performed at [facility] [address], [place], [place] [address]  Glucose, capillary     Status: Abnormal   Collection Time: 02/20/19  3:15 PM  Result Value Ref Range   Glucose-Capillary 121 (H) 70 - 99 mg/dL   Comment 1 Notify RN    Comment 2 Document in Chart   Glucose, capillary     Status: Abnormal   Collection Time: 02/20/19  7:54 PM  Result Value Ref Range   Glucose-Capillary 122 (H) 70 - 99 mg/dL  Glucose, capillary     Status: Abnormal   Collection Time: 02/20/19 11:43 PM  Result Value Ref Range   Glucose-Capillary 112 (H) 70 - 99 mg/dL  Glucose, capillary     Status: None   Collection Time: 02/21/19  3:25 AM  Result Value Ref Range   Glucose-Capillary 86 70 - 99 mg/dL  CBC with Differential/Platelet     Status: Abnormal   Collection Time: 02/21/19  4:04 AM  Result Value Ref Range   WBC 5.9 4.0 - 10.5 K/uL   RBC 3.02 (L) 3.87 - 5.11 MIL/uL  Hemoglobin 8.8 (L) 12.0 - 15.0 g/dL   HCT 26.9 (L) 36.0 - 46.0 %   MCV 89.1 80.0 - 100.0 fL   MCH 29.1 26.0 - 34.0 pg   MCHC 32.7 30.0 - 36.0 g/dL   RDW 14.5 11.5 - 15.5 %   Platelets 124 (L) 150 - 400 K/uL   nRBC 0.0 0.0 - 0.2 %   Neutrophils Relative % 75 %   Neutro Abs 4.4 1.7 - 7.7 K/uL   Lymphocytes Relative 14 %   Lymphs Abs 0.8 0.7 - 4.0 K/uL   Monocytes Relative 7 %   Monocytes Absolute 0.4 0.1 - 1.0 K/uL   Eosinophils Relative 3 %   Eosinophils Absolute 0.2 0.0 - 0.5 K/uL   Basophils Relative 0 %   Basophils Absolute 0.0 0.0 - 0.1 K/uL   Immature Granulocytes 1 %   Abs Immature Granulocytes 0.04 0.00 - 0.07 K/uL    Comment: Performed at [facility] [address], [place], [place] [address]  Magnesium     Status: Abnormal   Collection Time: 02/21/19  4:04 AM  Result Value Ref Range   Magnesium 1.6 (L) 1.7 - 2.4 mg/dL     Comment: Performed at [facility] [address] [place], [place] [address]  Phosphorus     Status: None   Collection Time: 02/21/19  4:04 AM  Result Value Ref Range   Phosphorus 3.6 2.5 - 4.6 mg/dL    Comment: Performed at [facility] [address] [place], [place] [address]  Procalcitonin     Status: None   Collection Time: 02/21/19  4:04 AM  Result Value Ref Range   Procalcitonin 0.52 ng/mL    Comment:        Interpretation: PCT > 0.5 ng/mL and <= 2 ng/mL: Systemic infection (sepsis) is possible, but other conditions are known to elevate PCT as well. (NOTE)       Sepsis PCT Algorithm           Lower Respiratory Tract                                      Infection PCT Algorithm    ----------------------------     ----------------------------         PCT < 0.25 ng/mL                PCT < 0.10 ng/mL         Strongly encourage             Strongly discourage   discontinuation of antibiotics    initiation of antibiotics    ----------------------------     -----------------------------       PCT 0.25 - 0.50 ng/mL            PCT 0.10 - 0.25 ng/mL               OR       >80% decrease in PCT            Discourage initiation of                                            antibiotics      Encourage discontinuation  of antibiotics    ----------------------------     -----------------------------         PCT >= 0.50 ng/mL              PCT 0.26 - 0.50 ng/mL                AND       <80% decrease in PCT             Encourage initiation of                                             antibiotics       Encourage continuation           of antibiotics    ----------------------------     -----------------------------        PCT >= 0.50 ng/mL                  PCT > 0.50 ng/mL               AND         increase in PCT                  Strongly encourage                                      initiation of antibiotics    Strongly encourage escalation           of  antibiotics                                     -----------------------------                                           PCT <= 0.25 ng/mL                                                 OR                                        > 80% decrease in PCT                                     Discontinue / Do not initiate                                             antibiotics Performed at [facility], [address], [place], [place] [address]   Basic metabolic panel     Status: Abnormal   Collection Time: 02/21/19  4:04 AM  Result Value Ref Range   Sodium 139 135 - 145 mmol/L  Potassium 3.7 3.5 - 5.1 mmol/L   Chloride 109 98 - 111 mmol/L   CO2 22 22 - 32 mmol/L   Glucose, Bld 94 70 - 99 mg/dL   BUN 10 6 - 20 mg/dL   Creatinine, Ser 0.54 0.44 - 1.00 mg/dL   Calcium 8.6 (L) 8.9 - 10.3 mg/dL   GFR calc non Af Amer >60 >60 mL/min   GFR calc Af Amer >60 >60 mL/min   Anion gap 8 5 - 15    Comment: Performed at [facility] [address], [place], [place] [address]  Glucose, capillary     Status: None   Collection Time: 02/21/19  8:30 AM  Result Value Ref Range   Glucose-Capillary 90 70 - 99 mg/dL   Comment 1 Notify RN    Comment 2 Document in Chart   Glucose, capillary     Status: Abnormal   Collection Time: 02/21/19 11:19 AM  Result Value Ref Range   Glucose-Capillary 135 (H) 70 - 99 mg/dL   Comment 1 Notify RN    Comment 2 Document in Chart   Glucose, capillary     Status: Abnormal   Collection Time: 02/21/19  3:34 PM  Result Value Ref Range   Glucose-Capillary 102 (H) 70 - 99 mg/dL   Comment 1 Notify RN    Comment 2 Document in Chart   Glucose, capillary     Status: Abnormal   Collection Time: 02/21/19  8:20 PM  Result Value Ref Range   Glucose-Capillary 106 (H) 70 - 99 mg/dL  Glucose, capillary     Status: Abnormal   Collection Time: 02/21/19 11:44 PM  Result Value Ref Range   Glucose-Capillary 110 (H) 70 - 99 mg/dL  Glucose, capillary     Status: None    Collection Time: 02/22/19  3:59 AM  Result Value Ref Range   Glucose-Capillary 93 70 - 99 mg/dL  CBC with Differential/Platelet     Status: Abnormal   Collection Time: 02/22/19  5:11 AM  Result Value Ref Range   WBC 4.5 4.0 - 10.5 K/uL   RBC 3.26 (L) 3.87 - 5.11 MIL/uL   Hemoglobin 9.4 (L) 12.0 - 15.0 g/dL   HCT 29.2 (L) 36.0 - 46.0 %   MCV 89.6 80.0 - 100.0 fL   MCH 28.8 26.0 - 34.0 pg   MCHC 32.2 30.0 - 36.0 g/dL   RDW 14.1 11.5 - 15.5 %   Platelets 138 (L) 150 - 400 K/uL   nRBC 0.0 0.0 - 0.2 %   Neutrophils Relative % 68 %   Neutro Abs 3.1 1.7 - 7.7 K/uL   Lymphocytes Relative 19 %   Lymphs Abs 0.8 0.7 - 4.0 K/uL   Monocytes Relative 9 %   Monocytes Absolute 0.4 0.1 - 1.0 K/uL   Eosinophils Relative 4 %   Eosinophils Absolute 0.2 0.0 - 0.5 K/uL   Basophils Relative 0 %   Basophils Absolute 0.0 0.0 - 0.1 K/uL   Immature Granulocytes 0 %   Abs Immature Granulocytes 0.02 0.00 - 0.07 K/uL    Comment: Performed at [facility], [address] [place], [place] [address]  Magnesium     Status: Abnormal   Collection Time: 02/22/19  5:11 AM  Result Value Ref Range   Magnesium 1.6 (L) 1.7 - 2.4 mg/dL    Comment: Performed at [facility] [address] [place], [place] [address]  Phosphorus     Status: None  Collection Time: 02/22/19  5:11 AM  Result Value Ref Range   Phosphorus 4.6 2.5 - 4.6 mg/dL    Comment: Performed at [facility], [place] [address], [place], [place] [address]  Basic metabolic panel     Status: Abnormal   Collection Time: 02/22/19  5:11 AM  Result Value Ref Range   Sodium 137 135 - 145 mmol/L   Potassium 3.9 3.5 - 5.1 mmol/L   Chloride 108 98 - 111 mmol/L   CO2 20 (L) 22 - 32 mmol/L   Glucose, Bld 102 (H) 70 - 99 mg/dL   BUN 11 6 - 20 mg/dL   Creatinine, Ser 0.33 (L) 0.44 - 1.00 mg/dL   Calcium 8.6 (L) 8.9 - 10.3 mg/dL   GFR calc non Af Amer >60 >60 mL/min   GFR calc Af Amer >60 >60 mL/min   Anion gap 9 5 - 15    Comment:  Performed at [facility], [place] [address], [place], [place] [address]  Glucose, capillary     Status: None   Collection Time: 02/22/19  8:25 AM  Result Value Ref Range   Glucose-Capillary 99 70 - 99 mg/dL   Comment 1 Notify RN    Comment 2 Document in Chart     Recent Results (from the past 240 hour(s))  Respiratory Panel by RT PCR (Flu A&B, Covid) - Nasopharyngeal Swab     Status: None   Collection Time: 02/17/19  9:13 PM   Specimen: Nasopharyngeal Swab  Result Value Ref Range Status   SARS Coronavirus 2 by RT PCR NEGATIVE NEGATIVE Final    Comment: (NOTE) SARS-CoV-2 target nucleic acids are NOT DETECTED. The SARS-CoV-2 RNA is generally detectable in upper respiratoy specimens during the acute phase of infection. The lowest concentration of SARS-CoV-2 viral copies this assay can detect is 131 copies/mL. A negative result does not preclude SARS-Cov-2 infection and should not be used as the sole basis for treatment or other patient management decisions. A negative result may occur with  improper specimen collection/handling, submission of specimen other than nasopharyngeal swab, presence of viral mutation(s) within the areas targeted by this assay, and inadequate number of viral copies (<131 copies/mL). A negative result must be combined with clinical observations, patient history, and epidemiological information. The expected result is Negative. Fact Sheet for Patients:  PinkCheek.be Fact Sheet for Healthcare Providers:  GravelBags.it This test is not yet ap proved or cleared by the [place] FDA and  has been authorized for detection and/or diagnosis of SARS-CoV-2 by FDA under an Emergency Use Authorization (EUA). This EUA will remain  in effect (meaning this test can be used) for the duration of the COVID-19 declaration under Section 564(b)(1) of the Act, 21 U.S.C. section 360bbb-3(b)(1), unless the  authorization is terminated or revoked sooner.    Influenza A by PCR NEGATIVE NEGATIVE Final   Influenza B by PCR NEGATIVE NEGATIVE Final    Comment: (NOTE) The Xpert Xpress SARS-CoV-2/FLU/RSV assay is intended as an aid in  the diagnosis of influenza from Nasopharyngeal swab specimens and  should not be used as a sole basis for treatment. Nasal washings and  aspirates are unacceptable for Xpert Xpress SARS-CoV-2/FLU/RSV  testing. Fact Sheet for Patients: PinkCheek.be Fact Sheet for Healthcare Providers: GravelBags.it This test is not yet approved or cleared by the [place] FDA and  has been authorized for detection and/or diagnosis of SARS-CoV-2 by  FDA under an Emergency Use Authorization (EUA). This EUA will remain  in effect (meaning this test can be used) for the duration of the  Covid-19 declaration under Section 564(b)(1) of the Act, 21  U.S.C. section 360bbb-3(b)(1), unless the authorization is  terminated or revoked. Performed at [facility], [place] [address] [place], [place] [address]   MRSA PCR Screening     Status: None   Collection Time: 02/18/19  2:18 AM   Specimen: Nasopharyngeal  Result Value Ref Range Status   MRSA by PCR NEGATIVE NEGATIVE Final    Comment:        The GeneXpert MRSA Assay (FDA approved for NASAL specimens only), is one component of a comprehensive MRSA colonization surveillance program. It is not intended to diagnose MRSA infection nor to guide or monitor treatment for MRSA infections. Performed at [facility], [place] [address], [place], [place] [address]   Culture, respiratory (non-expectorated)     Status: None   Collection Time: 02/18/19 10:30 AM   Specimen: Tracheal Aspirate; Respiratory  Result Value Ref Range Status   Specimen Description   Final    TRACHEAL ASPIRATE Performed at [facility] [address], [place], [place] [address]    Special Requests   Final    NONE Performed at [facility], [place] [address], [place], [place] [address]    Gram Stain NO WBC SEEN NO ORGANISMS SEEN   Final   Culture   Final    RARE Consistent with normal respiratory flora. Performed at [facility], [place] [address], [place], [place] [address]    Report Status 02/21/2019 FINAL  Final    Lipid Panel No results for input(s): CHOL, TRIG, HDL, CHOLHDL, VLDL, LDLCALC in the last 72 hours.  Studies/Results: DG Chest Port 1 View  Result Date: 02/22/2019 CLINICAL DATA:  Follow-up aspiration pneumonia, ETT/OGT EXAM: PORTABLE CHEST 1 VIEW COMPARISON:  02/20/2019 FINDINGS: Endotracheal tube terminates 4 cm above the carina. Enteric tube terminates in the mid gastric body. Mild right perihilar opacity, unchanged. Right lower lobe opacity has improved. Mild left lower lobe opacity, new. No definite pleural effusion.  No pneumothorax. The heart is normal in size. Stable left subclavian venous catheter terminating at the cavoatrial junction. IMPRESSION: Endotracheal tube terminates 4 cm above the carina. Additional support apparatus as above. Waxing/waning multifocal opacities in this patient with suspected aspiration pneumonia. Electronically Signed   By: Julian Hy M.D.   On: 02/22/2019 09:00    Medications:  Scheduled: . amoxicillin-clavulanate  1 tablet Per Tube Q12H  . calcium chloride  1 g Intravenous Once  . chlorhexidine gluconate (MEDLINE KIT)  15 mL Mouth Rinse BID  . Chlorhexidine Gluconate Cloth  6 each Topical Daily  . mouth rinse  15 mL Mouth Rinse 10 times per day  . pantoprazole sodium  40 mg Per Tube Daily  . sennosides  15 mL Per Tube BID  . sodium chloride flush  10-40 mL Intracatheter Q12H  . valproic acid  500 mg Per Tube BID   Continuous: . dexmedetomidine (PRECEDEX) IV infusion 0.7 mcg/kg/hr (02/22/19 1000)  . feeding supplement (PIVOT 1.5 CAL) 45 mL/hr at  02/22/19 0953  . lactated ringers 50 mL/hr at 02/22/19 1000  . magnesium sulfate bolus IVPB 50 mL/hr at 02/22/19 1000    Assessment: 19 year old female with intentional overdose of 35 to 45 pills of Lexapro and Wellbutrin, brought in with concerns for seizures and posturing.  Had V. tach and cardiac arrest, requiring critical care services.  Total  downtime was 3 minutes. 1. Now awake and starting to follow commands. 2. Presentation most consistent with severe toxic metabolic encephalopathy and serotonin syndrome due to intentional overdose  3. Exam: No further myoclonus, muscle tone has improved and there is no ankle clonus on examination today. However, hyperreflexia persists.   Recommendations: - Continue supportive management per primary team. - LTM over 2 days revealed no seizure activity-some spontaneous movements were likely secondary to posturing or myoclonus and did not appear to be due to seizures - Discontinuing Depakote - Wean off sedation as tolerated  35 minutes spent in the neurological evaluation and management of this critically ill patient.    LOS: 5 days   '@Electronically'$  signed: Dr. Kerney Elbe 02/22/2019  10:49 AM

## 2019-02-23 ENCOUNTER — Inpatient Hospital Stay (HOSPITAL_COMMUNITY): Payer: BC Managed Care – PPO

## 2019-02-23 ENCOUNTER — Encounter (HOSPITAL_COMMUNITY): Payer: Self-pay | Admitting: Pulmonary Disease

## 2019-02-23 DIAGNOSIS — F332 Major depressive disorder, recurrent severe without psychotic features: Secondary | ICD-10-CM | POA: Diagnosis not present

## 2019-02-23 DIAGNOSIS — J690 Pneumonitis due to inhalation of food and vomit: Secondary | ICD-10-CM

## 2019-02-23 DIAGNOSIS — R42 Dizziness and giddiness: Secondary | ICD-10-CM | POA: Diagnosis not present

## 2019-02-23 DIAGNOSIS — T1491XA Suicide attempt, initial encounter: Secondary | ICD-10-CM

## 2019-02-23 DIAGNOSIS — T50902A Poisoning by unspecified drugs, medicaments and biological substances, intentional self-harm, initial encounter: Secondary | ICD-10-CM | POA: Diagnosis not present

## 2019-02-23 DIAGNOSIS — Z4682 Encounter for fitting and adjustment of non-vascular catheter: Secondary | ICD-10-CM | POA: Diagnosis not present

## 2019-02-23 DIAGNOSIS — R519 Headache, unspecified: Secondary | ICD-10-CM | POA: Diagnosis not present

## 2019-02-23 LAB — CBC WITH DIFFERENTIAL/PLATELET
Abs Immature Granulocytes: 0.03 10*3/uL (ref 0.00–0.07)
Basophils Absolute: 0 10*3/uL (ref 0.0–0.1)
Basophils Relative: 1 %
Eosinophils Absolute: 0.1 10*3/uL (ref 0.0–0.5)
Eosinophils Relative: 2 %
HCT: 32.9 % — ABNORMAL LOW (ref 36.0–46.0)
Hemoglobin: 10.7 g/dL — ABNORMAL LOW (ref 12.0–15.0)
Immature Granulocytes: 0 %
Lymphocytes Relative: 8 %
Lymphs Abs: 0.7 10*3/uL (ref 0.7–4.0)
MCH: 28.8 pg (ref 26.0–34.0)
MCHC: 32.5 g/dL (ref 30.0–36.0)
MCV: 88.7 fL (ref 80.0–100.0)
Monocytes Absolute: 0.5 10*3/uL (ref 0.1–1.0)
Monocytes Relative: 6 %
Neutro Abs: 6.9 10*3/uL (ref 1.7–7.7)
Neutrophils Relative %: 83 %
Platelets: 175 10*3/uL (ref 150–400)
RBC: 3.71 MIL/uL — ABNORMAL LOW (ref 3.87–5.11)
RDW: 13.5 % (ref 11.5–15.5)
WBC: 8.2 10*3/uL (ref 4.0–10.5)
nRBC: 0 % (ref 0.0–0.2)

## 2019-02-23 LAB — GLUCOSE, CAPILLARY: Glucose-Capillary: 77 mg/dL (ref 70–99)

## 2019-02-23 LAB — BASIC METABOLIC PANEL
Anion gap: 11 (ref 5–15)
BUN: 13 mg/dL (ref 6–20)
CO2: 24 mmol/L (ref 22–32)
Calcium: 8.8 mg/dL — ABNORMAL LOW (ref 8.9–10.3)
Chloride: 100 mmol/L (ref 98–111)
Creatinine, Ser: 0.61 mg/dL (ref 0.44–1.00)
GFR calc Af Amer: >60 mL/min (ref >60)
GFR calc non Af Amer: >60 mL/min (ref >60)
Glucose, Bld: 90 mg/dL (ref 70–99)
Potassium: 3.7 mmol/L (ref 3.5–5.1)
Sodium: 135 mmol/L (ref 135–145)

## 2019-02-23 LAB — MAGNESIUM: Magnesium: 2.1 mg/dL (ref 1.7–2.4)

## 2019-02-23 LAB — PHOSPHORUS: Phosphorus: 4.3 mg/dL (ref 2.5–4.6)

## 2019-02-23 MED ORDER — GADOBUTROL 1 MMOL/ML IV SOLN
5.0000 mL | Freq: Once | INTRAVENOUS | Status: AC | PRN
  Administered 2019-02-23: 5 mL via INTRAVENOUS

## 2019-02-23 NOTE — Social Work (Addendum)
CSW acknowledging pt arrival to floor-pt s/p overdose with multiple medications. Psychiatry consult placed for pt. CSW will review chart again following psychiatry recommendations.   Octavio Graves, MSW, LCSWA [facility]

## 2019-02-23 NOTE — Progress Notes (Signed)
NAME:  Molly Callahan, MRN:  [id number], DOB:  [birth year], LOS: 6 ADMISSION DATE:  02/17/2019, CONSULTATION DATE:  02/17/2019 REFERRING MD:  Ed physician, CHIEF COMPLAINT:  Drug overdose   Brief History   History available from chart Intentional drug overdose 35 to 45 pills of Lexapro and Wellbutrin around 7:30 PM Was complaining of nausea headache and dizziness Posturing in the ED with seizures  History of depression, anxiety, past history of suicidal ideation  Significant Hospital Events   1/27 Seizures in the ED; intubated, Poison control notified, given activated charcoal.  1/28: QTC prolonged.  Became progressively widened up to 700 cardiology consulted who recommended IV beta-blockade. Intermittently having seizure like activity, initially starting with symmetric facial twitching which increased in intensity and was associated with bilateral arm flexion which was not rhythmic.  Became hypotensive requiring norepinephrine, at 6:57 AM developed V. tach arrest.  CPR lasted less than 3 minutes was defibrillated, given antiarrhythmic therapy, demonstrated successful return of spontaneous circulation.  Neurology consulted, loaded with Depakote.  Spiking fever, , White blood cell count rising, chest x-ray with significant right greater than left airspace disease raising concern for aspiration, Unasyn started. Remains unresponsive, having intermittent seizure-like activity 1/28 - [place] control Recommendations from their stand-point remain supportive. -treat seizures or myoclonus activity ->typical onset is usually at the 18 hr point and she is following this trend. Could last for extended time frame (days).  -no role for HD (discussed specifically) -continue current supportive care -watch QT closely 1/29 thru 1/31->no seizures via LTM. Clinically improving. Shock resolved.  2/1 following commands. Weaning but paradoxical on SBT. Versed stopped.  Consults:  PCCM  Procedures:  Tracheal  intubation 02/17/2019 Left [place] CVL 1/27>>> 02/23/2019 Left fem aline 1/27>>> discontinued  Significant Diagnostic Tests:  CT head 02/17/2019-no significant abnormality  Micro Data:  Respiratory culture 1/28>>>neg Antimicrobials:  Unasyn 1/28>> 02/22/2019 02/22/2019 Augmentin>> Interim history/subjective:   No acute events over night   Objective   Blood pressure 112/72, pulse 78, temperature 98.4 F (36.9 C), temperature source Oral, resp. rate (!) 24, height 5' 6.5" (1.689 m), weight 51.1 kg, SpO2 97 %.    Vent Mode: PRVC FiO2 (%):  [50 %] 50 % Set Rate:  [20 bmp] 20 bmp Vt Set:  [400 mL] 400 mL PEEP:  [5 cmH20] 5 cmH20 Pressure Support:  [15 cmH20] 15 cmH20   Intake/Output Summary (Last 24 hours) at 02/23/2019 0747 Last data filed at 02/23/2019 0500 Gross per 24 hour  Intake 2081.5 ml  Output 2876 ml  Net -794.5 ml   Filed Weights   02/19/19 0500 02/21/19 0404 02/22/19 0456  Weight: 50 kg 50.5 kg 51.1 kg    Examination: General: Well-nourished well-developed female no acute distress at rest HEENT: Currently on room air.,  Voice is hoarse but understandable, pupils equal react light Neuro: Mostly intact no focal defects appreciated CV: Heart sounds regular regular rhythm PULM: Diminished in the bases  GI: soft, bsx4 active  GU: Voids Extremities: warm/dry,  edema  Skin: no rashes or lesions   Resolved Hospital Problem list   AKI (resolved) Circulatory shock (felt mix of meds and septic shock from aspiration)-->resolved 1/28 VT arrest 1/30  2/2 prolonged QTc-->time to ROSC < 3 minutes. defib x 1  Assessment & Plan:    Intentional drug overdose complicated by Serotonin syndrome and myoclonus  -Background history of anxiety/depression -eeg neg for seizures; LTM stopped.  -s/p activated charcoal and started on Depakote.  -myoclonus improved.  -now interactive  and following commands  Plan Continue to monitor Simplify pharmaceutical interventions   Acute hypoxic  respiratory failure in the setting of diffuse bilateral right greater than left pulmonary infiltrates: - likely represents aspiration pneumonia - - -sputum neg -last cxr on 1/30 predom right sided airspace disease.  Plan Self extubated 02/22/2019 Currently on room air Some hoarseness following self extubation will seek swallowing evaluations prior to initiating p.o. intake She has been transitioned to p.o. antibiotics will wait till swallowing evaluation is completed before we initiate p.o. if unable to take p.o.'s we will resume IV antibiotics. She can be transitioned out of the intensive care unit and to the [facility] hospitalist service as of 02/23/2018  Electrolyte imbalance: hypomagnesemia  Plan Magnesium repleted and normal on electrolyte package  Anemia critical illness  Recent Labs    02/22/19 0511 02/23/19 0544  HGB 9.4* 10.7*     - no active bleeding Plan Transfuse per protocol  MDD with SI S/p intentional OD of antidepressant Rxs  Plan Plan for psych consult  General ICU care Plan Discontinue central line Currently n.p.o. until swallowing evaluation She is ready to move out of the intensive care unit as of 02/23/2019  Best practice:  Diet: NPO schedule for swallowing evaluation 02/23/2019  VAP protocol (if indicated): In place DVT prophylaxis: In place GI prophylaxis:  Glucose control: Monitor Mobility: Bedrest Code Status: Full code Family Communication: 02/23/2019 patient updated at bedside Disposition: ICU    App cct 23 min  Brett Canales Yahira Timberman ACNP Acute Care Nurse Practitioner [facility] Please consult Amion 02/23/2019, 7:47 AM

## 2019-02-23 NOTE — Evaluation (Signed)
Occupational Therapy Evaluation Patient Details Name: Molly Callahan MRN: [id number] DOB: [birth year] Today's Date: 02/23/2019    History of Present Illness Pt is a 19 y/o female admitted after OD on lexapro and Wellbutrin.  Posturing in the ED with seizures, intubated for airway protection.  Extubated 2/1.  PMHx: suicidal ideation, depression, anxiety d/o.   Clinical Impression   Pt PTA: living with family 4th child out of 7 total at home. Pt reports independence with ADL and mobility; pt likes toplay the ukulele. Pt currently appears close to baseline functioning. Pt performing UB ADL with supervisionA;LB ADL with minguardA to minA in standing. Pt with slight tremors in BUEs sometimes incoordinated movements requiring increased time for tasks- could benefit frm GMC/FMC HEP. Pt would benefit from continued OT skilled services for ADL, mobility and safety acutely.     Follow Up Recommendations  No OT follow up    Equipment Recommendations  None recommended by OT    Recommendations for Other Services       Precautions / Restrictions Precautions Precautions: Other (comment)(suicide precautions) Restrictions Weight Bearing Restrictions: No      Mobility Bed Mobility Overal bed mobility: Modified Independent                Transfers Overall transfer level: Needs assistance Equipment used: None Transfers: Sit to/from Stand Sit to Stand: Min guard;Supervision         General transfer comment: Assist for initial standing balance; pt prefers to haev feet close together in standing, but able to spread apart for stability.      Balance Overall balance assessment: Mild deficits observed, not formally tested   Sitting balance-Leahy Scale: Good       Standing balance-Leahy Scale: Fair Standing balance comment: minimal swaying to L side; getting closer to objects on L > R side                           ADL either performed or assessed with clinical judgement    ADL Overall ADL's : Needs assistance/impaired Eating/Feeding: Modified independent;Sitting   Grooming: Supervision/safety;Standing   Upper Body Bathing: Supervision/ safety;Standing   Lower Body Bathing: Min guard;Sitting/lateral leans;Sit to/from stand   Upper Body Dressing : Supervision/safety;Standing   Lower Body Dressing: Min guard;Sit to/from stand   Toilet Transfer: Supervision/safety;Ambulation   Toileting- Clothing Manipulation and Hygiene: Min guard;Minimal assistance;Sitting/lateral lean;Sit to/from stand Toileting - Clothing Manipulation Details (indicate cue type and reason): due to IV positions     Functional mobility during ADLs: Min guard;Cueing for safety General ADL Comments: Pt appears close to baseline functioning. Pt performing UB ADL with supervisionA;LB ADL with minguardA to minA in standing. Pt with slight tremors in BUEs sometimes incoordinated movements requiring increased time for tasks.      Vision Baseline Vision/History: No visual deficits Vision Assessment?: Yes Eye Alignment: Within Functional Limits Ocular Range of Motion: Within Functional Limits Alignment/Gaze Preference: Within Defined Limits Tracking/Visual Pursuits: Able to track stimulus in all quads without difficulty Additional Comments: Able to read the clock, but said "12:25pm instead of 11:25am" Pt pointed eyes downward when asked to correct it.     Perception     Praxis      Pertinent Vitals/Pain Pain Assessment: Faces Faces Pain Scale: Hurts a little bit Pain Location: IV site Pain Descriptors / Indicators: Discomfort Pain Intervention(s): Monitored during session     Hand Dominance Right   Extremity/Trunk Assessment Upper Extremity Assessment Upper Extremity Assessment:  Defer to OT evaluation RUE Deficits / Details: 3+/5 MM grade, tremors noted RUE Coordination: decreased gross motor LUE Deficits / Details: 3+/5 MM grade, tremors noted LUE Coordination: decreased  gross motor   Lower Extremity Assessment Lower Extremity Assessment: Overall WFL for tasks assessed(proximal weakness for days of inactivity)   Cervical / Trunk Assessment Cervical / Trunk Assessment: Normal   Communication Communication Communication: No difficulties;Other (comment)   Cognition Arousal/Alertness: Awake/alert Behavior During Therapy: (overly happy) Overall Cognitive Status: Within Functional Limits for tasks assessed                                     General Comments  vss    Exercises     Shoulder Instructions      Home Living Family/patient expects to be discharged to:: Private residence Living Arrangements: Parent Available Help at Discharge: Family;Available 24 hours/day Type of Home: House Home Access: Stairs to enter   Entrance Stairs-Rails: None Home Layout: Multi-level;Bed/bath upstairs Alternate Level Stairs-Number of Steps: full flight Alternate Level Stairs-Rails: Left;Right Bathroom Shower/Tub: Occupational psychologist: Standard     Home Equipment: None          Prior Functioning/Environment Level of Independence: Independent        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; used to work        OT Problem List: Decreased activity tolerance;Impaired balance (sitting and/or standing)      OT Treatment/Interventions: Self-care/ADL training;Therapeutic exercise;Therapeutic activities;Cognitive remediation/compensation;Patient/family education;Balance training;Energy conservation    OT Goals(Current goals can be found in the care plan section) Acute Rehab OT Goals Patient Stated Goal: pt did not relate goals, but agrees she needs to gain strength and independence OT Goal Formulation: With patient Time For Goal Achievement: 03/09/19 Potential to Achieve Goals: Good ADL Goals Pt Will Perform Lower Body Dressing: with modified independence;sit to/from stand Pt Will Perform Toileting - Clothing Manipulation and hygiene: with  modified independence;sit to/from stand Additional ADL Goal #1: Pt will improve to modified independent with OOB ADL tasks with good safety awareness Additional ADL Goal #2: Pt will increase GMC/FMC with BUE HEP provided.  OT Frequency: Min 2X/week   Barriers to D/C:            Co-evaluation PT/OT/SLP Co-Evaluation/Treatment: Yes Reason for Co-Treatment: Necessary to address cognition/behavior during functional activity;To address functional/ADL transfers PT goals addressed during session: Mobility/safety with mobility OT goals addressed during session: ADL's and self-care      AM-PAC OT "6 Clicks" Daily Activity     Outcome Measure Help from another person eating meals?: None Help from another person taking care of personal grooming?: A Little Help from another person toileting, which includes using toliet, bedpan, or urinal?: A Little Help from another person bathing (including washing, rinsing, drying)?: A Little Help from another person to put on and taking off regular upper body clothing?: None Help from another person to put on and taking off regular lower body clothing?: A Little 6 Click Score: 20   End of Session Nurse Communication: Mobility status  Activity Tolerance: Patient tolerated treatment well Patient left: Other (comment);with nursing/sitter in room(sitting on commode with sitter in room)  OT Visit Diagnosis: Unsteadiness on feet (R26.81);Muscle weakness (generalized) (M62.81)                Time: 1100-1120 OT Time Calculation (min): 20 min Charges:  OT General Charges $OT Visit: 1 Visit OT Evaluation $  OT Eval Moderate Complexity: 1 Mod  Flora Lipps OTR/L Acute Rehabilitation Services Pager: [phone] Office: [phone]  Rosibel Giacobbe C 02/23/2019, 12:47 PM

## 2019-02-23 NOTE — Evaluation (Addendum)
Clinical/Bedside Swallow Evaluation Patient Details  Name: Molly Callahan MRN: [id number] Date of Birth: 09-May-2000  Today's Date: 02/23/2019 Time: SLP Start Time (ACUTE ONLY): 1025 SLP Stop Time (ACUTE ONLY): 1040 SLP Time Calculation (min) (ACUTE ONLY): 15 min  Past Medical History:  Past Medical History:  Diagnosis Date  . Anxiety   . 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 Depression   . Suicidal ideation 03/19/2016   Past Surgical History:  Past Surgical History:  Procedure Laterality Date  . NO PAST SURGERIES     HPI: WINOLA DRUM is a 19 y.o. female.  Presents to ER after intentional overdose. Estimated 35-45 tablets of Lexapro 20 mg and 35 to 45 tablets Wellbutrin XL 300 mg. Pt was intubated on 1/27, and self extubated on 2/1. History of depression, anxiety, past history of suicidal ideation.  Assessment / Plan / Recommendation Clinical Impression  Pt alert and cooperative t/o session. Pt's vocal quality noted to be aphonic; oral mech unremarkable. Multiple swallows and immediate/intermittent wet coughing following ice chips and thin liquids. Pt not observed to use multiple swallows with purees, and only x1 delayed cough noted. Considering pt's current vocal quality and s/sx of dysphagia/aspiration, ice chips sparingly following oral care with nursing and meds crushed in purees recommended at this time. Will continue to follow.   SLP Visit Diagnosis: Dysphagia, oropharyngeal phase (R13.12)    Aspiration Risk  Moderate aspiration risk    Diet Recommendation Ice chips PRN after oral care;NPO except meds   Medication Administration: Crushed with puree Supervision: Full supervision/cueing for compensatory strategies Compensations: Small sips/bites;Slow rate Postural Changes: Seated upright at 90 degrees    Other  Recommendations Oral Care Recommendations: Oral care BID   Follow up Recommendations Other (comment)(tbd)      Frequency and Duration min 2x/week  2  weeks       Prognosis Prognosis for Safe Diet Advancement: Fair      Swallow Study   General Type of Study: Bedside Swallow Evaluation Previous Swallow Assessment: none Diet Prior to this Study: NPO Temperature Spikes Noted: No Respiratory Status: Room air History of Recent Intubation: Yes Length of Intubations (days): 4 days Date extubated: 02/22/19 Behavior/Cognition: Alert;Cooperative Oral Cavity Assessment: Within Functional Limits Oral Care Completed by SLP: No Oral Cavity - Dentition: Adequate natural dentition Vision: Functional for self-feeding Self-Feeding Abilities: Able to feed self Patient Positioning: Upright in bed Baseline Vocal Quality: Aphonic Volitional Cough: Wet Volitional Swallow: Able to elicit    Oral/Motor/Sensory Function Overall Oral Motor/Sensory Function: Within functional limits   Ice Chips Ice chips: Impaired Presentation: Spoon Pharyngeal Phase Impairments: Multiple swallows;Throat Clearing - Delayed   Thin Liquid Thin Liquid: Impaired Presentation: Self Fed;Spoon;Cup Pharyngeal  Phase Impairments: Multiple swallows;Throat Clearing - Delayed;Cough - Immediate    Nectar Thick Nectar Thick Liquid: Not tested   Honey Thick Honey Thick Liquid: Not tested   Puree Puree: Impaired Presentation: Spoon;Self Fed Pharyngeal Phase Impairments: Cough - Delayed   Solid     Solid: Not tested     Maudry Mayhew, Student SLP Office: [phone]  02/23/2019,10:55 AM

## 2019-02-23 NOTE — Evaluation (Signed)
Physical Therapy Evaluation Patient Details Name: Molly Callahan MRN: [id number] DOB: August 30, 2000 Today's Date: 02/23/2019   History of Present Illness  pt is a 19 y/o female admitted after OD on lexapro and Wellbutrin.  Posturing in the ED with seizures, intubated for airway protection.  Extubated 2/1.  PMHx: suicidal ideation, depression, anxiety d/o.  Clinical Impression  Pt admitted with/for OD with intubation and inactivity for days.  Pt at min guard to supervision level at her worst.  Pt currently limited functionally due to the problems listed below.  (see problems list.)  Pt will benefit from PT to maximize function and safety to be able to get home safely with available assist .     Follow Up Recommendations No PT follow up    Equipment Recommendations  None recommended by PT    Recommendations for Other Services       Precautions / Restrictions Precautions Precautions: Other (comment)(suicide precautions) Restrictions Weight Bearing Restrictions: No      Mobility  Bed Mobility Overal bed mobility: Modified Independent                Transfers Overall transfer level: Needs assistance Equipment used: None Transfers: Sit to/from Stand Sit to Stand: Min guard;Supervision            Ambulation/Gait Ambulation/Gait assistance: Min Emergency planning/management officer (Feet): 300 Feet Assistive device: None Gait Pattern/deviations: Step-through pattern Gait velocity: slower Gait velocity interpretation: 1.31 - 2.62 ft/sec, indicative of limited community ambulator General Gait Details: generally steady with episodes of drift.  pt able to speed up, but does not speed up significantly  Stairs            Wheelchair Mobility    Modified Rankin (Stroke Patients Only)       Balance Overall balance assessment: Mild deficits observed, not formally tested                                           Pertinent Vitals/Pain Pain Assessment:  Faces Faces Pain Scale: Hurts a little bit Pain Location: IV site Pain Descriptors / Indicators: Discomfort Pain Intervention(s): Monitored during session    Home Living Family/patient expects to be discharged to:: Private residence Living Arrangements: Parent Available Help at Discharge: Family;Available 24 hours/day Type of Home: House Home Access: Stairs to enter Entrance Stairs-Rails: None   Home Layout: Multi-level;Bed/bath upstairs Home Equipment: None      Prior Function Level of Independence: Independent         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; used to work     Higher education careers adviser   Dominant Hand: Right    Extremity/Trunk Assessment   Upper Extremity Assessment Upper Extremity Assessment: Defer to OT evaluation    Lower Extremity Assessment Lower Extremity Assessment: Overall WFL for tasks assessed(proximal weakness for days of inactivity)    Cervical / Trunk Assessment Cervical / Trunk Assessment: Normal  Communication   Communication: No difficulties;Other (comment)  Cognition Arousal/Alertness: Awake/alert Behavior During Therapy: (overly happy) Overall Cognitive Status: Within Functional Limits for tasks assessed                                        General Comments General comments (skin integrity, edema, etc.): vss    Exercises     Assessment/Plan    PT  Assessment Patient needs continued PT services  PT Problem List Decreased strength;Decreased activity tolerance;Decreased balance;Decreased mobility       PT Treatment Interventions Gait training;Stair training;Functional mobility training;Therapeutic activities;Balance training;Patient/family education    PT Goals (Current goals can be found in the Care Plan section)  Acute Rehab PT Goals Patient Stated Goal: pt did not relate goals, but agrees she needs to gain strength and independence PT Goal Formulation: With patient Time For Goal Achievement: 03/02/19 Potential to Achieve  Goals: Good    Frequency Min 3X/week   Barriers to discharge        Co-evaluation PT/OT/SLP Co-Evaluation/Treatment: Yes Reason for Co-Treatment: Necessary to address cognition/behavior during functional activity;To address functional/ADL transfers PT goals addressed during session: Mobility/safety with mobility         AM-PAC PT "6 Clicks" Mobility  Outcome Measure Help needed turning from your back to your side while in a flat bed without using bedrails?: None Help needed moving from lying on your back to sitting on the side of a flat bed without using bedrails?: None Help needed moving to and from a bed to a chair (including a wheelchair)?: A Little Help needed standing up from a chair using your arms (e.g., wheelchair or bedside chair)?: A Little Help needed to walk in hospital room?: A Little Help needed climbing 3-5 steps with a railing? : A Little 6 Click Score: 20    End of Session   Activity Tolerance: Patient tolerated treatment well Patient left: in bed;with call bell/phone within reach;with nursing/sitter in room Nurse Communication: Mobility status PT Visit Diagnosis: Unsteadiness on feet (R26.81);Muscle weakness (generalized) (M62.81);Other abnormalities of gait and mobility (R26.89)    Time: 2836-6294 PT Time Calculation (min) (ACUTE ONLY): 17 min   Charges:   PT Evaluation $PT Eval Moderate Complexity: 1 Mod          02/23/2019  Ginger Carne., PT Acute Rehabilitation Services [phone]  (pager) [phone]  (office)  Tessie Fass Otila Starn 02/23/2019, 11:54 AM

## 2019-02-23 NOTE — Consult Note (Signed)
Telepsych Consultation   Reason for Consult:  Suicide attempt Referring Physician:  Grace Bushy. Minor Location of Patient: [facility] Location of Provider: [facility]  Patient Identification: Molly Callahan MRN:  [id number] Principal Diagnosis: MDD (major depressive disorder), recurrent severe, without psychosis ([place]) Diagnosis:  Principal Problem:   MDD (major depressive disorder), recurrent severe, without psychosis ([place]) Active Problems:   Overdose   Acute respiratory failure with hypoxemia ([place])   Aspiration pneumonia ([place])   Prolonged QT interval   Seizure ([place])   Encounter for central line placement   Malnutrition of moderate degree   Serotonin syndrome   Encephalopathy   Suicide attempt ([place])   Total Time spent with patient: 30 minutes  Subjective:   Molly Callahan is a 19 y.o. female patient admitted to hospital after an overdose of 35-40 pills consisting of Lexapro and Wellbutrin.    HPI:  Molly Callahan, 19 y.o., female patient seen via tele psych by this provider, Dr. Parke Poisson; and chart reviewed on 02/23/19.  On evaluation Molly Callahan reports a chronic history of depression that has been worsening over the last year.  Patient states a recent break up with boyfriend and reports her mood as being kind of washed out; not enjoying the usual things in life.  Patient reports being in [place] during the summer with her ex boyfriend and they both returned home September 2020 and break up recently.  Patient also reports a history of cutting and is unsure of the last time she cut; but feels it has been recent.  Patient denies homicidal ideation, psychosis, and paranoia.  Patient does have a prior history of suicidal thoughts but does not remember if she has ever tried to commit suicide before.  She has had a prior psychiatric hospitalization for suicidal thoughts.  Patient states that she does have outpatient psychiatric services but hasn't had therapy in several months.   Reports she has been on psychotropic medications for a while. States that she is currently taking Lexapro and Wellbutrin which she feels has worked for her.  Today patient states that she is glad that she is alive and is not currently having any suicidal thoughts.  Currently patient living with parents, unemployed, and not in school.   During evaluation Molly Callahan is alert/oriented x 4;  Patient having difficulty remembering what happened just before hospital admission.  Patient is calm/cooperative; and mood is congruent with affect.  She does not appear to be responding to internal/external stimuli or delusional thoughts.  Patient denies suicidal/self-harm/homicidal ideation, psychosis, and paranoia.  Patient answered question appropriately.  Father at patients bedside; supportive.     Past Psychiatric History: Major depressive disorder, PTSD, self injurious behavior Risk to Self:  yes Risk to Others:  No Prior Inpatient Therapy:  Yes Prior Outpatient Therapy:  Yes  Past Medical History:  Past Medical History:  Diagnosis Date  . Anxiety   . 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 Depression   . Suicidal ideation 03/19/2016    Past Surgical History:  Procedure Laterality Date  . NO PAST SURGERIES     Family History: History reviewed. No pertinent family history. Family Psychiatric  History: Unaware Social History:  Social History   Substance and Sexual Activity  Alcohol Use No     Social History   Substance and Sexual Activity  Drug Use No    Social History   Socioeconomic History  . Marital status: Single    Spouse name: Not  on file  . Number of children: Not on file  . Years of education: Not on file  . Highest education level: Not on file  Occupational History  . Not on file  Tobacco Use  . Smoking status: Current Some Day Smoker  . Smokeless tobacco: Never Used  Substance and Sexual Activity  . Alcohol use: No  . Drug use: No  . Sexual activity: Yes     Birth control/protection: Condom  Other Topics Concern  . Not on file  Social History Narrative  . Not on file   Social Determinants of Health   Financial Resource Strain:   . Difficulty of Paying Living Expenses: Not on file  Food Insecurity:   . Worried About Charity fundraiser in the Last Year: Not on file  . Ran Out of Food in the Last Year: Not on file  Transportation Needs:   . Lack of Transportation (Medical): Not on file  . Lack of Transportation (Non-Medical): Not on file  Physical Activity:   . Days of Exercise per Week: Not on file  . Minutes of Exercise per Session: Not on file  Stress:   . Feeling of Stress : Not on file  Social Connections:   . Frequency of Communication with Friends and Family: Not on file  . Frequency of Social Gatherings with Friends and Family: Not on file  . Attends Religious Services: Not on file  . Active Member of Clubs or Organizations: Not on file  . Attends Archivist Meetings: Not on file  . Marital Status: Not on file   Additional Social History:    Allergies:   Allergies  Allergen Reactions  . Pollen Extract Other (See Comments)    Teary eyes    Labs:  Results for orders placed or performed during the hospital encounter of 02/17/19 (from the past 48 hour(s))  Glucose, capillary     Status: Abnormal   Collection Time: 02/21/19  8:20 PM  Result Value Ref Range   Glucose-Capillary 106 (H) 70 - 99 mg/dL  Glucose, capillary     Status: Abnormal   Collection Time: 02/21/19 11:44 PM  Result Value Ref Range   Glucose-Capillary 110 (H) 70 - 99 mg/dL  Glucose, capillary     Status: None   Collection Time: 02/22/19  3:59 AM  Result Value Ref Range   Glucose-Capillary 93 70 - 99 mg/dL  CBC with Differential/Platelet     Status: Abnormal   Collection Time: 02/22/19  5:11 AM  Result Value Ref Range   WBC 4.5 4.0 - 10.5 K/uL   RBC 3.26 (L) 3.87 - 5.11 MIL/uL   Hemoglobin 9.4 (L) 12.0 - 15.0 g/dL   HCT 29.2 (L) 36.0 -  46.0 %   MCV 89.6 80.0 - 100.0 fL   MCH 28.8 26.0 - 34.0 pg   MCHC 32.2 30.0 - 36.0 g/dL   RDW 14.1 11.5 - 15.5 %   Platelets 138 (L) 150 - 400 K/uL   nRBC 0.0 0.0 - 0.2 %   Neutrophils Relative % 68 %   Neutro Abs 3.1 1.7 - 7.7 K/uL   Lymphocytes Relative 19 %   Lymphs Abs 0.8 0.7 - 4.0 K/uL   Monocytes Relative 9 %   Monocytes Absolute 0.4 0.1 - 1.0 K/uL   Eosinophils Relative 4 %   Eosinophils Absolute 0.2 0.0 - 0.5 K/uL   Basophils Relative 0 %   Basophils Absolute 0.0 0.0 - 0.1 K/uL   Immature  Granulocytes 0 %   Abs Immature Granulocytes 0.02 0.00 - 0.07 K/uL    Comment: Performed at [facility], [place] [address], [place], [place] [address]  Magnesium     Status: Abnormal   Collection Time: 02/22/19  5:11 AM  Result Value Ref Range   Magnesium 1.6 (L) 1.7 - 2.4 mg/dL    Comment: Performed at [facility] [address], [place], [place] [address]  Phosphorus     Status: None   Collection Time: 02/22/19  5:11 AM  Result Value Ref Range   Phosphorus 4.6 2.5 - 4.6 mg/dL    Comment: Performed at [facility] [address] [place], [place] [address]  Basic metabolic panel     Status: Abnormal   Collection Time: 02/22/19  5:11 AM  Result Value Ref Range   Sodium 137 135 - 145 mmol/L   Potassium 3.9 3.5 - 5.1 mmol/L   Chloride 108 98 - 111 mmol/L   CO2 20 (L) 22 - 32 mmol/L   Glucose, Bld 102 (H) 70 - 99 mg/dL   BUN 11 6 - 20 mg/dL   Creatinine, Ser 0.33 (L) 0.44 - 1.00 mg/dL   Calcium 8.6 (L) 8.9 - 10.3 mg/dL   GFR calc non Af Amer >60 >60 mL/min   GFR calc Af Amer >60 >60 mL/min   Anion gap 9 5 - 15    Comment: Performed at [facility], [place] [address], [place], [place] [address]  Glucose, capillary     Status: None   Collection Time: 02/22/19  8:25 AM  Result Value Ref Range   Glucose-Capillary 99 70 - 99 mg/dL   Comment 1 Notify RN    Comment 2 Document in Chart   Glucose, capillary     Status: None   Collection Time: 02/22/19 12:00 PM   Result Value Ref Range   Glucose-Capillary 84 70 - 99 mg/dL   Comment 1 Notify RN    Comment 2 Document in Chart   Glucose, capillary     Status: None   Collection Time: 02/22/19  8:08 PM  Result Value Ref Range   Glucose-Capillary 80 70 - 99 mg/dL  Glucose, capillary     Status: None   Collection Time: 02/22/19 11:42 PM  Result Value Ref Range   Glucose-Capillary 81 70 - 99 mg/dL  Glucose, capillary     Status: None   Collection Time: 02/23/19  4:01 AM  Result Value Ref Range   Glucose-Capillary 77 70 - 99 mg/dL  CBC with Differential/Platelet     Status: Abnormal   Collection Time: 02/23/19  5:44 AM  Result Value Ref Range   WBC 8.2 4.0 - 10.5 K/uL   RBC 3.71 (L) 3.87 - 5.11 MIL/uL   Hemoglobin 10.7 (L) 12.0 - 15.0 g/dL   HCT 32.9 (L) 36.0 - 46.0 %   MCV 88.7 80.0 - 100.0 fL   MCH 28.8 26.0 - 34.0 pg   MCHC 32.5 30.0 - 36.0 g/dL   RDW 13.5 11.5 - 15.5 %   Platelets 175 150 - 400 K/uL   nRBC 0.0 0.0 - 0.2 %   Neutrophils Relative % 83 %   Neutro Abs 6.9 1.7 - 7.7 K/uL   Lymphocytes Relative 8 %   Lymphs Abs 0.7 0.7 - 4.0 K/uL   Monocytes Relative 6 %   Monocytes Absolute 0.5 0.1 - 1.0 K/uL   Eosinophils Relative 2 %   Eosinophils  Absolute 0.1 0.0 - 0.5 K/uL   Basophils Relative 1 %   Basophils Absolute 0.0 0.0 - 0.1 K/uL   Immature Granulocytes 0 %   Abs Immature Granulocytes 0.03 0.00 - 0.07 K/uL    Comment: Performed at [facility], [place] [address], [place], [place] [address]  Magnesium     Status: None   Collection Time: 02/23/19  5:44 AM  Result Value Ref Range   Magnesium 2.1 1.7 - 2.4 mg/dL    Comment: Performed at [facility] [address] [place], [place] [address]  Phosphorus     Status: None   Collection Time: 02/23/19  5:44 AM  Result Value Ref Range   Phosphorus 4.3 2.5 - 4.6 mg/dL    Comment: Performed at [facility] [address] [place], [place] [address]  Basic metabolic panel     Status: Abnormal   Collection Time:  02/23/19  5:44 AM  Result Value Ref Range   Sodium 135 135 - 145 mmol/L   Potassium 3.7 3.5 - 5.1 mmol/L   Chloride 100 98 - 111 mmol/L   CO2 24 22 - 32 mmol/L   Glucose, Bld 90 70 - 99 mg/dL   BUN 13 6 - 20 mg/dL   Creatinine, Ser 0.61 0.44 - 1.00 mg/dL   Calcium 8.8 (L) 8.9 - 10.3 mg/dL   GFR calc non Af Amer >60 >60 mL/min   GFR calc Af Amer >60 >60 mL/min   Anion gap 11 5 - 15    Comment: Performed at [facility] [address] [place], [place] [address]    Medications:  Current Facility-Administered Medications  Medication Dose Route Frequency Provider Last Rate Last Admin  . amoxicillin-clavulanate (AUGMENTIN) 875-125 MG per tablet 1 tablet  1 tablet Per Tube Q12H Minor, Grace Bushy, NP   1 tablet at 02/23/19 1207  . chlorhexidine gluconate (MEDLINE KIT) (PERIDEX) 0.12 % solution 15 mL  15 mL Mouth Rinse BID Minor, Grace Bushy, NP   15 mL at 02/23/19 0852  . Chlorhexidine Gluconate Cloth 2 % PADS 6 each  6 each Topical Daily Minor, Grace Bushy, NP   6 each at 02/23/19 1049  . ibuprofen (ADVIL) chewable tablet 200 mg  200 mg Oral Q8H PRN Minor, Grace Bushy, NP      . lactated ringers infusion   Intravenous Continuous Minor, Grace Bushy, NP 50 mL/hr at 02/23/19 1303 New Bag at 02/23/19 1303  . MEDLINE mouth rinse  15 mL Mouth Rinse BID Minor, Grace Bushy, NP   15 mL at 02/23/19 1049  . metoCLOPramide (REGLAN) injection 10 mg  10 mg Intravenous Q8H PRN Minor, Grace Bushy, NP   10 mg at 02/22/19 2003  . phenol (CHLORASEPTIC) mouth spray 1 spray  1 spray Mouth/Throat PRN Minor, Grace Bushy, NP   1 spray at 02/23/19 0201  . sennosides (SENOKOT) 8.8 MG/5ML syrup 15 mL  15 mL Per Tube BID Minor, Grace Bushy, NP   15 mL at 02/22/19 1007    Musculoskeletal: Strength & Muscle Tone: Unable to determine at this time Gait & Station: Did not see patient ambulate Patient leans: N/A  Psychiatric Specialty Exam: Physical Exam  Review of Systems  Blood pressure 119/75, pulse 78, temperature 99.3 F  (37.4 C), temperature source Oral, resp. rate 18, height 5' 6.5" (1.689 m), weight 51.1 kg, SpO2 100 %.Body mass index is 17.91 kg/m.  General Appearance: Casual  Eye Contact:  Good  Speech:  Clear and Coherent and Whispered related to sore throat  Volume:  Decreased  Mood:  Reports mood is better "hungry" glad that she is alive  Affect:  Depressed  Thought Process:  Coherent, Goal Directed and Descriptions of Associations: Intact  Orientation:  Full (Time, Place, and Person)  Thought Content:  WDL and Logical  Suicidal Thoughts:  Suicide attempt.  Denies suicidal thoughts at this time  Homicidal Thoughts:  No  Memory:  Immediate;   Fair Recent;   Fair  Judgement:  Intact  Insight:  Fair and Present  Psychomotor Activity:  Decreased  Concentration:  Concentration: Good and Attention Span: Good  Recall:  [place] of Knowledge:  Good  Language:  Good  Akathisia:  No  Handed:  Right  AIMS (if indicated):     Assets:  Communication Skills Desire for Improvement Housing Social Support  ADL's:  Intact  Cognition:  WNL  Sleep:        Treatment Plan Summary: Plan Psychiatric admission once medically cleared     Disposition: Recommend psychiatric Inpatient admission when medically cleared.  This service was provided via telemedicine using a 2-way, interactive audio and video technology.  Names of all persons participating in this telemedicine service and their role in this encounter. Name: Earleen Newport Role: NP  Name: Dr. Parke Poisson Role: Psychiatrist  Name: Herbert Pun Role: Patient   Name:  Role:     Earleen Newport, NP 02/23/2019 4:53 PM

## 2019-02-24 ENCOUNTER — Inpatient Hospital Stay (HOSPITAL_COMMUNITY): Payer: BC Managed Care – PPO

## 2019-02-24 LAB — CBC WITH DIFFERENTIAL/PLATELET
Abs Immature Granulocytes: 0.13 10*3/uL — ABNORMAL HIGH (ref 0.00–0.07)
Basophils Absolute: 0.1 10*3/uL (ref 0.0–0.1)
Basophils Relative: 1 %
Eosinophils Absolute: 0.2 10*3/uL (ref 0.0–0.5)
Eosinophils Relative: 2 %
HCT: 38 % (ref 36.0–46.0)
Hemoglobin: 12.5 g/dL (ref 12.0–15.0)
Immature Granulocytes: 2 %
Lymphocytes Relative: 18 %
Lymphs Abs: 1.2 10*3/uL (ref 0.7–4.0)
MCH: 28.4 pg (ref 26.0–34.0)
MCHC: 32.9 g/dL (ref 30.0–36.0)
MCV: 86.4 fL (ref 80.0–100.0)
Monocytes Absolute: 0.4 10*3/uL (ref 0.1–1.0)
Monocytes Relative: 6 %
Neutro Abs: 4.8 10*3/uL (ref 1.7–7.7)
Neutrophils Relative %: 71 %
Platelets: 231 10*3/uL (ref 150–400)
RBC: 4.4 MIL/uL (ref 3.87–5.11)
RDW: 12.8 % (ref 11.5–15.5)
WBC: 6.8 10*3/uL (ref 4.0–10.5)
nRBC: 0 % (ref 0.0–0.2)

## 2019-02-24 LAB — BASIC METABOLIC PANEL
Anion gap: 12 (ref 5–15)
BUN: 8 mg/dL (ref 6–20)
CO2: 22 mmol/L (ref 22–32)
Calcium: 9.2 mg/dL (ref 8.9–10.3)
Chloride: 103 mmol/L (ref 98–111)
Creatinine, Ser: 0.7 mg/dL (ref 0.44–1.00)
GFR calc Af Amer: >60 mL/min (ref >60)
GFR calc non Af Amer: >60 mL/min (ref >60)
Glucose, Bld: 82 mg/dL (ref 70–99)
Potassium: 3.7 mmol/L (ref 3.5–5.1)
Sodium: 137 mmol/L (ref 135–145)

## 2019-02-24 LAB — MAGNESIUM: Magnesium: 1.9 mg/dL (ref 1.7–2.4)

## 2019-02-24 LAB — PHOSPHORUS: Phosphorus: 3.4 mg/dL (ref 2.5–4.6)

## 2019-02-24 MED ORDER — LORAZEPAM 2 MG/ML IJ SOLN
1.0000 mg | Freq: Once | INTRAMUSCULAR | Status: AC
  Administered 2019-02-25: 1 mg via INTRAVENOUS
  Filled 2019-02-24 (×2): qty 1

## 2019-02-24 MED ORDER — ENSURE ENLIVE PO LIQD
237.0000 mL | Freq: Two times a day (BID) | ORAL | Status: DC
  Administered 2019-02-24 – 2019-02-26 (×3): 237 mL via ORAL

## 2019-02-24 MED ORDER — LORAZEPAM 0.5 MG PO TABS
0.5000 mg | ORAL_TABLET | ORAL | Status: DC | PRN
  Administered 2019-02-24: 0.5 mg via ORAL
  Filled 2019-02-24: qty 1

## 2019-02-24 MED ORDER — ADULT MULTIVITAMIN W/MINERALS CH
1.0000 | ORAL_TABLET | Freq: Every day | ORAL | Status: DC
  Administered 2019-02-24 – 2019-02-26 (×3): 1 via ORAL
  Filled 2019-02-24 (×3): qty 1

## 2019-02-24 MED ORDER — MAGNESIUM OXIDE 400 (241.3 MG) MG PO TABS
400.0000 mg | ORAL_TABLET | Freq: Two times a day (BID) | ORAL | Status: DC
  Administered 2019-02-24 – 2019-02-26 (×5): 400 mg via ORAL
  Filled 2019-02-24 (×5): qty 1

## 2019-02-24 MED ORDER — POTASSIUM CHLORIDE CRYS ER 20 MEQ PO TBCR
20.0000 meq | EXTENDED_RELEASE_TABLET | Freq: Every day | ORAL | Status: DC
  Administered 2019-02-24 – 2019-02-26 (×3): 20 meq via ORAL
  Filled 2019-02-24 (×3): qty 1

## 2019-02-24 NOTE — Social Work (Addendum)
Referral made to Wells Guiles, CSW, at [facility]. CSW then called [facility] disposition phone and was updated by Luther Parody, CSW that Maralyn Sago is off. Referral made to [facility], CSW.   Per MD pt is waiting on speech eval for diet advancement.   Octavio Graves, MSW, LCSWA [place] Clinical Social Work

## 2019-02-24 NOTE — Progress Notes (Signed)
CSW received phone call from CSW [place] at [facility].   Patient is needing inpatient psych treatment. CSW provided information to [facility] for review.   CSW will continue to follow.   Drucilla Schmidt, MSW, LCSW-A Clinical Disposition Social Worker [facility]/TTS [phone]

## 2019-02-24 NOTE — Progress Notes (Signed)
  Speech Language Pathology Treatment: Dysphagia  Patient Details Name: [name] MRN: [id number] DOB: 2000/11/23 Today's Date: 02/24/2019 Time: 7628-3151 SLP Time Calculation (min) (ACUTE ONLY): 13 min  Assessment / Plan / Recommendation  Clinical Impression  Pt has significantly improved production of phonation today, although still hoarse. On previous date she had a flat affect; today she is very animated and having difficulty maintaining topic of conersation. Despite Max cues and use of teach back, pt's ability to recall education is limited. Coughing was elicited on ~75% of trials of thin liquids, either immediate or delayed, with associated wet vocal quality as well. No overt dysphagia was noted with purees. Recommend continuing NPO except for a few ice chips at a time after oral care and meds crushed in puree until MBS can be later this afternoon.   HPI HPI:  Molly Callahan is a 19 y.o. female.  Presents to ER after intentional overdose.  EMS reports ingestion around 7:00pm or 7:30pm.  Estimated 35-45 tablets of Lexapro 20 mg and 35 to 45 tablets Wellbutrin XL 300 mg. Patient to be complaining of headache, dizziness and nausea. History of depression, anxiety, past history of suicidal ideation. Pt's voice continued to be very hoarse.       SLP Plan  MBS       Recommendations  Diet recommendations: Other(comment)(few ice chips at a time after oral care) Medication Administration: Crushed with puree                Oral Care Recommendations: Oral care QID Follow up Recommendations: 24 hour supervision/assistance SLP Visit Diagnosis: Dysphagia, oropharyngeal phase (R13.12) Plan: MBS       GO                 Mahala Menghini., M.A. CCC-SLP Acute Rehabilitation Services Pager [phone] Office [phone]  02/24/2019, 11:33 AM

## 2019-02-24 NOTE — Progress Notes (Signed)
Modified Barium Swallow Progress Note  Patient Details  Name: Molly Callahan MRN: [id number] Date of Birth: [birth year]  Today's Date: 02/24/2019  Modified Barium Swallow completed.  Full report located under Chart Review in the Imaging Section.  Brief recommendations include the following:  Clinical Impression  Pt's oropharyngeal swallow is WFL, showing improvements in swallowing and vocal quality since even this morning. This afternoon she is capable of swallowing a variety of consistencies with age-appropriate timeliness and strength; no aspiration or penetration. At times she has small amounts of premature spillage and she masticated a barium tablet despite cues not to, exhibiting behaviors that appeared to be related to mentation more so than true functional abilities. Recommend starting regular solids and thin liquids. SLP will f/u briefly for tolerance given mental status, but do not anticipate any needs post-discharge.   Swallow Evaluation Recommendations       SLP Diet Recommendations: Regular solids;Thin liquid   Liquid Administration via: Cup;Straw   Medication Administration: Whole meds with liquid   Supervision: Patient able to self feed;Intermittent supervision to cue for compensatory strategies   Compensations: Small sips/bites;Slow rate;Minimize environmental distractions   Postural Changes: Seated upright at 90 degrees   Oral Care Recommendations: Oral care BID         Mahala Menghini., M.A. CCC-SLP Acute Rehabilitation Services Pager [phone] Office [phone]  02/24/2019,3:00 PM

## 2019-02-24 NOTE — Progress Notes (Signed)
PROGRESS NOTE    Molly Callahan  VQX:[id number] DOB: [birth year] DOA: 02/17/2019 PCP: Ronnald Nian, DO    Brief Narrative:  19 year old with history of anxiety, depression, previous suicidal ideation brought to the hospital by EMS, called by family as patient was complaining of headache, dizziness and nausea.  Patient admitted ingesting 35-45 tablets of Lexapro 20 mg and about similar number of Wellbutrin XL 300 mg. 1/27: Posturing and seizure-like activity in the ER, intubated, Poison control notified and activated charcoal given. 1/28: QTC prolongation, aggressive electrolyte replacement, treated for seizure. 1/29: LTM with no seizures, blood pressures improved. 1/30: VT arrest, defibrillated x1, time to ROSC less than 3 minutes.  Thought secondary to QTC prolongation. 2/1: Self extubated, hoarse voice risk of aspiration but maintaining airway. 2/2: Transfer to medical floor.   Assessment & Plan:   Principal Problem:   MDD (major depressive disorder), recurrent severe, without psychosis ([place]) Active Problems:   Overdose   Acute respiratory failure with hypoxemia (HCC)   Aspiration pneumonia (HCC)   Prolonged QT interval   Seizure ([place])   Encounter for central line placement   Malnutrition of moderate degree   Serotonin syndrome   Encephalopathy   Suicide attempt ([place])  Intentional drug overdose/suicidal attempt complicated by serotonin syndrome and myoclonus with history of anxiety and depression: Clinically improving.  All SSRI/SNRI discontinued. We will start patient on as needed Ativan to help with anxiety. QTC, 450.  Corrected. We will continue potassium and magnesium supplement to keep potassium more than 4, magnesium more than 2. Continue one-to-one monitoring All-time seizure and suicide precautions. Seen by psychiatry, transferred to inpatient psych once able to eat.  Acute respiratory failure with hypoxemia: Extubated to room air.  Treated for aspiration  pneumonia, finished 7 days of antibiotics.  Start patient on chest physiotherapy, incentive spirometry and flutter valve therapy.  Prolonged QT interval: Due to SSRI overdose.  Corrected.  Serotonin syndrome: Improved.   DVT prophylaxis: SCDs Code Status: Full code Family Communication: None Disposition Plan: patient is from home. Anticipated DC to inpatient psych, Barriers to discharge still not eating, undergoing modified barium swallow today.   Consultants:   Psychiatry  PCCM  Poison control  Procedures:   None  Antimicrobials:  Anti-infectives (From admission, onward)   Start     Dose/Rate Route Frequency Ordered Stop   02/22/19 1100  amoxicillin-clavulanate (AUGMENTIN) 875-125 MG per tablet 1 tablet     1 tablet Per Tube Every 12 hours 02/22/19 1047 02/24/19 2159   02/22/19 1045  amoxicillin-clavulanate (AUGMENTIN) 875-125 MG per tablet 1 tablet  Status:  Discontinued     1 tablet Oral Every 12 hours 02/22/19 1038 02/22/19 1047   02/18/19 1000  ampicillin-sulbactam (UNASYN) 1.5 g in sodium chloride 0.9 % 100 mL IVPB  Status:  Discontinued     1.5 g 200 mL/hr over 30 Minutes Intravenous Every 6 hours 02/18/19 0929 02/22/19 1038         Subjective: Patient seen and examined.  No overnight events.  Patient herself denies any complaints.  Objective: Vitals:   02/23/19 1301 02/23/19 1600 02/23/19 2149 02/24/19 0546  BP: 119/75  126/90 124/75  Pulse: 78 79 79 74  Resp: '18 20 16 18  '$ Temp: 99.3 F (37.4 C) 97.8 F (36.6 C) 98.3 F (36.8 C) 98.6 F (37 C)  TempSrc: Oral Oral Oral Oral  SpO2: 100% 98% 97% 100%  Weight:      Height:        Intake/Output  Summary (Last 24 hours) at 02/24/2019 1335 Last data filed at 02/24/2019 0639 Gross per 24 hour  Intake 884.77 ml  Output --  Net 884.77 ml   Filed Weights   02/19/19 0500 02/21/19 0404 02/22/19 0456  Weight: 50 kg 50.5 kg 51.1 kg    Examination:  General exam: Appears calm, on room air.  She has a raspy  voice that is clearing up. Respiratory system: Clear to auscultation.  Some conducted airway sounds. Cardiovascular system: S1 & S2 heard, RRR. No JVD, murmurs, rubs, gallops or clicks.  Gastrointestinal system: Abdomen is nondistended, soft and nontender.  Central nervous system: Alert and oriented. No focal neurological deficits. Extremities: Symmetric 5 x 5 power. Skin: No rashes, lesions or ulcers Psychiatry: Mood & affect appropriate.  Denies any hallucinations or delusions.    Data Reviewed: I have personally reviewed following labs and imaging studies  CBC: Recent Labs  Lab 02/20/19 0406 02/21/19 0404 02/22/19 0511 02/23/19 0544 02/24/19 1133  WBC 7.5 5.9 4.5 8.2 6.8  NEUTROABS 6.0 4.4 3.1 6.9 4.8  HGB 8.6* 8.8* 9.4* 10.7* 12.5  HCT 26.4* 26.9* 29.2* 32.9* 38.0  MCV 89.8 89.1 89.6 88.7 86.4  PLT 121* 124* 138* 175 627   Basic Metabolic Panel: Recent Labs  Lab 02/19/19 2240 02/20/19 0406 02/20/19 1215 02/21/19 0404 02/22/19 0511 02/23/19 0544 02/24/19 1133  NA 140  --   --  139 137 135 137  K 3.8  --   --  3.7 3.9 3.7 3.7  CL 111  --   --  109 108 100 103  CO2 23  --   --  22 20* 24 22  GLUCOSE 127*  --   --  94 102* 90 82  BUN 13  --   --  '10 11 13 8  '$ CREATININE 0.86  --   --  0.54 0.33* 0.61 0.70  CALCIUM 8.2*  --   --  8.6* 8.6* 8.8* 9.2  MG 1.9   < > 2.0 1.6* 1.6* 2.1 1.9  PHOS  --    < > 2.0* 3.6 4.6 4.3 3.4   < > = values in this interval not displayed.   GFR: Estimated Creatinine Clearance: 90.5 mL/min (by C-G formula based on SCr of 0.7 mg/dL). Liver Function Tests: Recent Labs  Lab 02/17/19 2113 02/18/19 0637 02/20/19 0406  AST 17 19 37  ALT 14 10 56*  ALKPHOS 47 43 52  BILITOT 1.3* 1.1 0.7  PROT 6.9 5.9* 4.5*  ALBUMIN 4.1 3.5 2.3*   No results for input(s): LIPASE, AMYLASE in the last 168 hours. Recent Labs  Lab 02/18/19 0941  AMMONIA 56*   Coagulation Profile: Recent Labs  Lab 02/19/19 0832 02/20/19 0406  INR 1.6* 1.4*    Cardiac Enzymes: No results for input(s): CKTOTAL, CKMB, CKMBINDEX, TROPONINI in the last 168 hours. BNP (last 3 results) No results for input(s): PROBNP in the last 8760 hours. HbA1C: No results for input(s): HGBA1C in the last 72 hours. CBG: Recent Labs  Lab 02/22/19 0825 02/22/19 1200 02/22/19 2008 02/22/19 2342 02/23/19 0401  GLUCAP 99 84 80 81 77   Lipid Profile: No results for input(s): CHOL, HDL, LDLCALC, TRIG, CHOLHDL, LDLDIRECT in the last 72 hours. Thyroid Function Tests: No results for input(s): TSH, T4TOTAL, FREET4, T3FREE, THYROIDAB in the last 72 hours. Anemia Panel: No results for input(s): VITAMINB12, FOLATE, FERRITIN, TIBC, IRON, RETICCTPCT in the last 72 hours. Sepsis Labs: Recent Labs  Lab 02/18/19 0941 02/18/19 1032  02/19/19 0832 02/20/19 0000 02/20/19 0406 02/21/19 0404  PROCALCITON  --   --  1.35  --  0.94 0.52  LATICACIDVEN 3.1* 3.6*  --  1.7  --   --     Recent Results (from the past 240 hour(s))  Respiratory Panel by RT PCR (Flu A&B, Covid) - Nasopharyngeal Swab     Status: None   Collection Time: 02/17/19  9:13 PM   Specimen: Nasopharyngeal Swab  Result Value Ref Range Status   SARS Coronavirus 2 by RT PCR NEGATIVE NEGATIVE Final    Comment: (NOTE) SARS-CoV-2 target nucleic acids are NOT DETECTED. The SARS-CoV-2 RNA is generally detectable in upper respiratoy specimens during the acute phase of infection. The lowest concentration of SARS-CoV-2 viral copies this assay can detect is 131 copies/mL. A negative result does not preclude SARS-Cov-2 infection and should not be used as the sole basis for treatment or other patient management decisions. A negative result may occur with  improper specimen collection/handling, submission of specimen other than nasopharyngeal swab, presence of viral mutation(s) within the areas targeted by this assay, and inadequate number of viral copies (<131 copies/mL). A negative result must be combined with  clinical observations, patient history, and epidemiological information. The expected result is Negative. Fact Sheet for Patients:  PinkCheek.be Fact Sheet for Healthcare Providers:  GravelBags.it This test is not yet ap proved or cleared by the [place] FDA and  has been authorized for detection and/or diagnosis of SARS-CoV-2 by FDA under an Emergency Use Authorization (EUA). This EUA will remain  in effect (meaning this test can be used) for the duration of the COVID-19 declaration under Section 564(b)(1) of the Act, 21 U.S.C. section 360bbb-3(b)(1), unless the authorization is terminated or revoked sooner.    Influenza A by PCR NEGATIVE NEGATIVE Final   Influenza B by PCR NEGATIVE NEGATIVE Final    Comment: (NOTE) The Xpert Xpress SARS-CoV-2/FLU/RSV assay is intended as an aid in  the diagnosis of influenza from Nasopharyngeal swab specimens and  should not be used as a sole basis for treatment. Nasal washings and  aspirates are unacceptable for Xpert Xpress SARS-CoV-2/FLU/RSV  testing. Fact Sheet for Patients: PinkCheek.be Fact Sheet for Healthcare Providers: GravelBags.it This test is not yet approved or cleared by the [place] FDA and  has been authorized for detection and/or diagnosis of SARS-CoV-2 by  FDA under an Emergency Use Authorization (EUA). This EUA will remain  in effect (meaning this test can be used) for the duration of the  Covid-19 declaration under Section 564(b)(1) of the Act, 21  U.S.C. section 360bbb-3(b)(1), unless the authorization is  terminated or revoked. Performed at [facility], [place] [address] [place], [place] [address]   MRSA PCR Screening     Status: None   Collection Time: 02/18/19  2:18 AM   Specimen: Nasopharyngeal  Result Value Ref Range Status   MRSA by PCR NEGATIVE NEGATIVE Final    Comment:         The GeneXpert MRSA Assay (FDA approved for NASAL specimens only), is one component of a comprehensive MRSA colonization surveillance program. It is not intended to diagnose MRSA infection nor to guide or monitor treatment for MRSA infections. Performed at [facility], [place] [address] [place], [place] [address]   Culture, respiratory (non-expectorated)     Status: None   Collection Time: 02/18/19 10:30 AM   Specimen: Tracheal Aspirate; Respiratory  Result Value Ref Range Status   Specimen Description  Final    TRACHEAL ASPIRATE Performed at [facility] [address] [place], [place] [address]    Special Requests   Final    NONE Performed at [facility], [place] [address], [place], [place] [address]    Gram Stain NO WBC SEEN NO ORGANISMS SEEN   Final   Culture   Final    RARE Consistent with normal respiratory flora. Performed at [facility], [place] [address], [place], [place] [address]    Report Status 02/21/2019 FINAL  Final         Radiology Studies: MR BRAIN W WO CONTRAST  Result Date: 02/23/2019 CLINICAL DATA:  Drug overdose. Headache, dizziness and nausea. Seizure. EXAM: MRI HEAD WITHOUT AND WITH CONTRAST MRV HEAD WITHOUT CONTRAST TECHNIQUE: Multiplanar, multiecho pulse sequences of the brain and surrounding structures were obtained without and with intravenous contrast. Angiographic images of the intracranial venous structures were obtained using MRV technique without intravenous contrast. CONTRAST:  57m GADAVIST GADOBUTROL 1 MMOL/ML IV SOLN COMPARISON:  None. FINDINGS: MRI brain findings BRAIN: No acute infarct, acute hemorrhage or extra-axial collection. Normal white matter signal for age. Normal volume of brain parenchyma and CSF spaces. Midline structures are normal. There is no abnormal contrast enhancement. VASCULAR: Major flow voids are preserved. Susceptibility-sensitive sequences show no  chronic microhemorrhage or superficial siderosis. SKULL AND UPPER CERVICAL SPINE: Normal calvarium and skull base. Visualized upper cervical spine and soft tissues are normal. SINUSES/ORBITS: No fluid levels or advanced mucosal thickening. No mastoid or middle ear effusion. Normal orbits. MRV brain findings Superior sagittal sinus: Normal. Straight sinus: Normal. Inferior sagittal sinus, vein of Galen and internal cerebral veins: Normal. Transverse sinuses: Normal. Sigmoid sinuses: Normal. Visualized jugular veins: Normal. IMPRESSION: Normal MRI and MRV of the brain. Electronically Signed   By: KUlyses JarredM.D.   On: 02/23/2019 03:17   DG Chest Port 1 View  Result Date: 02/23/2019 CLINICAL DATA:  History of aspiration pneumonia. Extubation. EXAM: PORTABLE CHEST 1 VIEW COMPARISON:  02/22/2019 FINDINGS: The endotracheal tube and NG tubes have been removed. The left subclavian central venous catheter is stable. Persistent peribronchial thickening and increased interstitial markings in the right lung without focal airspace consolidation. The left lung base demonstrates improved aeration. No pleural effusions or pneumothorax. IMPRESSION: 1. Interval extubation and removal of NG tube. 2. Improved aeration at the left lung base. 3. Persistent largely interstitial process in the right lung. Electronically Signed   By: PMarijo SanesM.D.   On: 02/23/2019 09:01   MR MRV HEAD WO CM  Result Date: 02/23/2019 CLINICAL DATA:  Drug overdose. Headache, dizziness and nausea. Seizure. EXAM: MRI HEAD WITHOUT AND WITH CONTRAST MRV HEAD WITHOUT CONTRAST TECHNIQUE: Multiplanar, multiecho pulse sequences of the brain and surrounding structures were obtained without and with intravenous contrast. Angiographic images of the intracranial venous structures were obtained using MRV technique without intravenous contrast. CONTRAST:  538mGADAVIST GADOBUTROL 1 MMOL/ML IV SOLN COMPARISON:  None. FINDINGS: MRI brain findings BRAIN: No acute  infarct, acute hemorrhage or extra-axial collection. Normal white matter signal for age. Normal volume of brain parenchyma and CSF spaces. Midline structures are normal. There is no abnormal contrast enhancement. VASCULAR: Major flow voids are preserved. Susceptibility-sensitive sequences show no chronic microhemorrhage or superficial siderosis. SKULL AND UPPER CERVICAL SPINE: Normal calvarium and skull base. Visualized upper cervical spine and soft tissues are normal. SINUSES/ORBITS: No fluid levels or advanced mucosal thickening. No mastoid or middle ear effusion. Normal orbits. MRV brain  findings Superior sagittal sinus: Normal. Straight sinus: Normal. Inferior sagittal sinus, vein of Galen and internal cerebral veins: Normal. Transverse sinuses: Normal. Sigmoid sinuses: Normal. Visualized jugular veins: Normal. IMPRESSION: Normal MRI and MRV of the brain. Electronically Signed   By: Ulyses Jarred M.D.   On: 02/23/2019 03:17        Scheduled Meds: . amoxicillin-clavulanate  1 tablet Per Tube Q12H  . chlorhexidine gluconate (MEDLINE KIT)  15 mL Mouth Rinse BID  . Chlorhexidine Gluconate Cloth  6 each Topical Daily  . magnesium oxide  400 mg Oral BID  . mouth rinse  15 mL Mouth Rinse BID  . potassium chloride  20 mEq Oral Daily  . sennosides  15 mL Per Tube BID   Continuous Infusions: . lactated ringers 50 mL/hr at 02/24/19 0639     LOS: 7 days    Time spent: 35 minutes    Barb Merino, MD [facility] Pager [phone]

## 2019-02-24 NOTE — Progress Notes (Signed)
Nutrition Follow-up  DOCUMENTATION CODES:   Non-severe (moderate) malnutrition in context of chronic illness, Underweight  INTERVENTION:   -Ensure Enlive po BID, each supplement provides 350 kcal and 20 grams of protein -Magic cup TID with meals, each supplement provides 290 kcal and 9 grams of protein -MVI with minerals daily  NUTRITION DIAGNOSIS:   Moderate Malnutrition related to chronic illness(depression) as evidenced by moderate fat depletion, moderate muscle depletion.  Ongoing  GOAL:   Patient will meet greater than or equal to 90% of their needs  Progressing   MONITOR:   PO intake, Supplement acceptance, Labs, Weight trends, Skin, I & O's  REASON FOR ASSESSMENT:   Consult, Ventilator Enteral/tube feeding initiation and management  ASSESSMENT:   19 y.o. female with PMH of anxiety, depression, and suicidal ideation. She presented to the ED after intentional OD (35-45 pills of 20 mg Lexapro and 35-45 pills of 300 mg Wellbutrin XL). Pt intubated then had v tach arrest x/p CPR.  2/1- self-extubated 2/2- s/p BSE- remain NPO 2/3- sp MBSS advanced to regular diet with thin liquids  Reviewed I/O's: +1.1 L x 24 hours and +3.8 L since admission  Pt just advanced to regular diet after MBSS.   Due to malnutrition, pt would greatly benefit from addition of oral nutrition supplements.   Medications reviewed and include lactated ringers infusion @ 50 ml/hr.   Per CSW notes, plan for inpatient psychiatric hospitalization; possible [facility].   Labs reviewed.   Diet Order:   Diet Order            Diet regular Room service appropriate? Yes; Fluid consistency: Thin  Diet effective now              EDUCATION NEEDS:   No education needs have been identified at this time  Skin:  Skin Assessment: Reviewed RN Assessment  Last BM:  02/22/19  Height:   Ht Readings from Last 1 Encounters:  02/17/19 5' 6.5" (1.689 m)    Weight:   Wt Readings from Last 1 Encounters:   02/22/19 51.1 kg    Ideal Body Weight:  60.2 kg  BMI:  Body mass index is 17.91 kg/m.  Estimated Nutritional Needs:   Kcal:  1800-1200  Protein:  100-115 grams  Fluid:  > 1.8 L    Milik Gilreath A. Mayford Knife, RD, LDN, CDCES Registered Dietitian II Certified Diabetes Care and Education Specialist Pager: [phone] After hours Pager: [phone]

## 2019-02-24 NOTE — Progress Notes (Signed)
Occupational Therapy Treatment Patient Details Name: Molly Callahan MRN: [id number] DOB: 2000-09-21 Today's Date: 02/24/2019    History of present illness pt is a 19 y/o female admitted after OD on lexapro and Wellbutrin.  Posturing in the ED with seizures, intubated for airway protection.  Extubated 2/1.  PMHx: suicidal ideation, depression, anxiety d/o.   OT comments  Pt seen for higher level cognition. Pt with no difficulties with ADL if motivated to perform them. Pt denying need for grooming. Pt appears to be at functional baseline. Pt answering inappropriately to questions asked this session for higher level cognition.  Pt requiring frequent redirection to answer questions from OTR. Pt unable stay on topic and h/o poor recall. Pt would benefit from continued OT services in behavioral health setting. Goals met. No further acute care services required. OT treatment and d/c today. OT signing off.     Follow Up Recommendations  Other (comment)(Behavioral health OT)    Equipment Recommendations  None recommended by OT    Recommendations for Other Services      Precautions / Restrictions Precautions Precautions: Other (comment)(suicide) Restrictions Weight Bearing Restrictions: No       Mobility Bed Mobility Overal bed mobility: Modified Independent                Transfers Overall transfer level: Modified independent                    Balance Overall balance assessment: Mild deficits observed, not formally tested                                         ADL either performed or assessed with clinical judgement   ADL Overall ADL's : Modified independent                                     Functional mobility during ADLs: Supervision/safety General ADL Comments: Pt denying need for grooming. Pt appears to be at functional baseline. Pt answering inappropriately to questions asked this session for higher level cognition. OTR to  d/c pt today due to pt's deficits require behavioral health approach.     Vision       Perception     Praxis      Cognition Arousal/Alertness: Awake/alert Behavior During Therapy: (Inappropriate at times) Overall Cognitive Status: No family/caregiver present to determine baseline cognitive functioning                                 General Comments: Pt answering inappropriately often throughout session. When asked which therapist was just in the room, pt answered "I'm going for a lesbian vibe."  Pt unable to recall all information from previous session when reviewed with SLP prior to Russell arrival. This requires behavioral health approach.        Exercises     Shoulder Instructions       General Comments      Pertinent Vitals/ Pain       Pain Assessment: No/denies pain  Home Living  Prior Functioning/Environment              Frequency           Progress Toward Goals  OT Goals(current goals can now be found in the care plan section)  Progress towards OT goals: Progressing toward goals  Acute Rehab OT Goals Patient Stated Goal: pt did not relate goals, but agrees she needs to gain strength and independence OT Goal Formulation: With patient Time For Goal Achievement: 03/09/19 Potential to Achieve Goals: Good ADL Goals Pt Will Perform Lower Body Dressing: with modified independence;sit to/from stand Pt Will Perform Toileting - Clothing Manipulation and hygiene: with modified independence;sit to/from stand Additional ADL Goal #1: Pt will improve to modified independent with OOB ADL tasks with good safety awareness Additional ADL Goal #2: Pt will increase GMC/FMC with BUE HEP provided.  Plan Discharge plan remains appropriate;All goals met and education completed, patient discharged from OT services    Co-evaluation                 AM-PAC OT "6 Clicks" Daily Activity      Outcome Measure   Help from another person eating meals?: None Help from another person taking care of personal grooming?: None Help from another person toileting, which includes using toliet, bedpan, or urinal?: None Help from another person bathing (including washing, rinsing, drying)?: None Help from another person to put on and taking off regular upper body clothing?: None Help from another person to put on and taking off regular lower body clothing?: None 6 Click Score: 24    End of Session    OT Visit Diagnosis: Unsteadiness on feet (R26.81);Other symptoms and signs involving cognitive function   Activity Tolerance Patient tolerated treatment well   Patient Left in bed;with nursing/sitter in room   Nurse Communication Mobility status        Time: 7989-2119 OT Time Calculation (min): 32 min  Charges: OT General Charges $OT Visit: 1 Visit OT Treatments $Therapeutic Activity: 8-22 mins $Cognitive Funtion inital: Initial 15 mins  Jefferey Pica OTR/L Acute Rehabilitation Services Pager: [phone] Office: [phone]    Kyreese Chio C 02/24/2019, 2:38 PM

## 2019-02-25 MED ORDER — WHITE PETROLATUM EX OINT
TOPICAL_OINTMENT | CUTANEOUS | Status: AC
  Administered 2019-02-25: 0.2
  Filled 2019-02-25: qty 28.35

## 2019-02-25 NOTE — Progress Notes (Signed)
1930 Pt was very agitated; talking to gibberish and hallucinating seeing multiple people in the room. Pt able to answer questions somewhat appropriate but will suddenly talked to the wall occasionally. VSS.   2345: Pt become more agitated and started cussing to the staff. Made NP on call aware. Ativan 1mg  x1 ordered. Will continue to monitor pt.

## 2019-02-25 NOTE — TOC Progression Note (Addendum)
Transition of Care [facility]) - Progression Note    Patient Details  Name: Molly Callahan MRN: [id number] Date of Birth: 03/28/2000  [facility] CM/SW Contact  Doy Hutching, [place] Phone Number: 02/25/2019, 11:30 AM  Clinical Narrative:    3:39pm- CSW spoke with Jerilynn Som at [facility], staff is concerned with pt lethargy. MD and bedside RN aware.   11:30am- CSW received message from [facility] that pt will need to be referred to other [facility] facilities for review.  Referral was made at 12:22pm to [facility] Medicine Unit.    Expected Discharge Plan: Psychiatric Hospital Barriers to Discharge: Psych Bed not available  Expected Discharge Plan and Services Expected Discharge Plan: Psychiatric Hospital In-house Referral: Clinical Social Work Discharge Planning Services: CM Consult Post Acute Care Choice: NA Living arrangements for the past 2 months: Single Family Home   Readmission Risk Interventions Readmission Risk Prevention Plan 02/25/2019  Post Dischage Appt Not Complete  Appt Comments plan for transfer to [facility]  Medication Screening Complete  Transportation Screening Complete  Some recent data might be hidden

## 2019-02-25 NOTE — Progress Notes (Signed)
PROGRESS NOTE    Molly Callahan  ZOX:[id number] DOB: 01-19-2001 DOA: 02/17/2019 PCP: Ronnald Nian, DO    Brief Narrative:  19 year old with history of anxiety, depression, previous suicidal ideation brought to the hospital by EMS, called by family as patient was complaining of headache, dizziness and nausea.  Patient admitted ingesting 35-45 tablets of Lexapro 20 mg and about similar number of Wellbutrin XL 300 mg. 1/27: Posturing and seizure-like activity in the ER, intubated, Poison control notified and activated charcoal given. 1/28: QTC prolongation, aggressive electrolyte replacement, treated for seizure. 1/29: LTM with no seizures, blood pressures improved. 1/30: VT arrest, defibrillated x1, time to ROSC less than 3 minutes.  Thought secondary to QTC prolongation. 2/1: Self extubated, hoarse voice risk of aspiration but maintaining airway. 2/2: Transferred to medical floor. 2/4: Patient remains medically clear, she has some agitation and hallucinations and was given some Ativan overnight.   Assessment & Plan:   Principal Problem:   MDD (major depressive disorder), recurrent severe, without psychosis ([place]) Active Problems:   Overdose   Acute respiratory failure with hypoxemia (HCC)   Aspiration pneumonia (HCC)   Prolonged QT interval   Seizure ([place])   Encounter for central line placement   Malnutrition of moderate degree   Serotonin syndrome   Encephalopathy   Suicide attempt ([place])  Intentional drug overdose/suicidal attempt complicated by serotonin syndrome and myoclonus with history of anxiety and depression: Clinically improving.  All SSRI/SNRI discontinued. Patient with recurrent agitation and hallucinations.  Using Ativan as needed.   QTC, 450.  Corrected. We will continue potassium and magnesium supplement. Recheck level in 1 week.  Continue one-to-one monitoring All-time seizure and suicide precautions. Medically stable to transfer to inpatient psych when  bed is available.  Acute respiratory failure with hypoxemia: Extubated to room air.  Treated for aspiration pneumonia, finished 7 days of antibiotics.  Continue with hest physiotherapy, incentive spirometry and flutter valve therapy.  Prolonged QT interval: Due to SSRI overdose.  Corrected.  Serotonin syndrome: Improved.  Patient will need inpatient psychiatric treatment, medication adjustment and titration. Active medical issues have resolved, transfer to inpatient psych when bed available.   DVT prophylaxis: SCDs Code Status: Full code Family Communication: None, patient's father 02/23/18/2021. Disposition Plan: patient is from home. Anticipated DC to inpatient psych, Barriers to discharge, medically stable.  Pending inpatient psych bed availability.  Consultants:   Psychiatry  PCCM  Poison control  Procedures:   None  Antimicrobials:  Anti-infectives (From admission, onward)   Start     Dose/Rate Route Frequency Ordered Stop   02/22/19 1100  amoxicillin-clavulanate (AUGMENTIN) 875-125 MG per tablet 1 tablet     1 tablet Per Tube Every 12 hours 02/22/19 1047 02/24/19 2159   02/22/19 1045  amoxicillin-clavulanate (AUGMENTIN) 875-125 MG per tablet 1 tablet  Status:  Discontinued     1 tablet Oral Every 12 hours 02/22/19 1038 02/22/19 1047   02/18/19 1000  ampicillin-sulbactam (UNASYN) 1.5 g in sodium chloride 0.9 % 100 mL IVPB  Status:  Discontinued     1.5 g 200 mL/hr over 30 Minutes Intravenous Every 6 hours 02/18/19 0929 02/22/19 1038         Subjective: Patient seen and examined.  She was deeply asleep after receiving 1 mg of Ativan early morning hours.  Nursing reported that she was having hallucinations, overnight agitations and anger.   Objective: Vitals:   02/23/19 2149 02/24/19 0546 02/24/19 1931 02/25/19 0700  BP: 126/90 124/75 137/63 (!) 113/58  Pulse:  79 74 95 92  Resp: '16 18 16 18  '$ Temp: 98.3 F (36.8 C) 98.6 F (37 C) 98.4 F (36.9 C) 98.4 F (36.9  C)  TempSrc: Oral Oral Oral   SpO2: 97% 100% 100% 99%  Weight:      Height:        Intake/Output Summary (Last 24 hours) at 02/25/2019 1035 Last data filed at 02/24/2019 1642 Gross per 24 hour  Intake 220 ml  Output --  Net 220 ml   Filed Weights   02/19/19 0500 02/21/19 0404 02/22/19 0456  Weight: 50 kg 50.5 kg 51.1 kg    Examination: Physical Exam  Constitutional:  Looks comfortable.  Currently sleeping after restless night.  HENT:  Head: Normocephalic.  Eyes: Pupils are equal, round, and reactive to light.  Pulmonary/Chest: Breath sounds normal.  Musculoskeletal:     Cervical back: Neck supple.  Psychiatric:  Sleepy.  Flat affect.  Reported hallucination when she was awake.      Data Reviewed: I have personally reviewed following labs and imaging studies  CBC: Recent Labs  Lab 02/20/19 0406 02/21/19 0404 02/22/19 0511 02/23/19 0544 02/24/19 1133  WBC 7.5 5.9 4.5 8.2 6.8  NEUTROABS 6.0 4.4 3.1 6.9 4.8  HGB 8.6* 8.8* 9.4* 10.7* 12.5  HCT 26.4* 26.9* 29.2* 32.9* 38.0  MCV 89.8 89.1 89.6 88.7 86.4  PLT 121* 124* 138* 175 884   Basic Metabolic Panel: Recent Labs  Lab 02/19/19 2240 02/20/19 0406 02/20/19 1215 02/21/19 0404 02/22/19 0511 02/23/19 0544 02/24/19 1133  NA 140  --   --  139 137 135 137  K 3.8  --   --  3.7 3.9 3.7 3.7  CL 111  --   --  109 108 100 103  CO2 23  --   --  22 20* 24 22  GLUCOSE 127*  --   --  94 102* 90 82  BUN 13  --   --  '10 11 13 8  '$ CREATININE 0.86  --   --  0.54 0.33* 0.61 0.70  CALCIUM 8.2*  --   --  8.6* 8.6* 8.8* 9.2  MG 1.9   < > 2.0 1.6* 1.6* 2.1 1.9  PHOS  --    < > 2.0* 3.6 4.6 4.3 3.4   < > = values in this interval not displayed.   GFR: Estimated Creatinine Clearance: 90.5 mL/min (by C-G formula based on SCr of 0.7 mg/dL). Liver Function Tests: Recent Labs  Lab 02/20/19 0406  AST 37  ALT 56*  ALKPHOS 52  BILITOT 0.7  PROT 4.5*  ALBUMIN 2.3*   No results for input(s): LIPASE, AMYLASE in the last 168  hours. No results for input(s): AMMONIA in the last 168 hours. Coagulation Profile: Recent Labs  Lab 02/19/19 0832 02/20/19 0406  INR 1.6* 1.4*   Cardiac Enzymes: No results for input(s): CKTOTAL, CKMB, CKMBINDEX, TROPONINI in the last 168 hours. BNP (last 3 results) No results for input(s): PROBNP in the last 8760 hours. HbA1C: No results for input(s): HGBA1C in the last 72 hours. CBG: Recent Labs  Lab 02/22/19 0825 02/22/19 1200 02/22/19 2008 02/22/19 2342 02/23/19 0401  GLUCAP 99 84 80 81 77   Lipid Profile: No results for input(s): CHOL, HDL, LDLCALC, TRIG, CHOLHDL, LDLDIRECT in the last 72 hours. Thyroid Function Tests: No results for input(s): TSH, T4TOTAL, FREET4, T3FREE, THYROIDAB in the last 72 hours. Anemia Panel: No results for input(s): VITAMINB12, FOLATE, FERRITIN, TIBC, IRON, RETICCTPCT in the  last 72 hours. Sepsis Labs: Recent Labs  Lab 02/19/19 0832 02/20/19 0000 02/20/19 0406 02/21/19 0404  PROCALCITON 1.35  --  0.94 0.52  LATICACIDVEN  --  1.7  --   --     Recent Results (from the past 240 hour(s))  Respiratory Panel by RT PCR (Flu A&B, Covid) - Nasopharyngeal Swab     Status: None   Collection Time: 02/17/19  9:13 PM   Specimen: Nasopharyngeal Swab  Result Value Ref Range Status   SARS Coronavirus 2 by RT PCR NEGATIVE NEGATIVE Final    Comment: (NOTE) SARS-CoV-2 target nucleic acids are NOT DETECTED. The SARS-CoV-2 RNA is generally detectable in upper respiratoy specimens during the acute phase of infection. The lowest concentration of SARS-CoV-2 viral copies this assay can detect is 131 copies/mL. A negative result does not preclude SARS-Cov-2 infection and should not be used as the sole basis for treatment or other patient management decisions. A negative result may occur with  improper specimen collection/handling, submission of specimen other than nasopharyngeal swab, presence of viral mutation(s) within the areas targeted by this  assay, and inadequate number of viral copies (<131 copies/mL). A negative result must be combined with clinical observations, patient history, and epidemiological information. The expected result is Negative. Fact Sheet for Patients:  PinkCheek.be Fact Sheet for Healthcare Providers:  GravelBags.it This test is not yet ap proved or cleared by the [place] FDA and  has been authorized for detection and/or diagnosis of SARS-CoV-2 by FDA under an Emergency Use Authorization (EUA). This EUA will remain  in effect (meaning this test can be used) for the duration of the COVID-19 declaration under Section 564(b)(1) of the Act, 21 U.S.C. section 360bbb-3(b)(1), unless the authorization is terminated or revoked sooner.    Influenza A by PCR NEGATIVE NEGATIVE Final   Influenza B by PCR NEGATIVE NEGATIVE Final    Comment: (NOTE) The Xpert Xpress SARS-CoV-2/FLU/RSV assay is intended as an aid in  the diagnosis of influenza from Nasopharyngeal swab specimens and  should not be used as a sole basis for treatment. Nasal washings and  aspirates are unacceptable for Xpert Xpress SARS-CoV-2/FLU/RSV  testing. Fact Sheet for Patients: PinkCheek.be Fact Sheet for Healthcare Providers: GravelBags.it This test is not yet approved or cleared by the [place] FDA and  has been authorized for detection and/or diagnosis of SARS-CoV-2 by  FDA under an Emergency Use Authorization (EUA). This EUA will remain  in effect (meaning this test can be used) for the duration of the  Covid-19 declaration under Section 564(b)(1) of the Act, 21  U.S.C. section 360bbb-3(b)(1), unless the authorization is  terminated or revoked. Performed at [facility], [place] [address], [place], [place] [address]   MRSA PCR Screening     Status: None   Collection Time: 02/18/19  2:18 AM    Specimen: Nasopharyngeal  Result Value Ref Range Status   MRSA by PCR NEGATIVE NEGATIVE Final    Comment:        The GeneXpert MRSA Assay (FDA approved for NASAL specimens only), is one component of a comprehensive MRSA colonization surveillance program. It is not intended to diagnose MRSA infection nor to guide or monitor treatment for MRSA infections. Performed at [facility], [place] [address], [place] [address]   Culture, respiratory (non-expectorated)     Status: None   Collection Time: 02/18/19 10:30 AM   Specimen: Tracheal Aspirate; Respiratory  Result Value Ref Range Status   Specimen Description  Final    TRACHEAL ASPIRATE Performed at [place] [address], [place], [place] [address]    Special Requests   Final    NONE Performed at [facility], [place] [address], [place], [place] [address]    Gram Stain NO WBC SEEN NO ORGANISMS SEEN   Final   Culture   Final    RARE Consistent with normal respiratory flora. Performed at [facility], [place] [address], [place],  [address]    Report Status 02/21/2019 FINAL  Final         Radiology Studies: DG Swallowing Func-Speech Pathology  Result Date: 02/24/2019 Objective Swallowing Evaluation: Type of Study: MBS-Modified Barium Swallow Study  Patient Details Name: Molly Callahan MRN: [id number] Date of Birth: [birth year] Today's Date: 02/24/2019 Time: SLP Start Time (ACUTE ONLY): 2409 -SLP Stop Time (ACUTE ONLY): 1425 SLP Time Calculation (min) (ACUTE ONLY): 20 min Past Medical History: Past Medical History: Diagnosis Date . Anxiety  . 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 Depression  . Suicidal ideation 03/19/2016 Past Surgical History: Past Surgical History: Procedure Laterality Date . NO PAST SURGERIES   HPI: NYSIA DELL is a 19 y.o. female.  Presents to ER after intentional overdose. Estimated 35-45 tablets of Lexapro 20 mg and 35 to 45  tablets Wellbutrin XL 300 mg. Pt was intubated on 1/27, and self extubated on 2/1. History of depression, anxiety, past history of suicidal ideation.  Subjective: alert, requires cueing Assessment / Plan / Recommendation CHL IP CLINICAL IMPRESSIONS 02/24/2019 Clinical Impression Pt's oropharyngeal swallow is WFL, showing improvements in swallowing and vocal quality since even this morning. This afternoon she is capable of swallowing a variety of consistencies with age-appropriate timeliness and strength; no aspiration or penetration. At times she has small amounts of premature spillage and she masticated a barium tablet despite cues not to, exhibiting behaviors that appeared to be related to mentation more so than true functional abilities. Recommend starting regular solids and thin liquids. SLP will f/u briefly for tolerance given mental status, but do not anticipate any needs post-discharge. SLP Visit Diagnosis Dysphagia, oropharyngeal phase (R13.12) Attention and concentration deficit following -- Frontal lobe and executive function deficit following -- Impact on safety and function Mild aspiration risk   CHL IP TREATMENT RECOMMENDATION 02/24/2019 Treatment Recommendations Therapy as outlined in treatment plan below   Prognosis 02/24/2019 Prognosis for Safe Diet Advancement Good Barriers to Reach Goals Cognitive deficits Barriers/Prognosis Comment -- CHL IP DIET RECOMMENDATION 02/24/2019 SLP Diet Recommendations Regular solids;Thin liquid Liquid Administration via Cup;Straw Medication Administration Whole meds with liquid Compensations Small sips/bites;Slow rate;Minimize environmental distractions Postural Changes Seated upright at 90 degrees   CHL IP OTHER RECOMMENDATIONS 02/24/2019 Recommended Consults -- Oral Care Recommendations Oral care BID Other Recommendations --   CHL IP FOLLOW UP RECOMMENDATIONS 02/24/2019 Follow up Recommendations 24 hour supervision/assistance   CHL IP FREQUENCY AND DURATION 02/24/2019 Speech  Therapy Frequency (ACUTE ONLY) min 1 x/week Treatment Duration 1 week      CHL IP ORAL PHASE 02/24/2019 Oral Phase WFL Oral - Pudding Teaspoon -- Oral - Pudding Cup -- Oral - Honey Teaspoon -- Oral - Honey Cup -- Oral - Nectar Teaspoon -- Oral - Nectar Cup -- Oral - Nectar Straw -- Oral - Thin Teaspoon -- Oral - Thin Cup -- Oral - Thin Straw -- Oral - Puree -- Oral - Mech Soft -- Oral - Regular -- Oral - Multi-Consistency -- Oral - Pill -- Oral Phase -  Comment --  CHL IP PHARYNGEAL PHASE 02/24/2019 Pharyngeal Phase WFL Pharyngeal- Pudding Teaspoon -- Pharyngeal -- Pharyngeal- Pudding Cup -- Pharyngeal -- Pharyngeal- Honey Teaspoon -- Pharyngeal -- Pharyngeal- Honey Cup -- Pharyngeal -- Pharyngeal- Nectar Teaspoon -- Pharyngeal -- Pharyngeal- Nectar Cup -- Pharyngeal -- Pharyngeal- Nectar Straw -- Pharyngeal -- Pharyngeal- Thin Teaspoon -- Pharyngeal -- Pharyngeal- Thin Cup -- Pharyngeal -- Pharyngeal- Thin Straw -- Pharyngeal -- Pharyngeal- Puree -- Pharyngeal -- Pharyngeal- Mechanical Soft -- Pharyngeal -- Pharyngeal- Regular -- Pharyngeal -- Pharyngeal- Multi-consistency -- Pharyngeal -- Pharyngeal- Pill -- Pharyngeal -- Pharyngeal Comment --  CHL IP CERVICAL ESOPHAGEAL PHASE 02/24/2019 Cervical Esophageal Phase WFL Pudding Teaspoon -- Pudding Cup -- Honey Teaspoon -- Honey Cup -- Nectar Teaspoon -- Nectar Cup -- Nectar Straw -- Thin Teaspoon -- Thin Cup -- Thin Straw -- Puree -- Mechanical Soft -- Regular -- Multi-consistency -- Pill -- Cervical Esophageal Comment -- Osie Bond., M.A. [facility] Pager [phone] Office [phone] 02/24/2019, 3:02 PM                   Scheduled Meds: . chlorhexidine gluconate (MEDLINE KIT)  15 mL Mouth Rinse BID  . Chlorhexidine Gluconate Cloth  6 each Topical Daily  . feeding supplement (ENSURE ENLIVE)  237 mL Oral BID BM  . magnesium oxide  400 mg Oral BID  . mouth rinse  15 mL Mouth Rinse BID  . multivitamin with minerals  1 tablet Oral Daily    . potassium chloride  20 mEq Oral Daily  . sennosides  15 mL Per Tube BID   Continuous Infusions: . lactated ringers 50 mL/hr at 02/24/19 0639     LOS: 8 days    Time spent:25 minutes    Barb Merino, MD [facility] Pager [phone]

## 2019-02-25 NOTE — Progress Notes (Signed)
PT Cancellation Note  Patient Details Name: Molly Callahan MRN: [id number] DOB: [birth year]   Cancelled Treatment:    Reason Eval/Treat Not Completed: Fatigue/lethargy limiting ability to participate. Pt sound asleep upon arrival. Safety sitter reporting that pt's mother has requested to let her sleep. PT will continue to f/u with pt acutely as available.    Alessandra Bevels Geofrey Silliman 02/25/2019, 3:24 PM

## 2019-02-25 NOTE — Progress Notes (Signed)
  Speech Language Pathology Treatment: Dysphagia  Patient Details Name: Molly Callahan MRN: [id number] DOB: 02-19-2000 Today's Date: 02/25/2019 Time: 9935-7017 SLP Time Calculation (min) (ACUTE ONLY): 13 min  Assessment / Plan / Recommendation Clinical Impression  Patient received in room for ST treatment. NT and father present at bedside. Patient's voice quality noted to be minimally/mildly hoarse. She presented with occasional dry coughing (seemingly unrelated to PO). Pt seen sitting upright, self-feeding graham crackers with peanut butter and taking straw sips of thin liquid ([place]). Pt with adequate oral acceptance and mastication of PO, swallow initiation appeared timely. No overt s/sx aspiration observed with PO seen today. RN entered to give patient pills, patient swallowed and followed with straw sips of thin liquid, no overt s/sx aspiration seen.  ST provided education to patient and her father re: results of MBSS completed 02/24/19 and recommendations. Both verbalized understanding and stated they had all questions answered. ST educated re: recommendations for regular/thin liquid diet, eating/drinking while sitting upright, small bites/sips.  ST to sign off at this time. Please re-consult ST should new needs arise.    HPI HPI: Molly Callahan is a 19 y.o. female.  Presents to ER after intentional overdose. Estimated 35-45 tablets of Lexapro 20 mg and 35 to 45 tablets Wellbutrin XL 300 mg. Pt was intubated on 1/27, and self extubated on 2/1. History of depression, anxiety, past history of suicidal ideation.      SLP Plan  Discharge SLP treatment due to (comment);All goals met       Recommendations  Diet recommendations: Regular;Thin liquid Medication Administration: Whole meds with liquid Compensations: Small sips/bites;Slow rate Postural Changes and/or Swallow Maneuvers: Seated upright 90 degrees                Oral Care Recommendations: Oral care QID Follow up Recommendations:  24 hour supervision/assistance SLP Visit Diagnosis: Dysphagia, oropharyngeal phase (R13.12) Plan: Discharge SLP treatment due to (comment);All goals met       [place], M.Ed., CCC-SLP Speech Therapy Acute Rehabilitation [phone]: Acute Rehab office [phone] - pager    Peightyn Roberson 02/25/2019, 4:00 PM

## 2019-02-25 NOTE — Progress Notes (Signed)
Ambulated around 510 feet without assist.

## 2019-02-25 NOTE — TOC Initial Note (Signed)
Transition of Care [facility]) - Initial/Assessment Note    Patient Details  Name: Molly Callahan MRN: [id number] Date of Birth: 11-07-2000  Transition of Care [facility]) CM/SW Contact:    Alexander Mt, [place] Phone Number: 02/25/2019, 11:19 AM  Clinical Narrative:                 CSW met with pt at bedside. Introduced self, role, reason for visit. Discussed that attending MD had spoken with this writer and cleared pt for discharge when [facility] bed available. Pt aware that she is recommended for behavioral health support at discharge. She read and signed voluntary form. She gives permission for this writer to speak with her mother regarding discharge. CSW has been in communication with Audree Camel, [place], at [facility] in order for pt to be reviewed for bed availability there.   TOC team awaits update from [facility].    Expected Discharge Plan: Psychiatric Hospital Barriers to Discharge: Psych Bed not available   Patient Goals and CMS Choice Patient states their goals for this hospitalization and ongoing recovery are:: Behavioral Health CMS Medicare.gov Compare Post Acute Care list provided to:: (n/a) Choice offered to / list presented to : NA  Expected Discharge Plan and Services Expected Discharge Plan: Psychiatric Hospital In-house Referral: Clinical Social Work Discharge Planning Services: CM Consult Post Acute Care Choice: NA Living arrangements for the past 2 months: Single Family Home    Prior Living Arrangements/Services Living arrangements for the past 2 months: Single Family Home Lives with:: Relatives Patient language and need for interpreter reviewed:: Yes(no needs) Do you feel safe going back to the place where you live?: Yes      Need for Family Participation in Patient Care: Yes (Comment) Care giver support system in place?: Yes (comment)   Criminal Activity/Legal Involvement Pertinent to Current Situation/Hospitalization: No - Comment as needed  Activities of Daily Living Home  Assistive Devices/Equipment: None ADL Screening (condition at time of admission) Patient's cognitive ability adequate to safely complete daily activities?: Yes Is the patient deaf or have difficulty hearing?: No Does the patient have difficulty seeing, even when wearing glasses/contacts?: No Does the patient have difficulty concentrating, remembering, or making decisions?: No Patient able to express need for assistance with ADLs?: Yes Does the patient have difficulty dressing or bathing?: No Independently performs ADLs?: Yes (appropriate for developmental age) Does the patient have difficulty walking or climbing stairs?: No Weakness of Legs: None Weakness of Arms/Hands: None  Permission Sought/Granted Permission sought to share information with : Family Supports Permission granted to share information with : Yes, Verbal Permission Granted  Share Information with NAME: Molly Callahan  Permission granted to share info w AGENCY: [place] granted to share info w Relationship: mother  Permission granted to share info w Contact Information: [phone]  Emotional Assessment Appearance:: Appears stated age Attitude/Demeanor/Rapport: Lethargic Affect (typically observed): Flat Orientation: : Oriented to Self, Oriented to Place, Oriented to  Time, Oriented to Situation Alcohol / Substance Use: Not Applicable Psych Involvement: Yes (comment)  Admission diagnosis:  Overdose [T50.901A] Seizure ([place]) [R56.9] Encephalopathy [G93.40] Prolonged QT interval [R94.31] Intentional drug overdose, initial encounter [facility]) [T50.902A] Patient Active Problem List   Diagnosis Date Noted  . Suicide attempt ([place]) 02/23/2019  . Encephalopathy   . Malnutrition of moderate degree 02/19/2019  . Serotonin syndrome   . Acute respiratory failure with hypoxemia ([place])   . Aspiration pneumonia ([place])   . Prolonged QT interval   . Seizure ([place])   . Encounter  for central line placement   . Overdose  02/17/2019  . Anxiety   . MDD (major depressive disorder), recurrent severe, without psychosis ([place]) 10/21/2016  . Depression, recurrent ([place]) 03/19/2016  . Suicidal ideation 03/19/2016   PCP:  Ronnald Nian, DO Pharmacy:   [facility] [phone] [address], [address] AT [address] [place] [address] Phone: [phone] Fax: [phone]   Readmission Risk Interventions Readmission Risk Prevention Plan 02/25/2019  Post Dischage Appt Not Complete  Appt Comments [facility]  Medication Screening Complete  Transportation Screening Complete  Some recent data might be hidden

## 2019-02-26 ENCOUNTER — Inpatient Hospital Stay
Admission: RE | Admit: 2019-02-26 | Discharge: 2019-03-02 | DRG: 885 | Disposition: A | Discharge status: Home / Self Care | Payer: BC Managed Care – PPO | Source: Intra-hospital | Attending: Psychiatry | Admitting: Psychiatry

## 2019-02-26 ENCOUNTER — Other Ambulatory Visit: Payer: Self-pay

## 2019-02-26 ENCOUNTER — Encounter: Payer: Self-pay | Admitting: Registered Nurse

## 2019-02-26 DIAGNOSIS — I468 Cardiac arrest due to other underlying condition: Secondary | ICD-10-CM | POA: Diagnosis not present

## 2019-02-26 DIAGNOSIS — F17210 Nicotine dependence, cigarettes, uncomplicated: Secondary | ICD-10-CM | POA: Diagnosis not present

## 2019-02-26 DIAGNOSIS — T43292A Poisoning by other antidepressants, intentional self-harm, initial encounter: Secondary | ICD-10-CM | POA: Diagnosis not present

## 2019-02-26 DIAGNOSIS — T50901A Poisoning by unspecified drugs, medicaments and biological substances, accidental (unintentional), initial encounter: Secondary | ICD-10-CM | POA: Diagnosis present

## 2019-02-26 DIAGNOSIS — F419 Anxiety disorder, unspecified: Secondary | ICD-10-CM | POA: Diagnosis not present

## 2019-02-26 DIAGNOSIS — Z915 Personal history of self-harm: Secondary | ICD-10-CM | POA: Diagnosis not present

## 2019-02-26 DIAGNOSIS — F515 Nightmare disorder: Secondary | ICD-10-CM | POA: Diagnosis not present

## 2019-02-26 DIAGNOSIS — Z79899 Other long term (current) drug therapy: Secondary | ICD-10-CM | POA: Diagnosis not present

## 2019-02-26 DIAGNOSIS — T43222A Poisoning by selective serotonin reuptake inhibitors, intentional self-harm, initial encounter: Secondary | ICD-10-CM | POA: Diagnosis present

## 2019-02-26 DIAGNOSIS — R45851 Suicidal ideations: Secondary | ICD-10-CM

## 2019-02-26 DIAGNOSIS — F332 Major depressive disorder, recurrent severe without psychotic features: Principal | ICD-10-CM | POA: Diagnosis present

## 2019-02-26 MED ORDER — HYDROXYZINE HCL 25 MG PO TABS: 25.0000 mg | ORAL_TABLET | Freq: Three times a day (TID) | ORAL | Status: DC | PRN

## 2019-02-26 MED ORDER — MAGNESIUM HYDROXIDE 400 MG/5ML PO SUSP: 30.0000 mL | Freq: Every day | ORAL | Status: DC | PRN

## 2019-02-26 MED ORDER — ALUM & MAG HYDROXIDE-SIMETH 200-200-20 MG/5ML PO SUSP: 30.0000 mL | ORAL | Status: DC | PRN

## 2019-02-26 MED ORDER — TRAZODONE HCL 50 MG PO TABS
50.0000 mg | ORAL_TABLET | Freq: Every evening | ORAL | Status: DC | PRN
  Administered 2019-02-26: 50 mg via ORAL
  Filled 2019-02-26: qty 1

## 2019-02-26 MED ORDER — ACETAMINOPHEN 325 MG PO TABS: 650.0000 mg | ORAL_TABLET | Freq: Four times a day (QID) | ORAL | Status: DC | PRN

## 2019-02-26 MED ORDER — MAGNESIUM OXIDE 400 (241.3 MG) MG PO TABS: 400.0000 mg | ORAL_TABLET | Freq: Two times a day (BID) | ORAL | Status: DC

## 2019-02-26 MED ORDER — POTASSIUM CHLORIDE CRYS ER 20 MEQ PO TBCR: 20.0000 meq | EXTENDED_RELEASE_TABLET | Freq: Every day | ORAL | Status: DC

## 2019-02-26 MED ORDER — ADULT MULTIVITAMIN W/MINERALS CH: 1.0000 | ORAL_TABLET | Freq: Every day | ORAL | Status: DC

## 2019-02-26 NOTE — Social Work (Signed)
Pt ambulatory, eating regular meals, per RNs [place] and [place].   Referral made again to [facility] and [facility] for placement. Pt is voluntary.   Octavio Graves, MSW, LCSW [facility] Clinical Social Work

## 2019-02-26 NOTE — Discharge Summary (Signed)
Physician Discharge Summary  Molly Callahan:[id number] DOB: [birth year] DOA: 02/17/2019  PCP: Overton Mam, DO  Admit date: 02/17/2019 Discharge date: 02/26/2019  Admitted From: Home Disposition: Inpatient psych facility  Recommendations for Outpatient Follow-up:  1. Follow up with PCP after discharge from psych 2. Please obtain BMP/CBC/magnesium in one week  Discharge Condition: Stable CODE STATUS: Full code Diet recommendation: Regular diet   Discharge summary: 19 year old with history of anxiety, depression, previous suicidal ideation brought to the hospital by EMS, called by family as patient was complaining of headache, dizziness and nausea.  Patient admitted ingesting 35-45 tablets of Lexapro 20 mg and about similar number of Wellbutrin XL 300 mg.  In the emergency room, 1/27: Posturing and seizure-like activity in the ER, intubated, Poison control notified and activated charcoal given. 1/28: QTC prolongation, aggressive electrolyte replacement, treated for seizure. 1/29: LTM with no seizures, blood pressures improved. 1/30: VT arrest, defibrillated x1, time to ROSC less than 3 minutes.    Secondary to QTC prolongation. 2/1: Self extubated, hoarse voice risk of aspiration but maintaining airway.  Treated for aspiration pneumonia with antibiotic for 7 days. 2/5: Patient started eating regular diet.  She is ambulating in the hallway.    She does have some dry cough. Some anxiety was given Ativan, none since last 24 hours.  # Intentional drug overdose/suicidal attempt complicated by serotonin syndrome and myoclonus with history of anxiety and depression: Clinically improved.  All SSRI/SNRI discontinued. While in the hospital, using ativan for anxiety, sleep and agitation.   QTC, 450.  Corrected. We will continue potassium and magnesium supplement. Recheck level in 1 week.  Continue suicide precautions. Medically stable to transfer to inpatient psych when bed is  available.   Patient will need inpatient psychiatric treatment, medication adjustment and titration. Active medical issues have resolved, transfer to inpatient psych today. Patient has signed a Physicist, medical.    Discharge Diagnoses:  Principal Problem:   MDD (major depressive disorder), recurrent severe, without psychosis (HCC) Active Problems:   Overdose   Acute respiratory failure with hypoxemia (HCC)   Aspiration pneumonia (HCC)   Prolonged QT interval   Seizure (HCC)   Encounter for central line placement   Malnutrition of moderate degree   Serotonin syndrome   Encephalopathy   Suicide attempt [facility])    Discharge Instructions  Discharge Instructions    Diet general   Complete by: As directed    Discharge instructions   Complete by: As directed    Recheck potassium and magnesium in about 1 week. Can use over-the-counter cough medications.   Increase activity slowly   Complete by: As directed      Allergies as of 02/26/2019      Reactions   Pollen Extract Other (See Comments)   Teary eyes      Medication List    STOP taking these medications   buPROPion 300 MG 24 hr tablet Commonly known as: WELLBUTRIN XL   escitalopram 20 MG tablet Commonly known as: LEXAPRO   ibuprofen 200 MG tablet Commonly known as: ADVIL   MELATONIN PO     TAKE these medications   magnesium oxide 400 (241.3 Mg) MG tablet Commonly known as: MAG-OX Take 1 tablet (400 mg total) by mouth 2 (two) times daily.   multivitamin with minerals Tabs tablet Take 1 tablet by mouth daily. Start taking on: February 27, 2019   potassium chloride SA 20 MEQ tablet Commonly known as: KLOR-CON Take 1 tablet (20 mEq total) by mouth  daily. Start taking on: February 27, 2019       Allergies  Allergen Reactions  . Pollen Extract Other (See Comments)    Teary eyes    Consultations:  Psychiatry  Critical care  Poison control   Procedures/Studies: DG Abdomen 1 View  Result  Date: 02/17/2019 CLINICAL DATA:  OG tube EXAM: ABDOMEN - 1 VIEW COMPARISON:  None. FINDINGS: Esophageal tube tip overlies the gastric body. Nonobstructed bowel-gas pattern. IMPRESSION: Esophageal tube tip overlies the gastric body Electronically Signed   By: Jasmine Pang M.D.   On: 02/17/2019 22:14   CT HEAD WO CONTRAST  Result Date: 02/18/2019 CLINICAL DATA:  Seizure. EXAM: CT HEAD WITHOUT CONTRAST TECHNIQUE: Contiguous axial images were obtained from the base of the skull through the vertex without intravenous contrast. COMPARISON:  February 17, 2019 FINDINGS: Brain: No evidence of acute infarction, hemorrhage, hydrocephalus, extra-axial collection or mass lesion/mass effect. Vascular: No hyperdense vessel or unexpected calcification. Skull: Normal. Negative for fracture or focal lesion. Sinuses/Orbits: No acute finding. Other: None. IMPRESSION: No acute intracranial pathology. Electronically Signed   By: Aram Candela M.D.   On: 02/18/2019 15:26   CT Head Wo Contrast  Result Date: 02/17/2019 CLINICAL DATA:  19 year old female status post overdose, complaining of headache dizziness and nausea within subsequent seizure like activity. Combative. EXAM: CT HEAD WITHOUT CONTRAST TECHNIQUE: Contiguous axial images were obtained from the base of the skull through the vertex without intravenous contrast. COMPARISON:  None. FINDINGS: Brain: Normal cerebral volume. No midline shift, ventriculomegaly, mass effect, evidence of mass lesion, intracranial hemorrhage or evidence of cortically based acute infarction. Gray-white matter differentiation is within normal limits throughout the brain. Vascular: No suspicious intracranial vascular hyperdensity. Skull: Negative. Sinuses/Orbits: Visualized paranasal sinuses and mastoids are clear. Other: Visualized orbits and scalp soft tissues are within normal limits. IMPRESSION: Normal noncontrast CT appearance of the brain. Electronically Signed   By: Odessa Fleming M.D.   On:  02/17/2019 23:09   MR BRAIN W WO CONTRAST  Result Date: 02/23/2019 CLINICAL DATA:  Drug overdose. Headache, dizziness and nausea. Seizure. EXAM: MRI HEAD WITHOUT AND WITH CONTRAST MRV HEAD WITHOUT CONTRAST TECHNIQUE: Multiplanar, multiecho pulse sequences of the brain and surrounding structures were obtained without and with intravenous contrast. Angiographic images of the intracranial venous structures were obtained using MRV technique without intravenous contrast. CONTRAST:  5mL GADAVIST GADOBUTROL 1 MMOL/ML IV SOLN COMPARISON:  None. FINDINGS: MRI brain findings BRAIN: No acute infarct, acute hemorrhage or extra-axial collection. Normal white matter signal for age. Normal volume of brain parenchyma and CSF spaces. Midline structures are normal. There is no abnormal contrast enhancement. VASCULAR: Major flow voids are preserved. Susceptibility-sensitive sequences show no chronic microhemorrhage or superficial siderosis. SKULL AND UPPER CERVICAL SPINE: Normal calvarium and skull base. Visualized upper cervical spine and soft tissues are normal. SINUSES/ORBITS: No fluid levels or advanced mucosal thickening. No mastoid or middle ear effusion. Normal orbits. MRV brain findings Superior sagittal sinus: Normal. Straight sinus: Normal. Inferior sagittal sinus, vein of Galen and internal cerebral veins: Normal. Transverse sinuses: Normal. Sigmoid sinuses: Normal. Visualized jugular veins: Normal. IMPRESSION: Normal MRI and MRV of the brain. Electronically Signed   By: Deatra Robinson M.D.   On: 02/23/2019 03:17   DG Chest Port 1 View  Result Date: 02/23/2019 CLINICAL DATA:  History of aspiration pneumonia. Extubation. EXAM: PORTABLE CHEST 1 VIEW COMPARISON:  02/22/2019 FINDINGS: The endotracheal tube and NG tubes have been removed. The left subclavian central venous catheter is stable. Persistent peribronchial  thickening and increased interstitial markings in the right lung without focal airspace consolidation. The  left lung base demonstrates improved aeration. No pleural effusions or pneumothorax. IMPRESSION: 1. Interval extubation and removal of NG tube. 2. Improved aeration at the left lung base. 3. Persistent largely interstitial process in the right lung. Electronically Signed   By: Marijo Sanes M.D.   On: 02/23/2019 09:01   DG Chest Port 1 View  Result Date: 02/22/2019 CLINICAL DATA:  Follow-up aspiration pneumonia, ETT/OGT EXAM: PORTABLE CHEST 1 VIEW COMPARISON:  02/20/2019 FINDINGS: Endotracheal tube terminates 4 cm above the carina. Enteric tube terminates in the mid gastric body. Mild right perihilar opacity, unchanged. Right lower lobe opacity has improved. Mild left lower lobe opacity, new. No definite pleural effusion.  No pneumothorax. The heart is normal in size. Stable left subclavian venous catheter terminating at the cavoatrial junction. IMPRESSION: Endotracheal tube terminates 4 cm above the carina. Additional support apparatus as above. Waxing/waning multifocal opacities in this patient with suspected aspiration pneumonia. Electronically Signed   By: Julian Hy M.D.   On: 02/22/2019 09:00   DG CHEST PORT 1 VIEW  Result Date: 02/20/2019 CLINICAL DATA:  Followup ventilator support. EXAM: PORTABLE CHEST 1 VIEW COMPARISON:  02/19/2019 FINDINGS: Endotracheal tube 6 cm above the carina. Orogastric or nasogastric 2 enters the abdomen. Left subclavian central line tip at the SVC RA junction. Left chest shows only mild atelectasis at the left base. On the right, there is increasing density apparently due to accumulating pleural fluid as well as right lower lung atelectasis/infiltrate. IMPRESSION: Lines and tubes well position. Improvement on the left with only mild atelectasis at the left base. Worsened appearance on the right with accumulating pleural fluid and worsened right lower lung volume loss/infiltrate. Electronically Signed   By: Nelson Chimes M.D.   On: 02/20/2019 06:52   DG Chest Port 1  View  Result Date: 02/19/2019 CLINICAL DATA:  Aspiration pneumonia, ETT, OD EXAM: PORTABLE CHEST 1 VIEW COMPARISON:  Radiograph 02/18/2019 FINDINGS: Endotracheal tube terminates in the mid trachea, 5 cm from the carina. Left subclavian line tip terminates at the superior cavoatrial junction. Transesophageal tube tip and side port distal to the GE junction. Mixed interstitial and more consolidative perihilar opacity present throughout the right hemithorax with the left lung has largely cleared since comparison radiograph 1 day prior. Cardiomediastinal contours are stable. No acute osseous or soft tissue abnormality. IMPRESSION: Near complete resolution of the opacities in the left hemithorax likely reflecting resolving atelectasis Persistent opacity throughout the right lung may reflect sequela of aspiration and/or edema. Lines and tubes as above. Electronically Signed   By: Lovena Le M.D.   On: 02/19/2019 05:45   DG Chest Port 1 View  Result Date: 02/18/2019 CLINICAL DATA:  Central line placement EXAM: PORTABLE CHEST 1 VIEW COMPARISON:  None. FINDINGS: Left subclavian central venous catheter tip in the SVC near the cavoatrial junction. No pneumothorax. Endotracheal tube in good position. Bilateral infiltrates right greater than left mild interval improvement. No effusion. IMPRESSION: Central line tip SVC.  No pneumothorax. Mild improvement in bilateral infiltrates right greater than left. Electronically Signed   By: Franchot Gallo M.D.   On: 02/18/2019 09:34   DG Chest Port 1 View  Result Date: 02/18/2019 CLINICAL DATA:  COVID.  Low blood pressure. EXAM: PORTABLE CHEST 1 VIEW COMPARISON:  02/17/2019. FINDINGS: Endotracheal tube and NG tube in stable position. Heart size normal. Diffuse severe bilateral pulmonary infiltrates/edema, right side greater than left. No pleural effusion  or pneumothorax. IMPRESSION: 1.  Endotracheal tube and NG tube stable position. 2. Diffuse severe bilateral pulmonary  infiltrates/edema, right side greater than left noted on today's exam. Electronically Signed   By: Maisie Fus  Register   On: 02/18/2019 08:15   DG Chest Portable 1 View  Result Date: 02/17/2019 CLINICAL DATA:  Post intubation EXAM: PORTABLE CHEST 1 VIEW COMPARISON:  None. FINDINGS: Endotracheal tube tip is about 1.5 cm superior to the carina. Esophageal tube tip is below the diaphragm but non included. Heart size is normal. Lung fields are clear IMPRESSION: Endotracheal tube tip about 1.5 cm superior to the carina. Clear lung fields Electronically Signed   By: Jasmine Pang M.D.   On: 02/17/2019 22:13   DG Swallowing Func-Speech Pathology  Result Date: 02/24/2019 Objective Swallowing Evaluation: Type of Study: MBS-Modified Barium Swallow Study  Patient Details Name: ZERIYAH PLETT MRN: [id number] Date of Birth: Sep 19, 2000 Today's Date: 02/24/2019 Time: SLP Start Time (ACUTE ONLY): 1405 -SLP Stop Time (ACUTE ONLY): 1425 SLP Time Calculation (min) (ACUTE ONLY): 20 min Past Medical History: Past Medical History: Diagnosis Date . Anxiety  . 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 Depression  . Suicidal ideation 03/19/2016 Past Surgical History: Past Surgical History: Procedure Laterality Date . NO PAST SURGERIES   HPI: SHYVONNE PAULEY is a 19 y.o. female.  Presents to ER after intentional overdose. Estimated 35-45 tablets of Lexapro 20 mg and 35 to 45 tablets Wellbutrin XL 300 mg. Pt was intubated on 1/27, and self extubated on 2/1. History of depression, anxiety, past history of suicidal ideation.  Subjective: alert, requires cueing Assessment / Plan / Recommendation CHL IP CLINICAL IMPRESSIONS 02/24/2019 Clinical Impression Pt's oropharyngeal swallow is WFL, showing improvements in swallowing and vocal quality since even this morning. This afternoon she is capable of swallowing a variety of consistencies with age-appropriate timeliness and strength; no aspiration or penetration. At times she has small amounts of  premature spillage and she masticated a barium tablet despite cues not to, exhibiting behaviors that appeared to be related to mentation more so than true functional abilities. Recommend starting regular solids and thin liquids. SLP will f/u briefly for tolerance given mental status, but do not anticipate any needs post-discharge. SLP Visit Diagnosis Dysphagia, oropharyngeal phase (R13.12) Attention and concentration deficit following -- Frontal lobe and executive function deficit following -- Impact on safety and function Mild aspiration risk   CHL IP TREATMENT RECOMMENDATION 02/24/2019 Treatment Recommendations Therapy as outlined in treatment plan below   Prognosis 02/24/2019 Prognosis for Safe Diet Advancement Good Barriers to Reach Goals Cognitive deficits Barriers/Prognosis Comment -- CHL IP DIET RECOMMENDATION 02/24/2019 SLP Diet Recommendations Regular solids;Thin liquid Liquid Administration via Cup;Straw Medication Administration Whole meds with liquid Compensations Small sips/bites;Slow rate;Minimize environmental distractions Postural Changes Seated upright at 90 degrees   CHL IP OTHER RECOMMENDATIONS 02/24/2019 Recommended Consults -- Oral Care Recommendations Oral care BID Other Recommendations --   CHL IP FOLLOW UP RECOMMENDATIONS 02/24/2019 Follow up Recommendations 24 hour supervision/assistance   CHL IP FREQUENCY AND DURATION 02/24/2019 Speech Therapy Frequency (ACUTE ONLY) min 1 x/week Treatment Duration 1 week      CHL IP ORAL PHASE 02/24/2019 Oral Phase WFL Oral - Pudding Teaspoon -- Oral - Pudding Cup -- Oral - Honey Teaspoon -- Oral - Honey Cup -- Oral - Nectar Teaspoon -- Oral - Nectar Cup -- Oral - Nectar Straw -- Oral - Thin Teaspoon -- Oral - Thin Cup -- Oral - Thin Straw -- Oral - Puree -- Oral -  Mech Soft -- Oral - Regular -- Oral - Multi-Consistency -- Oral - Pill -- Oral Phase - Comment --  CHL IP PHARYNGEAL PHASE 02/24/2019 Pharyngeal Phase WFL Pharyngeal- Pudding Teaspoon -- Pharyngeal --  Pharyngeal- Pudding Cup -- Pharyngeal -- Pharyngeal- Honey Teaspoon -- Pharyngeal -- Pharyngeal- Honey Cup -- Pharyngeal -- Pharyngeal- Nectar Teaspoon -- Pharyngeal -- Pharyngeal- Nectar Cup -- Pharyngeal -- Pharyngeal- Nectar Straw -- Pharyngeal -- Pharyngeal- Thin Teaspoon -- Pharyngeal -- Pharyngeal- Thin Cup -- Pharyngeal -- Pharyngeal- Thin Straw -- Pharyngeal -- Pharyngeal- Puree -- Pharyngeal -- Pharyngeal- Mechanical Soft -- Pharyngeal -- Pharyngeal- Regular -- Pharyngeal -- Pharyngeal- Multi-consistency -- Pharyngeal -- Pharyngeal- Pill -- Pharyngeal -- Pharyngeal Comment --  CHL IP CERVICAL ESOPHAGEAL PHASE 02/24/2019 Cervical Esophageal Phase WFL Pudding Teaspoon -- Pudding Cup -- Honey Teaspoon -- Honey Cup -- Nectar Teaspoon -- Nectar Cup -- Nectar Straw -- Thin Teaspoon -- Thin Cup -- Thin Straw -- Puree -- Mechanical Soft -- Regular -- Multi-consistency -- Pill -- Cervical Esophageal Comment -- Mahala Menghini., M.A. CCC-SLP Acute Rehabilitation Services Pager [phone] Office [phone] 02/24/2019, 3:02 PM              EEG adult  Result Date: 02/18/2019 Charlsie Quest, MD     02/18/2019  1:12 PM Patient Name: SINDA LEEDOM MRN: [id number] Epilepsy Attending: Charlsie Quest Referring Physician/Provider: Dr Sandrea Hughs Date: 02/18/2019 Duration: 22.30 minutes Patient history: 19 year old female presented after suicidal overdose with Lexapro, had cardiac arrest, ROSC achieved after 3 minutes and now having whole-body jerking episodes.  EEG to evaluate for seizures. Level of alertness: Comatose AEDs during EEG study: Versed Technical aspects: This EEG study was done with scalp electrodes positioned according to the 10-20 International system of electrode placement. Electrical activity was acquired at a sampling rate of 500Hz  and reviewed with a high frequency filter of 70Hz  and a low frequency filter of 1Hz . EEG data were recorded continuously and digitally stored. Description: At the onset EEG  showed continuous generalized 2 to 3 Hz delta slowing.  At 10 AM, per EEG tech documentation patient had sudden eye opening and whole-body jerking.  Concomitant EEG showed generalized periodic epileptiform discharges. Three more similar events were captured at 10:01:01, 10:01:17 and 10:01:32. These events lasted 5-10 seconds and are consistent with myoclonic seizures. Between events EEG showed generalized EEG suppression lasting 3-6 seconds. Hyperventilation and photic stimulation were not performed due to ams. Abnormality -Myoclonic seizures, generalized -Continuous slow, generalized IMPRESSION: This study showed four myoclonic seizures characterized by whole body twitching and eye opening with generalized onset, lasting 5 to 10 seconds each.  Initial EEG showed evidence of severe diffuse encephalopathy, nonspecific etiology. Dr. Wilford Corner was notified immediately.   Overnight EEG with video  Result Date: 02/19/2019 Charlsie Quest, MD     02/20/2019 11:18 AM Patient Name: ELLESSE ANTENUCCI MRN: [id number] Epilepsy Attending: Charlsie Quest Referring Physician/Provider: Dr Milon Dikes Duration: 02/18/2019 1511 to 02/19/2019 1511  Patient history: 19 year old female presented after suicidal overdose with Lexapro, had cardiac arrest, ROSC achieved after 3 minutes and now having whole-body jerking episodes.  EEG to evaluate for seizures.  Level of alertness: awake, sedated  AEDs during EEG study: Versed, VPA  Technical aspects: This EEG study was done with scalp electrodes positioned according to the 10-20 International system of electrode placement. Electrical activity was acquired at a sampling rate of 500Hz  and reviewed with a high frequency filter of 70Hz  and a low frequency filter of 1Hz . EEG data  were recorded continuously and digitally stored. Description: EEG showed continuous generalized 2-3Hz  delta slowing with an excessive amount of 15 to 18 Hz, 2-3 uV beta activity with irregular morphology distributed  symmetrically and diffusely. Hyperventilation and photic stimulation were not performed due to ams. Event button was pressed on 02/18/2019 at 1627. On video patient was noted to have whole body movement and cough which appeared voluntary after patient was stimulated. Concomitant EEG before during and after the event didn't show any eeg change to suggest seizure. Event button was pressed on 02/18/2019 at 1804 and 1806 for unclear reasons. Concomitant EEG before during and after the event didn't show any eeg change to suggest seizure.  Abnormality -Continuous slow, generalized -Excessive beta, generalized  IMPRESSION: This study showed evidence of severe diffuse encephalopathy, nonspecific etiology but could be secondary to sedation. No seizures and epileptiform discharges were seen during this study. Event button was pressed on 02/18/2019 at 1627, 1804 and 1806 as described above without EEG change and therefore were most likely non epileptic. Charlsie Quest   MR MRV HEAD WO CM  Result Date: 02/23/2019 CLINICAL DATA:  Drug overdose. Headache, dizziness and nausea. Seizure. EXAM: MRI HEAD WITHOUT AND WITH CONTRAST MRV HEAD WITHOUT CONTRAST TECHNIQUE: Multiplanar, multiecho pulse sequences of the brain and surrounding structures were obtained without and with intravenous contrast. Angiographic images of the intracranial venous structures were obtained using MRV technique without intravenous contrast. CONTRAST:  82mL GADAVIST GADOBUTROL 1 MMOL/ML IV SOLN COMPARISON:  None. FINDINGS: MRI brain findings BRAIN: No acute infarct, acute hemorrhage or extra-axial collection. Normal white matter signal for age. Normal volume of brain parenchyma and CSF spaces. Midline structures are normal. There is no abnormal contrast enhancement. VASCULAR: Major flow voids are preserved. Susceptibility-sensitive sequences show no chronic microhemorrhage or superficial siderosis. SKULL AND UPPER CERVICAL SPINE: Normal calvarium and skull  base. Visualized upper cervical spine and soft tissues are normal. SINUSES/ORBITS: No fluid levels or advanced mucosal thickening. No mastoid or middle ear effusion. Normal orbits. MRV brain findings Superior sagittal sinus: Normal. Straight sinus: Normal. Inferior sagittal sinus, vein of Galen and internal cerebral veins: Normal. Transverse sinuses: Normal. Sigmoid sinuses: Normal. Visualized jugular veins: Normal. IMPRESSION: Normal MRI and MRV of the brain. Electronically Signed   By: Deatra Robinson M.D.   On: 02/23/2019 03:17   ECHOCARDIOGRAM COMPLETE BUBBLE STUDY  Result Date: 02/18/2019   ECHOCARDIOGRAM REPORT   Patient Name:   CLELLA MCKEEL Date of Exam: 02/18/2019 Medical Rec #:  [id number]      Height:       66.5 in Accession #:    [id number]     Weight:       111.8 lb Date of Birth:  January 29, 2000      BSA:          1.57 m Patient Age:    18 years       BP:           101/61 mmHg Patient Gender: F              HR:           123 bpm. Exam Location:  Inpatient Procedure: 2D Echo Indications:    cardiac arrest  History:        Patient has no prior history of Echocardiogram examinations.  Sonographer:    Celene Skeen RDCS (AE) Referring Phys: [id number] Ronnald Ramp O'NEAL  Sonographer Comments: Echo performed with patient supine and on artificial respirator. Image acquisition challenging  due to respiratory motion. restricted mobility IMPRESSIONS  1. Left ventricular ejection fraction, by visual estimation, is 60 to 65%. The left ventricle has normal function. There is no left ventricular hypertrophy.  2. The left ventricle has no regional wall motion abnormalities.  3. Global right ventricle has normal systolic function.The right ventricular size is normal. No increase in right ventricular wall thickness.  4. Left atrial size was normal.  5. Right atrial size was normal.  6. The mitral valve is normal in structure. No evidence of mitral valve regurgitation. No evidence of mitral stenosis.  7. The tricuspid valve  is normal in structure.  8. The tricuspid valve is normal in structure. Tricuspid valve regurgitation is not demonstrated.  9. The aortic valve is normal in structure. Aortic valve regurgitation is not visualized. No evidence of aortic valve sclerosis or stenosis. 10. The pulmonic valve was normal in structure. Pulmonic valve regurgitation is not visualized. 11. The inferior vena cava is normal in size with greater than 50% respiratory variability, suggesting right atrial pressure of 3 mmHg. FINDINGS  Left Ventricle: Left ventricular ejection fraction, by visual estimation, is 60 to 65%. The left ventricle has normal function. The left ventricle has no regional wall motion abnormalities. There is no left ventricular hypertrophy. Normal left atrial pressure. Right Ventricle: The right ventricular size is normal. No increase in right ventricular wall thickness. Global RV systolic function is has normal systolic function. Left Atrium: Left atrial size was normal in size. Right Atrium: Right atrial size was normal in size Pericardium: There is no evidence of pericardial effusion. Mitral Valve: The mitral valve is normal in structure. No evidence of mitral valve regurgitation. No evidence of mitral valve stenosis by observation. Tricuspid Valve: The tricuspid valve is normal in structure. Tricuspid valve regurgitation is not demonstrated. Aortic Valve: The aortic valve is normal in structure. Aortic valve regurgitation is not visualized. The aortic valve is structurally normal, with no evidence of sclerosis or stenosis. Pulmonic Valve: The pulmonic valve was normal in structure. Pulmonic valve regurgitation is not visualized. Pulmonic regurgitation is not visualized. Aorta: The aortic root, ascending aorta and aortic arch are all structurally normal, with no evidence of dilitation or obstruction. Venous: The inferior vena cava is normal in size with greater than 50% respiratory variability, suggesting right atrial  pressure of 3 mmHg. IAS/Shunts: No atrial level shunt detected by color flow Doppler. There is no evidence of a patent foramen ovale. No ventricular septal defect is seen or detected. There is no evidence of an atrial septal defect.  LEFT VENTRICLE PLAX 2D LVIDd:         3.93 cm LV PW:         0.99 cm LV IVS:        0.58 cm  LEFT ATRIUM           Index       RIGHT ATRIUM          Index LA Vol (A4C): 16.0 ml 10.19 ml/m RA Area:     6.29 cm                                   RA Volume:   8.39 ml  5.34 ml/m  AORTIC VALVE LVOT Vmax:   62.10 cm/s LVOT Vmean:  39.100 cm/s LVOT VTI:    0.080 m  SHUNTS Systemic VTI: 0.08 m  Chilton Si MD Electronically signed by  Chilton Si MD Signature Date/Time: 02/18/2019/6:41:17 PM    Final      Subjective: Patient was seen and examined in the morning rounds.  She was awake eating breakfast.  No overnight events.   Discharge Exam: Vitals:   02/25/19 1546 02/25/19 2028  BP: 112/67 115/68  Pulse: (!) 110 89  Resp: 17 18  Temp: 98.2 F (36.8 C) 98.4 F (36.9 C)  SpO2: 97% 99%   Vitals:   02/24/19 1931 02/25/19 0700 02/25/19 1546 02/25/19 2028  BP: 137/63 (!) 113/58 112/67 115/68  Pulse: 95 92 (!) 110 89  Resp: 16 18 17 18   Temp: 98.4 F (36.9 C) 98.4 F (36.9 C) 98.2 F (36.8 C) 98.4 F (36.9 C)  TempSrc: Oral  Oral Oral  SpO2: 100% 99% 97% 99%  Weight:      Height:        General: Pt is alert, awake, not in acute distress, on room air.  Was eating breakfast in the morning on my examination. Cardiovascular: RRR, S1/S2 +, no rubs, no gallops Respiratory: CTA bilaterally, no wheezing, no rhonchi, no added sounds. Abdominal: Soft, NT, ND, bowel sounds + Extremities: no edema, no cyanosis Mood is elated, affect is flat, denies any delusions hallucinations.    The results of significant diagnostics from this hospitalization (including imaging, microbiology, ancillary and laboratory) are listed below for reference.      Microbiology: Recent Results (from the past 240 hour(s))  Respiratory Panel by RT PCR (Flu A&B, Covid) - Nasopharyngeal Swab     Status: None   Collection Time: 02/17/19  9:13 PM   Specimen: Nasopharyngeal Swab  Result Value Ref Range Status   SARS Coronavirus 2 by RT PCR NEGATIVE NEGATIVE Final    Comment: (NOTE) SARS-CoV-2 target nucleic acids are NOT DETECTED. The SARS-CoV-2 RNA is generally detectable in upper respiratoy specimens during the acute phase of infection. The lowest concentration of SARS-CoV-2 viral copies this assay can detect is 131 copies/mL. A negative result does not preclude SARS-Cov-2 infection and should not be used as the sole basis for treatment or other patient management decisions. A negative result may occur with  improper specimen collection/handling, submission of specimen other than nasopharyngeal swab, presence of viral mutation(s) within the areas targeted by this assay, and inadequate number of viral copies (<131 copies/mL). A negative result must be combined with clinical observations, patient history, and epidemiological information. The expected result is Negative. Fact Sheet for Patients:  02/19/19 Fact Sheet for Healthcare Providers:  https://www.moore.com/ This test is not yet ap proved or cleared by the https://www.young.biz/ FDA and  has been authorized for detection and/or diagnosis of SARS-CoV-2 by FDA under an Emergency Use Authorization (EUA). This EUA will remain  in effect (meaning this test can be used) for the duration of the COVID-19 declaration under Section 564(b)(1) of the Act, 21 U.S.C. section 360bbb-3(b)(1), unless the authorization is terminated or revoked sooner.    Influenza A by PCR NEGATIVE NEGATIVE Final   Influenza B by PCR NEGATIVE NEGATIVE Final    Comment: (NOTE) The Xpert Xpress SARS-CoV-2/FLU/RSV assay is intended as an aid in  the diagnosis of influenza from  Nasopharyngeal swab specimens and  should not be used as a sole basis for treatment. Nasal washings and  aspirates are unacceptable for Xpert Xpress SARS-CoV-2/FLU/RSV  testing. Fact Sheet for Patients: [place] Fact Sheet for Healthcare Providers: https://www.moore.com/ This test is not yet approved or cleared by the https://www.young.biz/ FDA and  has been  authorized for detection and/or diagnosis of SARS-CoV-2 by  FDA under an Emergency Use Authorization (EUA). This EUA will remain  in effect (meaning this test can be used) for the duration of the  Covid-19 declaration under Section 564(b)(1) of the Act, 21  U.S.C. section 360bbb-3(b)(1), unless the authorization is  terminated or revoked. Performed at [facility], [address] [place], [place] [address]   MRSA PCR Screening     Status: None   Collection Time: 02/18/19  2:18 AM   Specimen: Nasopharyngeal  Result Value Ref Range Status   MRSA by PCR NEGATIVE NEGATIVE Final    Comment:        The GeneXpert MRSA Assay (FDA approved for NASAL specimens only), is one component of a comprehensive MRSA colonization surveillance program. It is not intended to diagnose MRSA infection nor to guide or monitor treatment for MRSA infections. Performed at [facility], [address] [place], [place] [address]   Culture, respiratory (non-expectorated)     Status: None   Collection Time: 02/18/19 10:30 AM   Specimen: Tracheal Aspirate; Respiratory  Result Value Ref Range Status   Specimen Description   Final    TRACHEAL ASPIRATE Performed at [facility], [address] [place], [place] [address]    Special Requests   Final    NONE Performed at [facility], [address] [place], [place] [address]    Gram Stain NO WBC SEEN NO ORGANISMS SEEN   Final   Culture   Final    RARE Consistent with normal  respiratory flora. Performed at [facility], [address] [place], [place] [address]    Report Status 02/21/2019 FINAL  Final     Labs: BNP (last 3 results) Recent Labs    02/18/19 0637  BNP 37.9   Basic Metabolic Panel: Recent Labs  Lab 02/19/19 2240 02/20/19 0406 02/20/19 1215 02/21/19 0404 02/22/19 0511 02/23/19 0544 02/24/19 1133  NA 140  --   --  139 137 135 137  K 3.8  --   --  3.7 3.9 3.7 3.7  CL 111  --   --  109 108 100 103  CO2 23  --   --  22 20* 24 22  GLUCOSE 127*  --   --  94 102* 90 82  BUN 13  --   --  10 11 13 8   CREATININE 0.86  --   --  0.54 0.33* 0.61 0.70  CALCIUM 8.2*  --   --  8.6* 8.6* 8.8* 9.2  MG 1.9   < > 2.0 1.6* 1.6* 2.1 1.9  PHOS  --    < > 2.0* 3.6 4.6 4.3 3.4   < > = values in this interval not displayed.   Liver Function Tests: Recent Labs  Lab 02/20/19 0406  AST 37  ALT 56*  ALKPHOS 52  BILITOT 0.7  PROT 4.5*  ALBUMIN 2.3*   No results for input(s): LIPASE, AMYLASE in the last 168 hours. No results for input(s): AMMONIA in the last 168 hours. CBC: Recent Labs  Lab 02/20/19 0406 02/21/19 0404 02/22/19 0511 02/23/19 0544 02/24/19 1133  WBC 7.5 5.9 4.5 8.2 6.8  NEUTROABS 6.0 4.4 3.1 6.9 4.8  HGB 8.6* 8.8* 9.4* 10.7* 12.5  HCT 26.4* 26.9* 29.2* 32.9* 38.0  MCV 89.8 89.1 89.6 88.7 86.4  PLT 121* 124* 138* 175 231   Cardiac Enzymes: No results for input(s): CKTOTAL,  CKMB, CKMBINDEX, TROPONINI in the last 168 hours. BNP: Invalid input(s): POCBNP CBG: Recent Labs  Lab 02/22/19 0825 02/22/19 1200 02/22/19 2008 02/22/19 2342 02/23/19 0401  GLUCAP 99 84 80 81 77   D-Dimer No results for input(s): DDIMER in the last 72 hours. Hgb A1c No results for input(s): HGBA1C in the last 72 hours. Lipid Profile No results for input(s): CHOL, HDL, LDLCALC, TRIG, CHOLHDL, LDLDIRECT in the last 72 hours. Thyroid function studies No results for input(s): TSH, T4TOTAL, T3FREE, THYROIDAB in the last 72  hours.  Invalid input(s): FREET3 Anemia work up No results for input(s): VITAMINB12, FOLATE, FERRITIN, TIBC, IRON, RETICCTPCT in the last 72 hours. Urinalysis    Component Value Date/Time   COLORURINE YELLOW 02/17/2019 2113   APPEARANCEUR CLEAR 02/17/2019 2113   LABSPEC 1.011 02/17/2019 2113   PHURINE 5.0 02/17/2019 2113   GLUCOSEU NEGATIVE 02/17/2019 2113   HGBUR NEGATIVE 02/17/2019 2113   BILIRUBINUR NEGATIVE 02/17/2019 2113   KETONESUR NEGATIVE 02/17/2019 2113   PROTEINUR 100 (A) 02/17/2019 2113   NITRITE NEGATIVE 02/17/2019 2113   LEUKOCYTESUR NEGATIVE 02/17/2019 2113   Sepsis Labs Invalid input(s): PROCALCITONIN,  WBC,  LACTICIDVEN Microbiology Recent Results (from the past 240 hour(s))  Respiratory Panel by RT PCR (Flu A&B, Covid) - Nasopharyngeal Swab     Status: None   Collection Time: 02/17/19  9:13 PM   Specimen: Nasopharyngeal Swab  Result Value Ref Range Status   SARS Coronavirus 2 by RT PCR NEGATIVE NEGATIVE Final    Comment: (NOTE) SARS-CoV-2 target nucleic acids are NOT DETECTED. The SARS-CoV-2 RNA is generally detectable in upper respiratoy specimens during the acute phase of infection. The lowest concentration of SARS-CoV-2 viral copies this assay can detect is 131 copies/mL. A negative result does not preclude SARS-Cov-2 infection and should not be used as the sole basis for treatment or other patient management decisions. A negative result may occur with  improper specimen collection/handling, submission of specimen other than nasopharyngeal swab, presence of viral mutation(s) within the areas targeted by this assay, and inadequate number of viral copies (<131 copies/mL). A negative result must be combined with clinical observations, patient history, and epidemiological information. The expected result is Negative. Fact Sheet for Patients:  https://www.moore.com/ Fact Sheet for Healthcare Providers:   https://www.young.biz/ This test is not yet ap proved or cleared by the [place] FDA and  has been authorized for detection and/or diagnosis of SARS-CoV-2 by FDA under an Emergency Use Authorization (EUA). This EUA will remain  in effect (meaning this test can be used) for the duration of the COVID-19 declaration under Section 564(b)(1) of the Act, 21 U.S.C. section 360bbb-3(b)(1), unless the authorization is terminated or revoked sooner.    Influenza A by PCR NEGATIVE NEGATIVE Final   Influenza B by PCR NEGATIVE NEGATIVE Final    Comment: (NOTE) The Xpert Xpress SARS-CoV-2/FLU/RSV assay is intended as an aid in  the diagnosis of influenza from Nasopharyngeal swab specimens and  should not be used as a sole basis for treatment. Nasal washings and  aspirates are unacceptable for Xpert Xpress SARS-CoV-2/FLU/RSV  testing. Fact Sheet for Patients: https://www.moore.com/ Fact Sheet for Healthcare Providers: https://www.young.biz/ This test is not yet approved or cleared by the [place] FDA and  has been authorized for detection and/or diagnosis of SARS-CoV-2 by  FDA under an Emergency Use Authorization (EUA). This EUA will remain  in effect (meaning this test can be used) for the duration of the  Covid-19 declaration under Section 564(b)(1) of  the Act, 21  U.S.C. section 360bbb-3(b)(1), unless the authorization is  terminated or revoked. Performed at [facility], [address] [place], [place] [address]   MRSA PCR Screening     Status: None   Collection Time: 02/18/19  2:18 AM   Specimen: Nasopharyngeal  Result Value Ref Range Status   MRSA by PCR NEGATIVE NEGATIVE Final    Comment:        The GeneXpert MRSA Assay (FDA approved for NASAL specimens only), is one component of a comprehensive MRSA colonization surveillance program. It is not intended to diagnose MRSA infection nor to guide  or monitor treatment for MRSA infections. Performed at [facility], [address] [place], [place] [address]   Culture, respiratory (non-expectorated)     Status: None   Collection Time: 02/18/19 10:30 AM   Specimen: Tracheal Aspirate; Respiratory  Result Value Ref Range Status   Specimen Description   Final    TRACHEAL ASPIRATE Performed at [facility], [address] [place], [place] [address]    Special Requests   Final    NONE Performed at [facility], [address] [place], [place] [address]    Gram Stain NO WBC SEEN NO ORGANISMS SEEN   Final   Culture   Final    RARE Consistent with normal respiratory flora. Performed at [facility], [address] [place], [place] [address]    Report Status 02/21/2019 FINAL  Final     Time coordinating discharge:  32 minutes  SIGNED:   Dorcas CarrowKuber Neveah Bang, MD  [facility] 02/26/2019, 1:36 PM

## 2019-02-26 NOTE — BH Assessment (Signed)
Patient has been accepted to [facility].  Accepted by Nurse Practitioner [facility], on the behalf of Dr. Toni Amend..  Attending  Physician will be Dr. Toni Amend.  Patient has been assigned to room 305, by [facility] Charge Nurse Gigi   Call report to [phone].  Representative/Transfer Coordinator is Warden/ranger Patient pre-admitted by [facility] Patient Access Ronnald Nian.,)   [facility] Rutherford Nail, Disposition Social Worker) made aware of acceptance.

## 2019-02-26 NOTE — Tx Team (Signed)
Initial Treatment Plan 02/26/2019 6:13 PM [name] RAQ:[id number]    PATIENT STRESSORS: Marital or family conflict Traumatic event   PATIENT STRENGTHS: Average or above average intelligence Communication skills Supportive family/friends   PATIENT IDENTIFIED PROBLEMS: Over dosed on medication  Depression                   DISCHARGE CRITERIA:  Ability to meet basic life and health needs Adequate post-discharge living arrangements Medical problems require only outpatient monitoring Verbal commitment to aftercare and medication compliance  PRELIMINARY DISCHARGE PLAN: Attend aftercare/continuing care group Outpatient therapy Return to previous living arrangement  PATIENT/FAMILY INVOLVEMENT: This treatment plan has been presented to and reviewed with the patient, Molly Callahan, and/or family member,.  The patient and family have been given the opportunity to ask questions and make suggestions.  Leonarda Salon, RN 02/26/2019, 6:13 PM

## 2019-02-26 NOTE — TOC Transition Note (Addendum)
Transition of Care [facility]) - CM/SW Discharge Note   Patient Details  Name: Molly Callahan MRN: [id number] Date of Birth: 12-27-2000  [facility]) CM/SW Contact:  Doy Hutching, LCSWA Phone Number: 02/26/2019, 1:43 PM   Clinical Narrative:    Be available at [facility] for today 2/5.  Spoke with Jerilynn Som at [facility], will fax voluntary form to [phone] Spoke with pt mother via telephone with her permission. Provided name of [facility] and phone number; we discussed visitation policy to this writers best understanding.   Final next level of care: Skilled Nursing Facility Barriers to Discharge: Barriers Resolved   Patient Goals and CMS Choice Patient states their goals for this hospitalization and ongoing recovery are:: Behavioral Health CMS Medicare.gov Compare Post Acute Care list provided to:: (n/a) Choice offered to / list presented to : NA  Discharge Placement              Patient chooses bed at: Other - please specify in the comment section below:([facility]) Patient to be transferred to facility by: [facility] Name of family member notified: pt mother via telephone Patient and family notified of of transfer: 02/26/19  Discharge Plan and Services In-house Referral: Clinical Social Work Discharge Planning Services: CM Consult Post Acute Care Choice: NA                               Social Determinants of Health (SDOH) Interventions     Readmission Risk Interventions Readmission Risk Prevention Plan 02/25/2019  Post Dischage Appt Not Complete  Appt Comments plan for transfer to [facility]  Medication Screening Complete  Transportation Screening Complete  Some recent data might be hidden

## 2019-02-26 NOTE — Progress Notes (Signed)
Patient discharged to [facility] via [facility]. AVS and signed voluntary form given to patient upon transport.

## 2019-02-26 NOTE — Progress Notes (Signed)
Report called and given to Charge Nurse Gigi.

## 2019-02-26 NOTE — Progress Notes (Signed)
PROGRESS NOTE    Molly Callahan  ZOX:[id number] DOB: Sep 06, 2000 DOA: 02/17/2019 PCP: Ronnald Nian, DO    Brief Narrative:  19 year old with history of anxiety, depression, previous suicidal ideation brought to the hospital by EMS, called by family as patient was complaining of headache, dizziness and nausea.  Patient admitted ingesting 35-45 tablets of Lexapro 20 mg and about similar number of Wellbutrin XL 300 mg. 1/27: Posturing and seizure-like activity in the ER, intubated, Poison control notified and activated charcoal given. 1/28: QTC prolongation, aggressive electrolyte replacement, treated for seizure. 1/29: LTM with no seizures, blood pressures improved. 1/30: VT arrest, defibrillated x1, time to ROSC less than 3 minutes.  Thought secondary to QTC prolongation. 2/1: Self extubated, hoarse voice risk of aspiration but maintaining airway. 2/2: Transferred to medical floor. 2/5: Patient started eating regular diet.  She is ambulating in the hallway.  Had some.  Some anxiety was given Ativan, none since last night.   Assessment & Plan:   Principal Problem:   MDD (major depressive disorder), recurrent severe, without psychosis ([place]) Active Problems:   Overdose   Acute respiratory failure with hypoxemia (HCC)   Aspiration pneumonia (HCC)   Prolonged QT interval   Seizure ([place])   Encounter for central line placement   Malnutrition of moderate degree   Serotonin syndrome   Encephalopathy   Suicide attempt ([place])  Intentional drug overdose/suicidal attempt complicated by serotonin syndrome and myoclonus with history of anxiety and depression: Clinically improved.  All SSRI/SNRI discontinued. Using short acting Ativan for anxiety, sleep and agitation.   QTC, 450.  Corrected. We will continue potassium and magnesium supplement. Recheck level in 1 week.  Continue one-to-one monitoring, seizure precautions, suicide precautions. Medically stable to transfer to inpatient psych  when bed is available.  Acute respiratory failure with hypoxemia: Extubated to room air.  Treated for aspiration pneumonia, finished 7 days of antibiotics.  Continue mobility and chest physiotherapy.  Prolonged QT interval: Due to SSRI overdose.  Corrected.  Serotonin syndrome: Improved.  Normalized.  Patient will need inpatient psychiatric treatment, medication adjustment and titration. Active medical issues have resolved, transfer to inpatient psych when bed available.   DVT prophylaxis: SCDs Code Status: Full code Family Communication: None, patient's father 02/25/2019. Disposition Plan: patient is from home. Anticipated DC to inpatient psych, Barriers to discharge, medically stable.  Pending inpatient psych bed availability.  Consultants:   Psychiatry  PCCM  Poison control  Procedures:   None  Antimicrobials:  Anti-infectives (From admission, onward)   Start     Dose/Rate Route Frequency Ordered Stop   02/22/19 1100  amoxicillin-clavulanate (AUGMENTIN) 875-125 MG per tablet 1 tablet     1 tablet Per Tube Every 12 hours 02/22/19 1047 02/24/19 2159   02/22/19 1045  amoxicillin-clavulanate (AUGMENTIN) 875-125 MG per tablet 1 tablet  Status:  Discontinued     1 tablet Oral Every 12 hours 02/22/19 1038 02/22/19 1047   02/18/19 1000  ampicillin-sulbactam (UNASYN) 1.5 g in sodium chloride 0.9 % 100 mL IVPB  Status:  Discontinued     1.5 g 200 mL/hr over 30 Minutes Intravenous Every 6 hours 02/18/19 0929 02/22/19 1038         Subjective: Patient seen and examined.  No overnight events.  No more agitations or hallucinations. She was joyful and excessively smiling and eating breakfast in the morning.   Objective: Vitals:   02/24/19 1931 02/25/19 0700 02/25/19 1546 02/25/19 2028  BP: 137/63 (!) 113/58 112/67 115/68  Pulse: 95  92 (!) 110 89  Resp: '16 18 17 18  '$ Temp: 98.4 F (36.9 C) 98.4 F (36.9 C) 98.2 F (36.8 C) 98.4 F (36.9 C)  TempSrc: Oral  Oral Oral  SpO2:  100% 99% 97% 99%  Weight:      Height:        Intake/Output Summary (Last 24 hours) at 02/26/2019 1320 Last data filed at 02/26/2019 0859 Gross per 24 hour  Intake 720 ml  Output -  Net 720 ml   Filed Weights   02/19/19 0500 02/21/19 0404 02/22/19 0456  Weight: 50 kg 50.5 kg 51.1 kg    Examination: Physical Exam  Constitutional: She is oriented to person, place, and time.  Awake and joyful.  Walking around.  On room air.  HENT:  Head: Normocephalic.  Eyes: Pupils are equal, round, and reactive to light.  Cardiovascular: Regular rhythm.  Pulmonary/Chest: Breath sounds normal.  Musculoskeletal:     Cervical back: Neck supple.  Neurological: She is alert and oriented to person, place, and time.  Psychiatric:  Denies any hallucinations or delusions today.      Data Reviewed: I have personally reviewed following labs and imaging studies  CBC: Recent Labs  Lab 02/20/19 0406 02/21/19 0404 02/22/19 0511 02/23/19 0544 02/24/19 1133  WBC 7.5 5.9 4.5 8.2 6.8  NEUTROABS 6.0 4.4 3.1 6.9 4.8  HGB 8.6* 8.8* 9.4* 10.7* 12.5  HCT 26.4* 26.9* 29.2* 32.9* 38.0  MCV 89.8 89.1 89.6 88.7 86.4  PLT 121* 124* 138* 175 007   Basic Metabolic Panel: Recent Labs  Lab 02/19/19 2240 02/20/19 0406 02/20/19 1215 02/21/19 0404 02/22/19 0511 02/23/19 0544 02/24/19 1133  NA 140  --   --  139 137 135 137  K 3.8  --   --  3.7 3.9 3.7 3.7  CL 111  --   --  109 108 100 103  CO2 23  --   --  22 20* 24 22  GLUCOSE 127*  --   --  94 102* 90 82  BUN 13  --   --  '10 11 13 8  '$ CREATININE 0.86  --   --  0.54 0.33* 0.61 0.70  CALCIUM 8.2*  --   --  8.6* 8.6* 8.8* 9.2  MG 1.9   < > 2.0 1.6* 1.6* 2.1 1.9  PHOS  --    < > 2.0* 3.6 4.6 4.3 3.4   < > = values in this interval not displayed.   GFR: Estimated Creatinine Clearance: 90.5 mL/min (by C-G formula based on SCr of 0.7 mg/dL). Liver Function Tests: Recent Labs  Lab 02/20/19 0406  AST 37  ALT 56*  ALKPHOS 52  BILITOT 0.7  PROT 4.5*   ALBUMIN 2.3*   No results for input(s): LIPASE, AMYLASE in the last 168 hours. No results for input(s): AMMONIA in the last 168 hours. Coagulation Profile: Recent Labs  Lab 02/20/19 0406  INR 1.4*   Cardiac Enzymes: No results for input(s): CKTOTAL, CKMB, CKMBINDEX, TROPONINI in the last 168 hours. BNP (last 3 results) No results for input(s): PROBNP in the last 8760 hours. HbA1C: No results for input(s): HGBA1C in the last 72 hours. CBG: Recent Labs  Lab 02/22/19 0825 02/22/19 1200 02/22/19 2008 02/22/19 2342 02/23/19 0401  GLUCAP 99 84 80 81 77   Lipid Profile: No results for input(s): CHOL, HDL, LDLCALC, TRIG, CHOLHDL, LDLDIRECT in the last 72 hours. Thyroid Function Tests: No results for input(s): TSH, T4TOTAL, FREET4, T3FREE, THYROIDAB in  the last 72 hours. Anemia Panel: No results for input(s): VITAMINB12, FOLATE, FERRITIN, TIBC, IRON, RETICCTPCT in the last 72 hours. Sepsis Labs: Recent Labs  Lab 02/20/19 0000 02/20/19 0406 02/21/19 0404  PROCALCITON  --  0.94 0.52  LATICACIDVEN 1.7  --   --     Recent Results (from the past 240 hour(s))  Respiratory Panel by RT PCR (Flu A&B, Covid) - Nasopharyngeal Swab     Status: None   Collection Time: 02/17/19  9:13 PM   Specimen: Nasopharyngeal Swab  Result Value Ref Range Status   SARS Coronavirus 2 by RT PCR NEGATIVE NEGATIVE Final    Comment: (NOTE) SARS-CoV-2 target nucleic acids are NOT DETECTED. The SARS-CoV-2 RNA is generally detectable in upper respiratoy specimens during the acute phase of infection. The lowest concentration of SARS-CoV-2 viral copies this assay can detect is 131 copies/mL. A negative result does not preclude SARS-Cov-2 infection and should not be used as the sole basis for treatment or other patient management decisions. A negative result may occur with  improper specimen collection/handling, submission of specimen other than nasopharyngeal swab, presence of viral mutation(s) within the  areas targeted by this assay, and inadequate number of viral copies (<131 copies/mL). A negative result must be combined with clinical observations, patient history, and epidemiological information. The expected result is Negative. Fact Sheet for Patients:  PinkCheek.be Fact Sheet for Healthcare Providers:  GravelBags.it This test is not yet ap proved or cleared by the [place] FDA and  has been authorized for detection and/or diagnosis of SARS-CoV-2 by FDA under an Emergency Use Authorization (EUA). This EUA will remain  in effect (meaning this test can be used) for the duration of the COVID-19 declaration under Section 564(b)(1) of the Act, 21 U.S.C. section 360bbb-3(b)(1), unless the authorization is terminated or revoked sooner.    Influenza A by PCR NEGATIVE NEGATIVE Final   Influenza B by PCR NEGATIVE NEGATIVE Final    Comment: (NOTE) The Xpert Xpress SARS-CoV-2/FLU/RSV assay is intended as an aid in  the diagnosis of influenza from Nasopharyngeal swab specimens and  should not be used as a sole basis for treatment. Nasal washings and  aspirates are unacceptable for Xpert Xpress SARS-CoV-2/FLU/RSV  testing. Fact Sheet for Patients: PinkCheek.be Fact Sheet for Healthcare Providers: GravelBags.it This test is not yet approved or cleared by the [place] FDA and  has been authorized for detection and/or diagnosis of SARS-CoV-2 by  FDA under an Emergency Use Authorization (EUA). This EUA will remain  in effect (meaning this test can be used) for the duration of the  Covid-19 declaration under Section 564(b)(1) of the Act, 21  U.S.C. section 360bbb-3(b)(1), unless the authorization is  terminated or revoked. Performed at [facility], [place] [address], [place], [place] [address]   MRSA PCR Screening     Status: None   Collection Time:  02/18/19  2:18 AM   Specimen: Nasopharyngeal  Result Value Ref Range Status   MRSA by PCR NEGATIVE NEGATIVE Final    Comment:        The GeneXpert MRSA Assay (FDA approved for NASAL specimens only), is one component of a comprehensive MRSA colonization surveillance program. It is not intended to diagnose MRSA infection nor to guide or monitor treatment for MRSA infections. Performed at [facility], [place] [address], [place], [place] [address]   Culture, respiratory (non-expectorated)     Status: None   Collection Time: 02/18/19 10:30 AM   Specimen: Tracheal  Aspirate; Respiratory  Result Value Ref Range Status   Specimen Description   Final    TRACHEAL ASPIRATE Performed at [facility] [address], [place], [place] [address]    Special Requests   Final    NONE Performed at [facility], [place] [address] [place], [place] [address]    Gram Stain NO WBC SEEN NO ORGANISMS SEEN   Final   Culture   Final    RARE Consistent with normal respiratory flora. Performed at [facility], [place] [address] [place], [place] [address]    Report Status 02/21/2019 FINAL  Final         Radiology Studies: DG Swallowing Func-Speech Pathology  Result Date: 02/24/2019 Objective Swallowing Evaluation: Type of Study: MBS-Modified Barium Swallow Study  Patient Details Name: Molly Callahan MRN: [id number] Date of Birth: January 03, 2001 Today's Date: 02/24/2019 Time: SLP Start Time (ACUTE ONLY): 9449 -SLP Stop Time (ACUTE ONLY): 1425 SLP Time Calculation (min) (ACUTE ONLY): 20 min Past Medical History: Past Medical History: Diagnosis Date . Anxiety  . 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 Depression  . Suicidal ideation 03/19/2016 Past Surgical History: Past Surgical History: Procedure Laterality Date . NO PAST SURGERIES   HPI: LUTISHA KNOCHE is a 19 y.o. female.  Presents to ER after intentional overdose. Estimated 35-45 tablets of Lexapro 20  mg and 35 to 45 tablets Wellbutrin XL 300 mg. Pt was intubated on 1/27, and self extubated on 2/1. History of depression, anxiety, past history of suicidal ideation.  Subjective: alert, requires cueing Assessment / Plan / Recommendation CHL IP CLINICAL IMPRESSIONS 02/24/2019 Clinical Impression Pt's oropharyngeal swallow is WFL, showing improvements in swallowing and vocal quality since even this morning. This afternoon she is capable of swallowing a variety of consistencies with age-appropriate timeliness and strength; no aspiration or penetration. At times she has small amounts of premature spillage and she masticated a barium tablet despite cues not to, exhibiting behaviors that appeared to be related to mentation more so than true functional abilities. Recommend starting regular solids and thin liquids. SLP will f/u briefly for tolerance given mental status, but do not anticipate any needs post-discharge. SLP Visit Diagnosis Dysphagia, oropharyngeal phase (R13.12) Attention and concentration deficit following -- Frontal lobe and executive function deficit following -- Impact on safety and function Mild aspiration risk   CHL IP TREATMENT RECOMMENDATION 02/24/2019 Treatment Recommendations Therapy as outlined in treatment plan below   Prognosis 02/24/2019 Prognosis for Safe Diet Advancement Good Barriers to Reach Goals Cognitive deficits Barriers/Prognosis Comment -- CHL IP DIET RECOMMENDATION 02/24/2019 SLP Diet Recommendations Regular solids;Thin liquid Liquid Administration via Cup;Straw Medication Administration Whole meds with liquid Compensations Small sips/bites;Slow rate;Minimize environmental distractions Postural Changes Seated upright at 90 degrees   CHL IP OTHER RECOMMENDATIONS 02/24/2019 Recommended Consults -- Oral Care Recommendations Oral care BID Other Recommendations --   CHL IP FOLLOW UP RECOMMENDATIONS 02/24/2019 Follow up Recommendations 24 hour supervision/assistance   CHL IP FREQUENCY AND DURATION  02/24/2019 Speech Therapy Frequency (ACUTE ONLY) min 1 x/week Treatment Duration 1 week      CHL IP ORAL PHASE 02/24/2019 Oral Phase WFL Oral - Pudding Teaspoon -- Oral - Pudding Cup -- Oral - Honey Teaspoon -- Oral - Honey Cup -- Oral - Nectar Teaspoon -- Oral - Nectar Cup -- Oral - Nectar Straw -- Oral - Thin Teaspoon -- Oral - Thin Cup -- Oral - Thin Straw -- Oral - Puree -- Oral - Mech Soft -- Oral -  Regular -- Oral - Multi-Consistency -- Oral - Pill -- Oral Phase - Comment --  CHL IP PHARYNGEAL PHASE 02/24/2019 Pharyngeal Phase WFL Pharyngeal- Pudding Teaspoon -- Pharyngeal -- Pharyngeal- Pudding Cup -- Pharyngeal -- Pharyngeal- Honey Teaspoon -- Pharyngeal -- Pharyngeal- Honey Cup -- Pharyngeal -- Pharyngeal- Nectar Teaspoon -- Pharyngeal -- Pharyngeal- Nectar Cup -- Pharyngeal -- Pharyngeal- Nectar Straw -- Pharyngeal -- Pharyngeal- Thin Teaspoon -- Pharyngeal -- Pharyngeal- Thin Cup -- Pharyngeal -- Pharyngeal- Thin Straw -- Pharyngeal -- Pharyngeal- Puree -- Pharyngeal -- Pharyngeal- Mechanical Soft -- Pharyngeal -- Pharyngeal- Regular -- Pharyngeal -- Pharyngeal- Multi-consistency -- Pharyngeal -- Pharyngeal- Pill -- Pharyngeal -- Pharyngeal Comment --  CHL IP CERVICAL ESOPHAGEAL PHASE 02/24/2019 Cervical Esophageal Phase WFL Pudding Teaspoon -- Pudding Cup -- Honey Teaspoon -- Honey Cup -- Nectar Teaspoon -- Nectar Cup -- Nectar Straw -- Thin Teaspoon -- Thin Cup -- Thin Straw -- Puree -- Mechanical Soft -- Regular -- Multi-consistency -- Pill -- Cervical Esophageal Comment -- Osie Bond., M.A. [facility] Pager [phone] Office [phone] 02/24/2019, 3:02 PM                   Scheduled Meds: . chlorhexidine gluconate (MEDLINE KIT)  15 mL Mouth Rinse BID  . feeding supplement (ENSURE ENLIVE)  237 mL Oral BID BM  . magnesium oxide  400 mg Oral BID  . mouth rinse  15 mL Mouth Rinse BID  . multivitamin with minerals  1 tablet Oral Daily  . potassium chloride  20 mEq Oral  Daily  . sennosides  15 mL Per Tube BID   Continuous Infusions: . lactated ringers 50 mL/hr at 02/24/19 0639     LOS: 9 days    Time spent:25 minutes    Barb Merino, MD [facility] Pager [phone]

## 2019-02-26 NOTE — Progress Notes (Signed)
Patient admitted from [facility].Patient over dosed on Lexapro and Wellbutrin.Patient pleasant and cooperative during admission assessment. Patient denies SI/HI at this time. Patient denies AVH. Patient informed of fall risk status, fall risk assessed "low" at this time. Patient oriented to unit/staff/room. Patient denies any questions/concerns at this time. Patient safe on unit with Q15 minute checks for safety. Skin assessment and body search done,no contraband found.

## 2019-02-26 NOTE — Social Work (Signed)
CSW set up [employer] pick up- they are here to get pt. Driver in [place] [employer] with sign and requested an additional pt sticker. Pt parents aware and amenable. Pt voluntary form was faxed, original sent with pt, and report has been called.  CSW signing off. Please consult if any additional needs arise.  Doy Hutching, MSW, LCSW [place] Clinical Social Work

## 2019-02-27 DIAGNOSIS — F332 Major depressive disorder, recurrent severe without psychotic features: Principal | ICD-10-CM

## 2019-02-27 MED ORDER — MEMANTINE HCL 5 MG PO TABS
5.0000 mg | ORAL_TABLET | Freq: Every day | ORAL | Status: DC
  Administered 2019-02-27 – 2019-03-01 (×3): 5 mg via ORAL
  Filled 2019-02-27 (×4): qty 1

## 2019-02-27 MED ORDER — OMEGA-3-ACID ETHYL ESTERS 1 G PO CAPS
1.0000 g | ORAL_CAPSULE | Freq: Two times a day (BID) | ORAL | Status: DC
  Administered 2019-02-27 – 2019-03-02 (×6): 1 g via ORAL
  Filled 2019-02-27 (×7): qty 1

## 2019-02-27 MED ORDER — PRAZOSIN HCL 2 MG PO CAPS
4.0000 mg | ORAL_CAPSULE | Freq: Every day | ORAL | Status: DC
  Administered 2019-02-27 – 2019-02-28 (×2): 4 mg via ORAL
  Filled 2019-02-27 (×2): qty 2

## 2019-02-27 MED ORDER — MEMANTINE HCL 5 MG PO TABS: 5.0000 mg | ORAL_TABLET | Freq: Two times a day (BID) | ORAL | Status: DC

## 2019-02-27 MED ORDER — CLONAZEPAM 1 MG PO TABS
1.0000 mg | ORAL_TABLET | Freq: Every day | ORAL | Status: DC
  Administered 2019-02-27 – 2019-02-28 (×2): 1 mg via ORAL
  Filled 2019-02-27 (×2): qty 1

## 2019-02-27 MED ORDER — PRENATAL MULTIVITAMIN CH
1.0000 | ORAL_TABLET | Freq: Every day | ORAL | Status: DC
  Administered 2019-02-27 – 2019-03-02 (×4): 1 via ORAL
  Filled 2019-02-27 (×4): qty 1

## 2019-02-27 NOTE — [facility] Suicide Risk Assessment (Signed)
[facility] Admission Suicide Risk Assessment   Nursing information obtained from:  Patient Demographic factors:  Caucasian Current Mental Status:  NA Loss Factors:  Loss of significant relationship Historical Factors:  Prior suicide attempts Risk Reduction Factors:  Living with another person, especially a relative  Total Time spent with patient: 45 minutes Principal Problem: Status post overdose Diagnosis:  Active Problems:   MDD (major depressive disorder), recurrent severe, without psychosis (HCC)  Subjective Data: Status post overdose with related seizures and cardiac arrest -rule out anoxic/hypoxic injury, treat depression as well, denies current thoughts of harming self or others no recall of overdose  Continued Clinical Symptoms:  Alcohol Use Disorder Identification Test Final Score (AUDIT): 1 The "Alcohol Use Disorders Identification Test", Guidelines for Use in Primary Care, Second Edition.  World Science writer [facility]). Score between 0-7:  no or low risk or alcohol related problems. Score between 8-15:  moderate risk of alcohol related problems. Score between 16-19:  high risk of alcohol related problems. Score 20 or above:  warrants further diagnostic evaluation for alcohol dependence and treatment.   CLINICAL FACTORS:   Depression:   Severe   Musculoskeletal: Strength & Muscle Tone: within normal limits Gait & Station: normal Patient leans: N/A  Psychiatric Specialty Exam: Physical Exam  Nursing note and vitals reviewed. Constitutional: She appears well-developed and well-nourished.  Cardiovascular: Normal rate and regular rhythm.    Review of Systems  Constitutional: Negative.   Eyes: Negative.   Respiratory: Negative.   Cardiovascular: Negative.   Endocrine: Negative.   Genitourinary: Negative.   Neurological: Negative.     Blood pressure 109/78, pulse 88, temperature 98.3 F (36.8 C), temperature source Oral, resp. rate 16, height 5\' 6"  (1.676 m), weight  46.3 kg, last menstrual period 02/24/2019, SpO2 100 %.Body mass index is 16.46 kg/m.  General Appearance: Casual  Eye Contact:  Good  Speech:  Clear and Coherent  Volume:  Normal  Mood:  Anxious and Dysphoric  Affect:  Restricted  Thought Process:  Coherent, Goal Directed and Descriptions of Associations: Intact  Orientation:  Full (Time, Place, and Person)  Thought Content:  Rumination  Suicidal Thoughts:  No  Homicidal Thoughts:  No  Memory:  Immediate;   Poor Recent;   Poor Remote;   Good  3 of 3/1 of 3 as far as remote information that is good as far as recent information no recall of overdose and very little recall from hospitalization at [facility]  Judgement:  Fair  Insight:  Fair  Psychomotor Activity:  Normal  Concentration:  Concentration: Fair and Attention Span: Fair  Recall:  [facility] of Knowledge:  Fair  Language:  Good  Akathisia:  Negative  Handed:  Right  AIMS (if indicated):     Assets:  Communication Skills Desire for Improvement Financial Resources/Insurance Housing Leisure Time Physical Health Resilience Social Support Vocational/Educational  ADL's:  Intact  Cognition:  WNL  Sleep:  Number of Hours: 3    COGNITIVE FEATURES THAT CONTRIBUTE TO RISK:  Polarized thinking    SUICIDE RISK:   Moderate:  Frequent suicidal ideation with limited intensity, and duration, some specificity in terms of plans, no associated intent, good self-control, limited dysphoria/symptomatology, some risk factors present, and identifiable protective factors, including available and accessible social support.  PLAN OF CARE: See eval  I certify that inpatient services furnished can reasonably be expected to improve the patient's condition.   [employer], MD 02/27/2019, 9:08 AM
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
## 2019-02-27 NOTE — Tx Team (Signed)
Interdisciplinary Treatment and Diagnostic Plan Update  02/27/2019 Time of Session: 9:15AM [name] MRN: [id number]  Principal Diagnosis: <principal problem not specified>  Secondary Diagnoses: Active Problems:   MDD (major depressive disorder), recurrent severe, without psychosis (HCC)   Current Medications:  Current Facility-Administered Medications  Medication Dose Route Frequency Provider Last Rate Last Admin  . acetaminophen (TYLENOL) tablet 650 mg  650 mg Oral Q6H PRN Money, Gerlene Burdock, FNP      . alum & mag hydroxide-simeth (MAALOX/MYLANTA) 200-200-20 MG/5ML suspension 30 mL  30 mL Oral Q4H PRN Money, Gerlene Burdock, FNP      . hydrOXYzine (ATARAX/VISTARIL) tablet 25 mg  25 mg Oral TID PRN Money, Gerlene Burdock, FNP      . magnesium hydroxide (MILK OF MAGNESIA) suspension 30 mL  30 mL Oral Daily PRN Money, Gerlene Burdock, FNP      . traZODone (DESYREL) tablet 50 mg  50 mg Oral QHS PRN Money, Gerlene Burdock, FNP   50 mg at 02/26/19 2141   PTA Medications: Medications Prior to Admission  Medication Sig Dispense Refill Last Dose  . magnesium oxide (MAG-OX) 400 (241.3 Mg) MG tablet Take 1 tablet (400 mg total) by mouth 2 (two) times daily.     . Multiple Vitamin (MULTIVITAMIN WITH MINERALS) TABS tablet Take 1 tablet by mouth daily.     . potassium chloride SA (KLOR-CON) 20 MEQ tablet Take 1 tablet (20 mEq total) by mouth daily.       Patient Stressors: Marital or family conflict Traumatic event  Patient Strengths: Average or above average intelligence Communication skills Supportive family/friends  Treatment Modalities: Medication Management, Group therapy, Case management,  1 to 1 session with clinician, Psychoeducation, Recreational therapy.   Physician Treatment Plan for Primary Diagnosis: <principal problem not specified> Long Term Goal(s):     Short Term Goals:    Medication Management: Evaluate patient's response, side effects, and tolerance of medication regimen.  Therapeutic  Interventions: 1 to 1 sessions, Unit Group sessions and Medication administration.  Evaluation of Outcomes: Progressing  Physician Treatment Plan for Secondary Diagnosis: Active Problems:   MDD (major depressive disorder), recurrent severe, without psychosis (HCC)  Long Term Goal(s):     Short Term Goals:       Medication Management: Evaluate patient's response, side effects, and tolerance of medication regimen.  Therapeutic Interventions: 1 to 1 sessions, Unit Group sessions and Medication administration.  Evaluation of Outcomes: Progressing   RN Treatment Plan for Primary Diagnosis: <principal problem not specified> Long Term Goal(s): Knowledge of disease and therapeutic regimen to maintain health will improve  Short Term Goals: Ability to remain free from injury will improve, Ability to verbalize frustration and anger appropriately will improve, Ability to demonstrate self-control, Ability to participate in decision making will improve, Ability to verbalize feelings will improve, Ability to disclose and discuss suicidal ideas, Ability to identify and develop effective coping behaviors will improve and Compliance with prescribed medications will improve  Medication Management: RN will administer medications as ordered by provider, will assess and evaluate patient's response and provide education to patient for prescribed medication. RN will report any adverse and/or side effects to prescribing provider.  Therapeutic Interventions: 1 on 1 counseling sessions, Psychoeducation, Medication administration, Evaluate responses to treatment, Monitor vital signs and CBGs as ordered, Perform/monitor CIWA, COWS, AIMS and Fall Risk screenings as ordered, Perform wound care treatments as ordered.  Evaluation of Outcomes: Progressing   LCSW Treatment Plan for Primary Diagnosis: <principal problem not specified> Long Term  Goal(s): Safe transition to appropriate next level of care at discharge,  Engage patient in therapeutic group addressing interpersonal concerns.  Short Term Goals: Engage patient in aftercare planning with referrals and resources, Increase social support, Increase ability to appropriately verbalize feelings, Increase emotional regulation, Facilitate acceptance of mental health diagnosis and concerns, Facilitate patient progression through stages of change regarding substance use diagnoses and concerns, Identify triggers associated with mental health/substance abuse issues and Increase skills for wellness and recovery  Therapeutic Interventions: Assess for all discharge needs, 1 to 1 time with Social worker, Explore available resources and support systems, Assess for adequacy in community support network, Educate family and significant other(s) on suicide prevention, Complete Psychosocial Assessment, Interpersonal group therapy.  Evaluation of Outcomes: Progressing   Progress in Treatment: Attending groups: Yes. Participating in groups: Yes. Taking medication as prescribed: Yes. Toleration medication: Yes. Family/Significant other contact made: No, will contact:  Patient will provide consent. Patient understands diagnosis: Yes. Discussing patient identified problems/goals with staff: Yes. Medical problems stabilized or resolved: Yes. Denies suicidal/homicidal ideation: Patient able to contract for safety on unit.  Issues/concerns per patient self-inventory: Yes. Other: NA  New problem(s) identified: No, Describe:  None  New Short Term/Long Term Goal(s):  Engage patient in therapeutic treatment addressing interpersonal concerns, safe transition to appropriate level of care at discharge.  Patient Goals:  "work on getting my memory back right and residential treatment"  Discharge Plan or Barriers: Patient would like to work on  residential treatment for after discharge.  Reason for Continuation of Hospitalization: Depression Suicidal ideation  Estimated Length  of Stay:  3-5 days  Attendees: Patient:  Molly Callahan 02/27/2019 9:04 AM  Physician: Johnn Hai, MD 02/27/2019 9:04 AM  Nursing: Polly Cobia, RN 02/27/2019 9:04 AM  RN Care Manager: 02/27/2019 9:04 AM  Social Worker: Netta Neat, LCSW 02/27/2019 9:04 AM  Recreational Therapist:  02/27/2019 9:04 AM  Other:  02/27/2019 9:04 AM  Other:  02/27/2019 9:04 AM  Other: 02/27/2019 9:04 AM    Scribe for Treatment Team: Netta Neat, MSW, LCSW Clinical Social Work 02/27/2019 9:04 AM

## 2019-02-27 NOTE — [facility] Group Notes (Signed)
[facility] LCSW Group Therapy Note  Date/Time:  02/27/2019  1:00PM  Type of Therapy and Topic:  Group Therapy:  Healthy and Unhealthy Supports  Participation Level:  Active   Description of Group:  Patients in this group were introduced to the idea of adding a variety of healthy supports to address the various needs in their lives.Patients discussed what additional healthy supports could be helpful in their recovery and wellness after discharge in order to prevent future hospitalizations.   An emphasis was placed on using counselor, doctor, therapy groups, 12-step groups, and problem-specific support groups to expand supports.  They also worked as a group on developing a specific plan for several patients to deal with unhealthy supports through boundary-setting, psychoeducation with loved ones, and even termination of relationships.   Therapeutic Goals:   1)  discuss importance of adding supports to stay well once out of the hospital  2)  compare healthy versus unhealthy supports and identify some examples of each  3)  generate ideas and descriptions of healthy supports that can be added  4)  offer mutual support about how to address unhealthy supports  5)  encourage active participation in and adherence to discharge plan    Summary of Patient Progress:  The patient stated that current healthy supports in her life are journalling and listening to music while she did not discuss current unhealthy supports.  The patient expressed a willingness to add meditating as support(s) to help in her recovery journey.   Therapeutic Modalities:   Motivational Interviewing Brief Solution-Focused Therapy    Roselyn Bering, MSW, LCSW Clinical Social Work

## 2019-02-27 NOTE — H&P (Signed)
Psychiatric Admission Assessment Adult  Patient Identification: Molly Callahan MRN:  [id number] Date of Evaluation:  02/27/2019 Chief Complaint:  MDD (major depressive disorder), recurrent severe, without psychosis (HCC) [F33.2] Principal Diagnosis: Status post overdose/memory deficits Diagnosis:  Active Problems:   MDD (major depressive disorder), recurrent severe, without psychosis (HCC)  History of Present Illness:   This is the first psychiatric admission for [facility], she is 19 years of age she is single, she came to our attention after initial stabilization at [facility].  The patient had overdosed on Wellbutrin and Lexapro, suffering seizure activity, requiring intubation in the emergency department, followed by QTC prolongation and VT arrest.  She required defibrillation x1 and required CPR approximately 3 minutes.  The patient has a chronic history of depression and anxiety which necessitated the prescriptions for Lexapro and Wellbutrin, she is also been treated with sertraline in the past.  The most recent stressor was a break-up with a boyfriend, the patient herself has no recall of her overdose and has short-term memory issues at this point in time.  She was also thought to have serotonin syndrome for a period of time due to the overdose.  But she has struggled with depression for approximately 3 to 4 years.  Her mother provided further history.  The patient herself is alert oriented to person place time situation not date.  Has short-term memory deficits as discussed, has no recall of the overdose just remembers "waking up in the hospital" she would not elaborate on her stressors.  She was dreaming about her boyfriend and this was upsetting to her. She has never tried to hurt herself before.  She denied substance abuse but her drug screen did show cannabis mother reports that that is not a dependency. Associated Signs/Symptoms: Depression Symptoms:  anhedonia, impaired  memory, (Hypo) Manic Symptoms:  No evidence of mania Anxiety Symptoms:  Not excessively anxious at this point in time Psychotic Symptoms:  No evidence of psychosis PTSD Symptoms: NA Total Time spent with patient: 45 minutes  Past Psychiatric History: Prior treatment with sertraline, only outpatient therapy for depression  Is the patient at risk to self? Yes.    Has the patient been a risk to self in the past 6 months? No.  Has the patient been a risk to self within the distant past? No.  Is the patient a risk to others? No.  Has the patient been a risk to others in the past 6 months? No.  Has the patient been a risk to others within the distant past? No.   Prior Inpatient Therapy:  Negative Prior Outpatient Therapy:  Prior outpatient trials of antidepressants  Alcohol Screening: 1. How often do you have a drink containing alcohol?: Monthly or less 2. How many drinks containing alcohol do you have on a typical day when you are drinking?: 1 or 2 3. How often do you have six or more drinks on one occasion?: Never AUDIT-C Score: 1 4. How often during the last year have you found that you were not able to stop drinking once you had started?: Never 5. How often during the last year have you failed to do what was normally expected from you becasue of drinking?: Never 6. How often during the last year have you needed a first drink in the morning to get yourself going after a heavy drinking session?: Never 7. How often during the last year have you had a feeling of guilt of remorse after drinking?: Never 8. How often during  the last year have you been unable to remember what happened the night before because you had been drinking?: Never 9. Have you or someone else been injured as a result of your drinking?: No 10. Has a relative or friend or a doctor or another health worker been concerned about your drinking or suggested you cut down?: No Alcohol Use Disorder Identification Test Final Score  (AUDIT): 1 Alcohol Brief Interventions/Follow-up: AUDIT Score <7 follow-up not indicated Substance Abuse History in the last 12 months:  Yes.   Consequences of Substance Abuse: NA Previous Psychotropic Medications: Yes  Psychological Evaluations: No  Past Medical History:  Past Medical History:  Diagnosis Date  . Anxiety   . 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 Depression   . Suicidal ideation 03/19/2016    Past Surgical History:  Procedure Laterality Date  . NO PAST SURGERIES     Family History: History reviewed. No pertinent family history. Family Psychiatric  History: Patient unaware Tobacco Screening: Have you used any form of tobacco in the last 30 days? (Cigarettes, Smokeless Tobacco, Cigars, and/or Pipes): Yes Tobacco use, Select all that apply: 4 or less cigarettes per day Are you interested in Tobacco Cessation Medications?: Yes, will notify MD for an order Counseled patient on smoking cessation including recognizing danger situations, developing coping skills and basic information about quitting provided: Yes Social History:  Social History   Substance and Sexual Activity  Alcohol Use No     Social History   Substance and Sexual Activity  Drug Use No    Additional Social History:                           Allergies:   Allergies  Allergen Reactions  . Pollen Extract Other (See Comments)    Teary eyes   Lab Results: No results found for this or any previous visit (from the past 48 hour(s)).  Blood Alcohol level:  Lab Results  Component Value Date   ETH <10 02/17/2019   ETH <5 06/21/2016    Metabolic Disorder Labs:  Lab Results  Component Value Date   HGBA1C 4.8 10/22/2016   MPG 91.06 10/22/2016   MPG 100 03/19/2016   No results found for: PROLACTIN Lab Results  Component Value Date   CHOL 157 10/22/2016   TRIG 57 10/22/2016   HDL 46 10/22/2016   CHOLHDL 3.4 10/22/2016   VLDL 11 10/22/2016   LDLCALC 100 (H) 10/22/2016    LDLCALC 80 03/19/2016    Current Medications: Current Facility-Administered Medications  Medication Dose Route Frequency Provider Last Rate Last Admin  . acetaminophen (TYLENOL) tablet 650 mg  650 mg Oral Q6H PRN Money, Gerlene Burdock, FNP      . alum & mag hydroxide-simeth (MAALOX/MYLANTA) 200-200-20 MG/5ML suspension 30 mL  30 mL Oral Q4H PRN Money, Gerlene Burdock, FNP      . hydrOXYzine (ATARAX/VISTARIL) tablet 25 mg  25 mg Oral TID PRN Money, Gerlene Burdock, FNP      . magnesium hydroxide (MILK OF MAGNESIA) suspension 30 mL  30 mL Oral Daily PRN Money, Gerlene Burdock, FNP      . traZODone (DESYREL) tablet 50 mg  50 mg Oral QHS PRN Money, Gerlene Burdock, FNP   50 mg at 02/26/19 2141   PTA Medications: Medications Prior to Admission  Medication Sig Dispense Refill Last Dose  . magnesium oxide (MAG-OX) 400 (241.3 Mg) MG tablet Take 1 tablet (400 mg total) by mouth 2 (  two) times daily.     . Multiple Vitamin (MULTIVITAMIN WITH MINERALS) TABS tablet Take 1 tablet by mouth daily.     . potassium chloride SA (KLOR-CON) 20 MEQ tablet Take 1 tablet (20 mEq total) by mouth daily.       Musculoskeletal: Strength & Muscle Tone: within normal limits Gait & Station: normal Patient leans: N/A  Psychiatric Specialty Exam: Physical Exam  Nursing note and vitals reviewed. Constitutional: She appears well-developed and well-nourished.  Cardiovascular: Normal rate and regular rhythm.    Review of Systems  Constitutional: Negative.   Eyes: Negative.   Respiratory: Negative.   Cardiovascular: Negative.   Endocrine: Negative.   Genitourinary: Negative.   Neurological: Negative.     Blood pressure 109/78, pulse 88, temperature 98.3 F (36.8 C), temperature source Oral, resp. rate 16, height 5\' 6"  (1.676 m), weight 46.3 kg, last menstrual period 02/24/2019, SpO2 100 %.Body mass index is 16.46 kg/m.  General Appearance: Casual  Eye Contact:  Good  Speech:  Clear and Coherent  Volume:  Normal  Mood:  Anxious and Dysphoric   Affect:  Restricted  Thought Process:  Coherent, Goal Directed and Descriptions of Associations: Intact  Orientation:  Full (Time, Place, and Person)  Thought Content:  Rumination  Suicidal Thoughts:  No  Homicidal Thoughts:  No  Memory:  Immediate;   Poor Recent;   Poor Remote;   Good  3 of 3/1 of 3 as far as remote information that is good as far as recent information no recall of overdose and very little recall from hospitalization at [place]:  Fair  Insight:  Fair  Psychomotor Activity:  Normal  Concentration:  Concentration: Fair and Attention Span: Fair  Recall:  [employer] of Knowledge:  Fair  Language:  Good  Akathisia:  Negative  Handed:  Right  AIMS (if indicated):     Assets:  Communication Skills Desire for Improvement Financial Resources/Insurance Housing Leisure Time Physical Health Resilience Social Support Vocational/Educational  ADL's:  Intact  Cognition:  WNL  Sleep:  Number of Hours: 3    Treatment Plan Summary: Daily contact with patient to assess and evaluate symptoms and progress in treatment and Medication management  Observation Level/Precautions:  15 minute checks  Laboratory:  UDS  Psychotherapy: Cognitive/supportive  Medications: B vitamins/omega-3's  Consultations:    Discharge Concerns: Long-term safety recovery of memory  Estimated LOS: 3-5  Other:     Physician Treatment Plan for Primary Diagnosis:   Axis I-depression recurrent severe without psychosis Status post overdose Axis II deferred Axis III rule out anoxic/hypoxic injury  Mother and patient are hesitant to begin new [place] given recent diagnosis of serotonin syndrome post overdose we will begin with metabolize B vitamins that are found in prenatals For memory deficits/potential hypoxic anoxic injury begin omega-3/Namenda/B vitamins  Long Term Goal(s): Improvement in symptoms so as ready for discharge  Short Term Goals: Ability to identify and develop effective  coping behaviors will improve, Ability to maintain clinical measurements within normal limits will improve and Compliance with prescribed medications will improve  Physician Treatment Plan for Secondary Diagnosis: Active Problems:   MDD (major depressive disorder), recurrent severe, without psychosis ([place])  Long Term Goal(s): Improvement in symptoms so as ready for discharge  Short Term Goals: Ability to maintain clinical measurements within normal limits will improve and Compliance with prescribed medications will improve  I certify that inpatient services furnished can reasonably be expected to improve the patient's condition.  Malvin Johns, MD 2/6/20219:00 AM

## 2019-02-27 NOTE — Progress Notes (Signed)
Patient was in the day room upon arrival to the unit. Patient presents with a depressed affect but was pleasant during assessment. Patient denies SI/HI/AVH, pain and anxiety. Patient endorses depression rating it 7/10 with this Clinical research associate. Patient given education. Patient given support and encouragement to be active in her treatment plan. Patient being monitored Q 15 minutes for safety per unit protocol. Patient remains safe on the unit. Patient observed interacting appropriately with staff and peers on the unit by this Clinical research associate.

## 2019-02-27 NOTE — Plan of Care (Signed)
Patient new to the unit this evening   Problem: Education: Goal: Knowledge of [place] [facility] Education information/materials will improve Outcome: Not Progressing Goal: Emotional status will improve Outcome: Not Progressing Goal: Verbalization of understanding the information provided will improve Outcome: Not Progressing   Problem: Safety: Goal: Periods of time without injury will increase Outcome: Not Progressing   Problem: Education: Goal: Utilization of techniques to improve thought processes will improve Outcome: Not Progressing Goal: Knowledge of the prescribed therapeutic regimen will improve Outcome: Not Progressing   Problem: Coping: Goal: Coping ability will improve Outcome: Not Progressing Goal: Will verbalize feelings Outcome: Not Progressing

## 2019-02-27 NOTE — Progress Notes (Signed)
D: Major Depression   A:Patient stated slept poor last night .Stated appetite good and energy level   normal. Stated concentration good . Stated on Depression scale 5, hopeless 2 and anxiety 4 .( low 0-10 high) Denies suicidal  homicidal ideations .No auditory hallucinations  No pain concerns . Appropriate ADL'S. Interacting with peers and staff.  Encourage patient participation with unit programming . Instruction  Given on  Medication , verbalize understanding.Patient knowledgeable of [place] education, able to verbalize  understanding of information received . Emotional and mental status  improved.  Voice no concerns around  safety. Attending unit therapy groups , verbalization feeling expressed. Informed of medications given . Working on coping, decision making and anxiety  Attending unit programing.   R: Voice no other concerns. Staff continue to monitor

## 2019-02-27 NOTE — Plan of Care (Signed)
Patient knowledgeable of [place] education, able to verbalize  understanding of information received . Emotional and mental status  improved.  Voice no concerns around  safety. Attending unit therapy groups , verbalization feeling expressed. Informed of medications given . Working on coping, decision making and anxiety . Problem: Self-Concept: Goal: Level of anxiety will decrease Outcome: Progressing   Problem: Health Behavior/Discharge Planning: Goal: Ability to make decisions will improve Outcome: Progressing   Problem: Coping: Goal: Coping ability will improve Outcome: Progressing   Problem: Coping: Goal: Will verbalize feelings Outcome: Progressing   Problem: Education: Goal: Knowledge of the prescribed therapeutic regimen will improve Outcome: Progressing   Problem: Safety: Goal: Periods of time without injury will increase Outcome: Progressing   Problem: Education: Goal: Knowledge of [place] [facility] Education information/materials will improve Outcome: Progressing Goal: Emotional status will improve Outcome: Progressing Goal: Verbalization of understanding the information provided will improve Outcome: Progressing

## 2019-02-28 NOTE — B[facility] Group Notes (Signed)
LCSW Group Therapy Notes   Date and Time: 02/28/2019 1:00pm  Type of Therapy and Topic: Group Therapy: Worry and Anxiety  Participation Level: [facility] PARTICIPATION LEVEL: Active  Description of Group: In this group, patients will be encouraged to explore their worry around what could happen vs what will happen. Each patient will be challenged to think of personal worries and how they will work their way through that worry around what will happen and what could happen. This group will be process-oriented, with patients participating in exploration of their own experiences as well as giving and receiving support and challenge from other group members.  Therapeutic Goals: 1. Patient will identify personal worries that cause anxiety. 2. Patient will identify clues to identify their worry. 3. Patient will identify ways to handle their worry. 4. Patient will discuss ways that their worry has deceased or why it has not decreased.   Summary of Patient Progress:  Pt described her current feelings as  "better. I got some sleep last night". Pt identified a personal worry with the group about how her loved ones may react to her diagnosis. She also identified ways to handle the worry. Pt stated was able to recognize decreased worry after participating in the activity.  Therapeutic Modalities:  Cognitive Behavioral Therapy Solution Focused Therapy Motivational Interviewing    Teresita Madura, MSW, [employer] Clinical Social Worker

## 2019-02-28 NOTE — Plan of Care (Signed)
Patient stayed in the dayroom and participated in evening activities. Remained cooperative and appropriate toward staff and peers.. presented to the medication room and discussed about her depression saying "I don't know why and how I overdosed...". reported that depression runs in family maternal grandfather has depression.  Patient is motivated for treatment. Received medication and was encouraged to discuss her feelings as needed. Currently in bed resting. Safety monitored as expected.

## 2019-02-28 NOTE — Progress Notes (Signed)
[facility] MD Progress Note  02/28/2019 9:29 AM CARROL HOUGLAND  MRN:  [id number] Subjective:   Summary of HPI This is the first psychiatric admission for Molly Callahan, she is 19 years of age she is single, she came to our attention after initial stabilization at [facility]. The patient had overdosed on Wellbutrin and Lexapro, suffering seizure activity, requiring intubation in the emergency department, followed by QTC prolongation and VT arrest.  She required defibrillation x1 and required CPR approximately 3 minutes. The patient has a chronic history of depression and anxiety which necessitated the prescriptions for Lexapro and Wellbutrin, she is also been treated with sertraline in the past.  The most recent stressor was a break-up with a boyfriend, the patient herself has no recall of her overdose and has short-term memory issues at this point in time.  She was also thought to have serotonin syndrome for a period of time due to the overdose.  But she has struggled with depression for approximately 3 to 4 years  Patient reports overall improvement not only in her mood but and her memory can repeat 3 of 3 and 2 of 3 and 3 of 3 with prompts Still no recall of events prior to overdose simply waking up in the hospital Denies feeling physically depressed denies wanting to harm self or others at this point in time. Mood somewhat better still hesitant to reinstitute SRI but wants to stay with B vitamins.  Discussed EnBrace HR therapy  Principal Problem: Recurrent depression status post overdose Diagnosis: Active Problems:   MDD (major depressive disorder), recurrent severe, without psychosis (HCC)  Total Time spent with patient: 20 minutes  Past Psychiatric History: Chronic depression despite youth  Past Medical History:  Past Medical History:  Diagnosis Date  . Anxiety   . 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 Depression   . Suicidal ideation 03/19/2016    Past Surgical History:  Procedure  Laterality Date  . NO PAST SURGERIES     Family History: History reviewed. No pertinent family history. Family Psychiatric  History: See eval Social History:  Social History   Substance and Sexual Activity  Alcohol Use No     Social History   Substance and Sexual Activity  Drug Use No    Social History   Socioeconomic History  . Marital status: Single    Spouse name: Not on file  . Number of children: Not on file  . Years of education: Not on file  . Highest education level: Not on file  Occupational History  . Not on file  Tobacco Use  . Smoking status: Current Some Day Smoker  . Smokeless tobacco: Never Used  Substance and Sexual Activity  . Alcohol use: No  . Drug use: No  . Sexual activity: Yes    Birth control/protection: Condom  Other Topics Concern  . Not on file  Social History Narrative  . Not on file   Social Determinants of Health   Financial Resource Strain:   . Difficulty of Paying Living Expenses: Not on file  Food Insecurity:   . Worried About Programme researcher, broadcasting/film/video in the Last Year: Not on file  . Ran Out of Food in the Last Year: Not on file  Transportation Needs:   . Lack of Transportation (Medical): Not on file  . Lack of Transportation (Non-Medical): Not on file  Physical Activity:   . Days of Exercise per Week: Not on file  . Minutes of Exercise per Session: Not on  file  Stress:   . Feeling of Stress : Not on file  Social Connections:   . Frequency of Communication with Friends and Family: Not on file  . Frequency of Social Gatherings with Friends and Family: Not on file  . Attends Religious Services: Not on file  . Active Member of Clubs or Organizations: Not on file  . Attends Banker Meetings: Not on file  . Marital Status: Not on file   Additional Social History:                         Sleep: poor  Appetite:  Fair  Current Medications: Current Facility-Administered Medications  Medication Dose Route  Frequency Provider Last Rate Last Admin  . acetaminophen (TYLENOL) tablet 650 mg  650 mg Oral Q6H PRN Money, Gerlene Burdock, FNP      . alum & mag hydroxide-simeth (MAALOX/MYLANTA) 200-200-20 MG/5ML suspension 30 mL  30 mL Oral Q4H PRN Money, Gerlene Burdock, FNP      . clonazePAM (KLONOPIN) tablet 1 mg  1 mg Oral QHS Malvin Johns, MD   1 mg at 02/27/19 2245  . hydrOXYzine (ATARAX/VISTARIL) tablet 25 mg  25 mg Oral TID PRN Money, Gerlene Burdock, FNP      . magnesium hydroxide (MILK OF MAGNESIA) suspension 30 mL  30 mL Oral Daily PRN Money, Gerlene Burdock, FNP      . memantine (NAMENDA) tablet 5 mg  5 mg Oral Daily Malvin Johns, MD   5 mg at 02/28/19 0908  . omega-3 acid ethyl esters (LOVAZA) capsule 1 g  1 g Oral BID Malvin Johns, MD   1 g at 02/28/19 0908  . prazosin (MINIPRESS) capsule 4 mg  4 mg Oral QHS Malvin Johns, MD   4 mg at 02/27/19 2244  . prenatal multivitamin tablet 1 tablet  1 tablet Oral Q1200 Malvin Johns, MD   1 tablet at 02/27/19 1127  . traZODone (DESYREL) tablet 50 mg  50 mg Oral QHS PRN Money, Gerlene Burdock, FNP   50 mg at 02/26/19 2141    Lab Results: No results found for this or any previous visit (from the past 48 hour(s)).  Blood Alcohol level:  Lab Results  Component Value Date   ETH <10 02/17/2019   ETH <5 06/21/2016    Metabolic Disorder Labs: Lab Results  Component Value Date   HGBA1C 4.8 10/22/2016   MPG 91.06 10/22/2016   MPG 100 03/19/2016   No results found for: PROLACTIN Lab Results  Component Value Date   CHOL 157 10/22/2016   TRIG 57 10/22/2016   HDL 46 10/22/2016   CHOLHDL 3.4 10/22/2016   VLDL 11 10/22/2016   LDLCALC 100 (H) 10/22/2016   LDLCALC 80 03/19/2016     Musculoskeletal: Strength & Muscle Tone: within normal limits Gait & Station: normal Patient leans: N/A  Psychiatric Specialty Exam: Physical Exam  Review of Systems  Blood pressure 110/72, pulse (!) 134, temperature 98.6 F (37 C), temperature source Oral, resp. rate 18, height 5\' 6"  (1.676 m),  weight 46.3 kg, last menstrual period 02/24/2019, SpO2 97 %.Body mass index is 16.46 kg/m.  General Appearance: Casual  Eye Contact:  Good  Speech:  Clear and Coherent  Volume:  Normal  Mood:  Dysphoric  Affect:  Congruent  Thought Process:  Coherent and Goal Directed  Orientation:  Full (Time, Place, and Person)  Thought Content:  Logical  Suicidal Thoughts:  No  Homicidal Thoughts:  No  Memory:  Immediate;   improved Recent;   no change Remote;   stable  Judgement:  Intact  Insight:  Good  Psychomotor Activity:  Normal  Concentration:  Concentration: Good and Attention Span: Good  Recall:  Good  Fund of Knowledge:  Good  Language:  Good  Akathisia:  Negative  Handed:  Right  AIMS (if indicated):     Assets:  Resilience  ADL's:  Intact  Cognition:  WNL  Sleep:  Number of Hours: 3   Treatment Plan Summary: Daily contact with patient to assess and evaluate symptoms and progress in treatment and Medication management  Continue cognitive and B vitamin therapy for depression, replace the prenatal with EnBrace HR at the point of discharge Continue to enhance memory with B vitamins Namenda, omega-3's, so forth no change in precautions  Perez Dirico, MD 02/28/2019, 9:29 AM

## 2019-02-28 NOTE — Plan of Care (Signed)
D- Patient alert and oriented. Patient presents in a pleasant mood on assessment stating that she slept "well, better" last night and had some complaints of throat pain, but did not request any pain medication from this Clinical research associate. Patient endorsed some depression and anxiety, stating that "being away from my family" and "not really knowing what's going to happen", is making her feel this way. Patient denies SI, HI, AVH, at this time. Patient's goal for today is to "talk to social worker/family about plans after".  A- Scheduled medications administered to patient, per MD orders. Support and encouragement provided.  Routine safety checks conducted every 15 minutes.  Patient informed to notify staff with problems or concerns.  R- No adverse drug reactions noted. Patient contracts for safety at this time. Patient compliant with medications and treatment plan. Patient receptive, calm, and cooperative. Patient interacts well with others on the unit.  Patient remains safe at this time.  Problem: Education: Goal: Knowledge of [facility] Education information/materials will improve Outcome: Progressing Goal: Emotional status will improve Outcome: Progressing Goal: Verbalization of understanding the information provided will improve Outcome: Progressing   Problem: Safety: Goal: Periods of time without injury will increase Outcome: Progressing   Problem: Education: Goal: Utilization of techniques to improve thought processes will improve Outcome: Progressing Goal: Knowledge of the prescribed therapeutic regimen will improve Outcome: Progressing   Problem: Coping: Goal: Coping ability will improve Outcome: Progressing Goal: Will verbalize feelings Outcome: Progressing   Problem: Health Behavior/Discharge Planning: Goal: Ability to make decisions will improve Outcome: Progressing Goal: Compliance with therapeutic regimen will improve Outcome: Progressing   Problem: Self-Concept: Goal:  Will verbalize positive feelings about self Outcome: Progressing Goal: Level of anxiety will decrease Outcome: Progressing

## 2019-03-01 MED ORDER — PRAZOSIN HCL 2 MG PO CAPS
2.0000 mg | ORAL_CAPSULE | Freq: Every day | ORAL | Status: DC
  Administered 2019-03-01: 2 mg via ORAL
  Filled 2019-03-01: qty 1

## 2019-03-01 NOTE — Progress Notes (Signed)
CSW provided pt with number for [facility].   Iris Pert, MSW, LCSW Clinical Social Work 03/01/2019 2:37 PM

## 2019-03-01 NOTE — Plan of Care (Signed)
D- Patient alert and oriented. Patient presents in a pleasant mood on assessment stating that she slept better last night and had no complaints to voice to this writer at this time. Patient did report having some depression last night from missing her family, however, she stated that it's "better today". Patient also reported anxiety, stating that she is "worrying about plans after here". Patient denies SI, HI, AVH, and pain at this time. Patient had no stated goals for today.  A- Scheduled medications administered to patient, per MD orders. Support and encouragement provided.  Routine safety checks conducted every 15 minutes.  Patient informed to notify staff with problems or concerns.  R- No adverse drug reactions noted. Patient contracts for safety at this time. Patient compliant with medications and treatment plan. Patient receptive, calm, and cooperative. Patient interacts well with others on the unit.  Patient remains safe at this time.  Problem: Education: Goal: Knowledge of [facility] Education information/materials will improve Outcome: Progressing Goal: Emotional status will improve Outcome: Progressing Goal: Verbalization of understanding the information provided will improve Outcome: Progressing   Problem: Safety: Goal: Periods of time without injury will increase Outcome: Progressing   Problem: Education: Goal: Utilization of techniques to improve thought processes will improve Outcome: Progressing Goal: Knowledge of the prescribed therapeutic regimen will improve Outcome: Progressing   Problem: Coping: Goal: Coping ability will improve Outcome: Progressing Goal: Will verbalize feelings Outcome: Progressing   Problem: Health Behavior/Discharge Planning: Goal: Ability to make decisions will improve Outcome: Progressing Goal: Compliance with therapeutic regimen will improve Outcome: Progressing   Problem: Self-Concept: Goal: Will verbalize positive feelings  about self Outcome: Progressing Goal: Level of anxiety will decrease Outcome: Progressing

## 2019-03-01 NOTE — Progress Notes (Signed)
Assumed care at 1100 patient in no distress.

## 2019-03-01 NOTE — [facility] Suicide Risk Assessment (Signed)
[facility] INPATIENT:  Family/Significant Other Suicide Prevention Education  Suicide Prevention Education:  Contact Attempts: [name], mother [phone] has been identified by the patient as the family member/significant other with whom the patient will be residing, and identified as the person(s) who will aid the patient in the event of a mental health crisis.  With written consent from the patient, two attempts were made to provide suicide prevention education, prior to and/or following the patient's discharge.  We were unsuccessful in providing suicide prevention education.  A suicide education pamphlet was given to the patient to share with family/significant other.  Date and time of first attempt: 03/01/19 at 1:58pm Date and time of second attempt:  CSW left a HIPAA compliant voicemail requesting a call back.  Charlann Lange Kuper Rennels MSW LCSW 03/01/2019, 1:58 PM

## 2019-03-01 NOTE — Progress Notes (Signed)
[facility] MD Progress Note  03/01/2019 2:17 PM Molly Callahan  MRN:  [id number] Subjective: Patient seen and chart reviewed.  19 year old woman with a history of depression presented to the hospital in [place] with seizures apparently the result of an overdose of bupropion taken along with Lexapro..  Patient's course was complicated by cardiac arrest requiring electrical cardioversion.  On interview today the patient says she has no memory of taking the overdose and no memory of the cardioversion.  She just remembers waking up in the intensive care unit.  She acknowledges chronic life stresses but says that she does not remember having had suicidal ideation.  He does recall that she had been having a lot of stress with her ex-boyfriend.  On interview today she denies having suicidal thoughts.  Admits to having chronic depression but says she is feeling reasonably good today.  Denies psychotic symptoms.  Patient has not been placed back on any psychiatric medicine at this point. Principal Problem: MDD (major depressive disorder), recurrent severe, without psychosis ([place] Diagnosis: Principal Problem:   MDD (major depressive disorder), recurrent severe, without psychosis ([place]) Active Problems:   Suicidal ideation   Anxiety   Overdose  Total Time spent with patient: 30 minutes  Past Psychiatric History: Patient has a past history of self injury and suicidal thoughts.  Positive past history of psychiatric admissions.  Past Medical History:  Past Medical History:  Diagnosis Date  . Anxiety   . 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 Depression   . Suicidal ideation 03/19/2016    Past Surgical History:  Procedure Laterality Date  . NO PAST SURGERIES     Family History: History reviewed. No pertinent family history. Family Psychiatric  History: Not aware of any Social History:  Social History   Substance and Sexual Activity  Alcohol Use No     Social History   Substance and Sexual  Activity  Drug Use No    Social History   Socioeconomic History  . Marital status: Single    Spouse name: Not on file  . Number of children: Not on file  . Years of education: Not on file  . Highest education level: Not on file  Occupational History  . Not on file  Tobacco Use  . Smoking status: Current Some Day Smoker  . Smokeless tobacco: Never Used  Substance and Sexual Activity  . Alcohol use: No  . Drug use: No  . Sexual activity: Yes    Birth control/protection: Condom  Other Topics Concern  . Not on file  Social History Narrative  . Not on file   Social Determinants of Health   Financial Resource Strain:   . Difficulty of Paying Living Expenses: Not on file  Food Insecurity:   . Worried About Charity fundraiser in the Last Year: Not on file  . Ran Out of Food in the Last Year: Not on file  Transportation Needs:   . Lack of Transportation (Medical): Not on file  . Lack of Transportation (Non-Medical): Not on file  Physical Activity:   . Days of Exercise per Week: Not on file  . Minutes of Exercise per Session: Not on file  Stress:   . Feeling of Stress : Not on file  Social Connections:   . Frequency of Communication with Friends and Family: Not on file  . Frequency of Social Gatherings with Friends and Family: Not on file  . Attends Religious Services: Not on file  . Active Member of  Clubs or Organizations: Not on file  . Attends Banker Meetings: Not on file  . Marital Status: Not on file   Additional Social History:                         Sleep: Fair  Appetite:  Fair  Current Medications: Current Facility-Administered Medications  Medication Dose Route Frequency Provider Last Rate Last Admin  . acetaminophen (TYLENOL) tablet 650 mg  650 mg Oral Q6H PRN Money, Gerlene Burdock, FNP      . alum & mag hydroxide-simeth (MAALOX/MYLANTA) 200-200-20 MG/5ML suspension 30 mL  30 mL Oral Q4H PRN Money, Gerlene Burdock, FNP      . hydrOXYzine  (ATARAX/VISTARIL) tablet 25 mg  25 mg Oral TID PRN Money, Gerlene Burdock, FNP      . magnesium hydroxide (MILK OF MAGNESIA) suspension 30 mL  30 mL Oral Daily PRN Money, Gerlene Burdock, FNP      . omega-3 acid ethyl esters (LOVAZA) capsule 1 g  1 g Oral BID Malvin Johns, MD   1 g at 03/01/19 7517  . prazosin (MINIPRESS) capsule 2 mg  2 mg Oral QHS Kailen Hinkle T, MD      . prenatal multivitamin tablet 1 tablet  1 tablet Oral Q1200 Malvin Johns, MD   1 tablet at 03/01/19 1150  . traZODone (DESYREL) tablet 50 mg  50 mg Oral QHS PRN Money, Gerlene Burdock, FNP   50 mg at 02/26/19 2141    Lab Results: No results found for this or any previous visit (from the past 48 hour(s)).  Blood Alcohol level:  Lab Results  Component Value Date   ETH <10 02/17/2019   ETH <5 06/21/2016    Metabolic Disorder Labs: Lab Results  Component Value Date   HGBA1C 4.8 10/22/2016   MPG 91.06 10/22/2016   MPG 100 03/19/2016   No results found for: PROLACTIN Lab Results  Component Value Date   CHOL 157 10/22/2016   TRIG 57 10/22/2016   HDL 46 10/22/2016   CHOLHDL 3.4 10/22/2016   VLDL 11 10/22/2016   LDLCALC 100 (H) 10/22/2016   LDLCALC 80 03/19/2016    Physical Findings: AIMS:  , ,  ,  ,    CIWA:    COWS:     Musculoskeletal: Strength & Muscle Tone: within normal limits Gait & Station: normal Patient leans: N/A  Psychiatric Specialty Exam: Physical Exam  Nursing note and vitals reviewed. Constitutional: She appears well-developed and well-nourished.  HENT:  Head: Normocephalic and atraumatic.  Eyes: Pupils are equal, round, and reactive to light. Conjunctivae are normal.  Cardiovascular: Regular rhythm and normal heart sounds.  Respiratory: Effort normal.  GI: Soft.  Musculoskeletal:        General: Normal range of motion.     Cervical back: Normal range of motion.  Neurological: She is alert.  Skin: Skin is warm and dry.  Psychiatric: Her speech is normal and behavior is normal. Judgment and thought  content normal. Cognition and memory are normal.    Review of Systems  Constitutional: Negative.   HENT: Negative.   Eyes: Negative.   Respiratory: Negative.   Cardiovascular: Negative.   Gastrointestinal: Negative.   Musculoskeletal: Negative.   Skin: Negative.   Neurological: Negative.   Psychiatric/Behavioral: The patient is nervous/anxious.     Blood pressure 109/75, pulse (!) 116, temperature 98.2 F (36.8 C), temperature source Oral, resp. rate 18, height 5\' 6"  (1.676 m), weight 46.3  kg, last menstrual period 02/24/2019, SpO2 100 %.Body mass index is 16.46 kg/m.  General Appearance: Casual  Eye Contact:  Fair  Speech:  Clear and Coherent  Volume:  Normal  Mood:  Euthymic  Affect:  Constricted  Thought Process:  Coherent  Orientation:  Full (Time, Place, and Person)  Thought Content:  Logical  Suicidal Thoughts:  No  Homicidal Thoughts:  No  Memory:  Immediate;   Fair Recent;   Fair Remote;   Fair  Judgement:  Fair  Insight:  Fair  Psychomotor Activity:  Normal  Concentration:  Concentration: Fair  Recall:  [employer] of Knowledge:  Fair  Language:  Fair  Akathisia:  No  Handed:  Right  AIMS (if indicated):     Assets:  Desire for Improvement  ADL's:  Intact  Cognition:  WNL  Sleep:  Number of Hours: 6.15     Treatment Plan Summary: Daily contact with patient to assess and evaluate symptoms and progress in treatment, Medication management and Plan 19 year old woman with a history of depression made a suicide attempt that was then complicated by seizures and cardiac arrest.  She is currently medically stable.  No sign of serotonin syndrome.  I do think we need to get another EKG as there has not been one done in about a week.  No change to medications starting today reevaluate tomorrow to consider restarting antidepressant medicine.  Patient tells me that her family is considering having her go to a longer term treatment program after discharge.  We will follow-up  with social work about discharge planning.  Mordecai Rasmussen, MD 03/01/2019, 2:17 PM

## 2019-03-01 NOTE — [facility] Group Notes (Signed)
LCSW Group Therapy Note   03/01/2019 1:00 PM  Type of Therapy and Topic:  Group Therapy:  Overcoming Obstacles   Participation Level:  Active   Description of Group:    In this group patients will be encouraged to explore what they see as obstacles to their own wellness and recovery. They will be guided to discuss their thoughts, feelings, and behaviors related to these obstacles. The group will process together ways to cope with barriers, with attention given to specific choices patients can make. Each patient will be challenged to identify changes they are motivated to make in order to overcome their obstacles. This group will be process-oriented, with patients participating in exploration of their own experiences as well as giving and receiving support and challenge from other group members.   Therapeutic Goals: 1. Patient will identify personal and current obstacles as they relate to admission. 2. Patient will identify barriers that currently interfere with their wellness or overcoming obstacles.  3. Patient will identify feelings, thought process and behaviors related to these barriers. 4. Patient will identify two changes they are willing to make to overcome these obstacles:      Summary of Patient Progress Pt was present and attentive in group.  Patient shared how she tries to focus on "small wins are still wins" and how people should focus on that to help motivate themselves.  Patient discussed how she sets smaller goals in an effort to meet her larger ones.    Therapeutic Modalities:   Cognitive Behavioral Therapy Solution Focused Therapy Motivational Interviewing Relapse Prevention Therapy  Penni Homans, MSW, LCSW 03/01/2019 12:47 PM

## 2019-03-01 NOTE — [facility] Suicide Risk Assessment (Signed)
[facility] INPATIENT:  Family/Significant Other Suicide Prevention Education  Suicide Prevention Education:  Education Completed; Molly Callahan, mother [phone] has been identified by the patient as the family member/significant other with whom the patient will be residing, and identified as the person(s) who will aid the patient in the event of a mental health crisis (suicidal ideations/suicide attempt).  With written consent from the patient, the family member/significant other has been provided the following suicide prevention education, prior to the and/or following the discharge of the patient.  The suicide prevention education provided includes the following:  Suicide risk factors  Suicide prevention and interventions  National Suicide Hotline telephone number  [facility] assessment telephone number  [facility] Emergency Assistance 911  [facility] and/or Residential Mobile Crisis Unit telephone number  Request made of family/significant other to:  Remove weapons (e.g., guns, rifles, knives), all items previously/currently identified as safety concern.    Remove drugs/medications (over-the-counter, prescriptions, illicit drugs), all items previously/currently identified as a safety concern.  The family member/significant other verbalizes understanding of the suicide prevention education information provided.  The family member/significant other agrees to remove the items of safety concern listed above.  CSW spoke with pts mother, Molly Callahan who reported that on the 27th, pts took all her prescription medications and overdosed and ended up in the ICU for a while. Per mother, pt was receiving therapy at [facility] in [place], but they are currently looking to get her into [facility] for residential mental health services. Mother expressed no HI concerns and reported there are no weapons in the home as they have been removed. Mother stated she has  some SI concerns as pt had a serious OD and is worried that if it happens again she wont find pt in time. Mother expressed some concerns with pt returning home stating that she feels pt may not be ready, but they are open to pt coming home for a few days before she goes into [facility] if she is unable to go straight to [facility].  Molly Callahan MSW LCSW 03/01/2019, 2:23 PM

## 2019-03-01 NOTE — Plan of Care (Signed)
  Problem: Safety: Goal: Periods of time without injury will increase Outcome: Progressing   

## 2019-03-02 MED ORDER — PRENATAL MULTIVITAMIN CH: 1.0000 | ORAL_TABLET | Freq: Every day | ORAL | 0 refills | Status: AC

## 2019-03-02 MED ORDER — OMEGA-3-ACID ETHYL ESTERS 1 G PO CAPS: 1.0000 g | ORAL_CAPSULE | Freq: Two times a day (BID) | ORAL | 0 refills | Status: AC

## 2019-03-02 MED ORDER — WHITE PETROLATUM EX OINT
TOPICAL_OINTMENT | CUTANEOUS | Status: DC | PRN
  Filled 2019-03-02: qty 5

## 2019-03-02 MED ORDER — ESCITALOPRAM OXALATE 10 MG PO TABS
10.0000 mg | ORAL_TABLET | Freq: Every day | ORAL | Status: DC
  Administered 2019-03-02: 10 mg via ORAL
  Filled 2019-03-02: qty 1

## 2019-03-02 MED ORDER — PRAZOSIN HCL 2 MG PO CAPS: 2.0000 mg | ORAL_CAPSULE | Freq: Every day | ORAL | 0 refills | Status: AC

## 2019-03-02 MED ORDER — HYDROXYZINE HCL 25 MG PO TABS: 25.0000 mg | ORAL_TABLET | Freq: Three times a day (TID) | ORAL | 0 refills | Status: AC | PRN

## 2019-03-02 MED ORDER — ESCITALOPRAM OXALATE 10 MG PO TABS: 10.0000 mg | ORAL_TABLET | Freq: Every day | ORAL | 0 refills | Status: AC

## 2019-03-02 NOTE — Discharge Summary (Signed)
Physician Discharge Summary Note  Patient:  Molly Callahan is an 19 y.o., female MRN:  [id number] DOB:  Nov 18, 2000 Patient phone:  [phone] (home)  Patient address:   [address] [place] [address],  Total Time spent with patient: 30 minutes  Date of Admission:  02/26/2019 Date of Discharge: 03/02/2019  Reason for Admission:  This is the first psychiatric admission for Molly Callahan, she is 19 years of age she is single, she came to our attention after initial stabilization at [facility].  The patient had overdosed on Wellbutrin and Lexapro, suffering seizure activity, requiring intubation in the emergency department, followed by QTC prolongation and VT arrest.  She required defibrillation x1 and required CPR approximately 3 minutes.  The patient has a chronic history of depression and anxiety which necessitated the prescriptions for Lexapro and Wellbutrin, she is also been treated with sertraline in the past.  The most recent stressor was a break-up with a boyfriend, the patient herself has no recall of her overdose and has short-term memory issues at this point in time.  She was also thought to have serotonin syndrome for a period of time due to the overdose.  But she has struggled with depression for approximately 3 to 4 years.  Her mother provided further history.  The patient herself is alert oriented to person place time situation not date.  Has short-term memory deficits as discussed, has no recall of the overdose just remembers "waking up in the hospital" she would not elaborate on her stressors.  She was dreaming about her boyfriend and this was upsetting to her. She has never tried to hurt herself before.  She denied substance abuse but her drug screen did show cannabis mother reports that that is not a dependency. Associated Signs/Symptoms: Depression Symptoms:  anhedonia, impaired memory, (Hypo) Manic Symptoms:  No evidence of mania Anxiety Symptoms:  Not excessively  anxious at this point in time Psychotic Symptoms:  No evidence of psychosis PTSD Symptoms: NA  Principal Problem: MDD (major depressive disorder), recurrent severe, without psychosis (HCC) Discharge Diagnoses: Principal Problem:   MDD (major depressive disorder), recurrent severe, without psychosis (HCC) Active Problems:   Suicidal ideation   Anxiety   Overdose   Past Psychiatric History: Prior treatment with sertraline, only outpatient therapy for depression  Past Medical History:  Past Medical History:  Diagnosis Date  . Anxiety   . 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 Depression   . Suicidal ideation 03/19/2016    Past Surgical History:  Procedure Laterality Date  . NO PAST SURGERIES     Family History: History reviewed. No pertinent family history. Family Psychiatric  History: Patient unaware Social History:  Social History   Substance and Sexual Activity  Alcohol Use No     Social History   Substance and Sexual Activity  Drug Use No    Social History   Socioeconomic History  . Marital status: Single    Spouse name: Not on file  . Number of children: Not on file  . Years of education: Not on file  . Highest education level: Not on file  Occupational History  . Not on file  Tobacco Use  . Smoking status: Current Some Day Smoker  . Smokeless tobacco: Never Used  Substance and Sexual Activity  . Alcohol use: No  . Drug use: No  . Sexual activity: Yes    Birth control/protection: Condom  Other Topics Concern  . Not on file  Social History Narrative  .  Not on file   Social Determinants of Health   Financial Resource Strain:   . Difficulty of Paying Living Expenses: Not on file  Food Insecurity:   . Worried About Charity fundraiser in the Last Year: Not on file  . Ran Out of Food in the Last Year: Not on file  Transportation Needs:   . Lack of Transportation (Medical): Not on file  . Lack of Transportation (Non-Medical): Not on file   Physical Activity:   . Days of Exercise per Week: Not on file  . Minutes of Exercise per Session: Not on file  Stress:   . Feeling of Stress : Not on file  Social Connections:   . Frequency of Communication with Friends and Family: Not on file  . Frequency of Social Gatherings with Friends and Family: Not on file  . Attends Religious Services: Not on file  . Active Member of Clubs or Organizations: Not on file  . Attends Archivist Meetings: Not on file  . Marital Status: Not on file    Hospital Course:  Molly Callahan was admitted for MDD (major depressive disorder), recurrent severe, without psychosis ([place]), suicide attempt by overdose s/p cardiac arrest, and crisis management.  She is unable to recall the event and denies having suicidal ideation prior to the admission. She was treated with the following medications lexapro 10mg  po daily for depression, Hydroxyzine 25mg  po TID prn for anxiety, and prazosin capsule 2mg  po qhs for nightmares.  Molly Callahan was discharged with current medication and was instructed on how to take medications as prescribed; (details listed below under Medication List).  Medical problems were identified and treated as needed.  Home medications were restarted as appropriate.  Improvement was monitored by observation and Molly Callahan daily report of symptom reduction.  Emotional and mental status was monitored by daily self-inventory reports completed by Molly Callahan and clinical staff.     Molly Callahan was evaluated by the treatment team for stability and plans for continued recovery upon discharge.  Molly Callahan motivation was an integral factor for scheduling further treatment.  Employment, transportation, bed availability, health status, family support, and any pending legal issues were also considered during her hospital stay.  She was offered further treatment options upon discharge including but not limited to Residential, Intensive  Outpatient, and Outpatient treatment.  Molly Callahan will follow up with the services as listed below under Follow Up Information.     Upon completion of this admission the Molly Callahan was both mentally and medically stable for discharge denying suicidal/homicidal ideation, auditory/visual/tactile hallucinations, delusional thoughts and paranoia.      Musculoskeletal: Strength & Muscle Tone: within normal limits Gait & Station: normal Patient leans: N/A  Psychiatric Specialty Exam: See MD SRA Physical Exam  Review of Systems  Blood pressure 120/79, pulse (!) 115, temperature 98.4 F (36.9 C), temperature source Oral, resp. rate 18, height 5\' 6"  (1.676 m), weight 46.3 kg, last menstrual period 02/24/2019, SpO2 100 %.Body mass index is 16.46 kg/m.  Sleep:  Number of Hours: 5.75     Have you used any form of tobacco in the last 30 days? (Cigarettes, Smokeless Tobacco, Cigars, and/or Pipes): Yes  Has this patient used any form of tobacco in the last 30 days? (Cigarettes, Smokeless Tobacco, Cigars, and/or Pipes)  No  Blood Alcohol level:  Lab Results  Component Value Date   ETH <10 02/17/2019   ETH <5 06/21/2016  Metabolic Disorder Labs:  Lab Results  Component Value Date   HGBA1C 4.8 10/22/2016   MPG 91.06 10/22/2016   MPG 100 03/19/2016   No results found for: PROLACTIN Lab Results  Component Value Date   CHOL 157 10/22/2016   TRIG 57 10/22/2016   HDL 46 10/22/2016   CHOLHDL 3.4 10/22/2016   VLDL 11 10/22/2016   LDLCALC 100 (H) 10/22/2016   LDLCALC 80 03/19/2016    See Psychiatric Specialty Exam and Suicide Risk Assessment completed by Attending Physician prior to discharge.  Discharge destination:  Home  Is patient on multiple antipsychotic therapies at discharge:  No   Has Patient had three or more failed trials of antipsychotic monotherapy by history:  No  Recommended Plan for Multiple Antipsychotic Therapies: NA   Allergies as of 03/02/2019       Reactions   Pollen Extract Other (See Comments)   Teary eyes      Medication List    STOP taking these medications   magnesium oxide 400 (241.3 Mg) MG tablet Commonly known as: MAG-OX   multivitamin with minerals Tabs tablet   potassium chloride SA 20 MEQ tablet Commonly known as: KLOR-CON     TAKE these medications     Indication  escitalopram 10 MG tablet Commonly known as: LEXAPRO Take 1 tablet (10 mg total) by mouth daily.  Indication: Major Depressive Disorder   hydrOXYzine 25 MG tablet Commonly known as: ATARAX/VISTARIL Take 1 tablet (25 mg total) by mouth 3 (three) times daily as needed for anxiety.  Indication: Feeling Anxious, State of Being Sedated, Tension   omega-3 acid ethyl esters 1 g capsule Commonly known as: LOVAZA Take 1 capsule (1 g total) by mouth 2 (two) times daily.  Indication: High Amount of Triglycerides in the Blood   prazosin 2 MG capsule Commonly known as: MINIPRESS Take 1 capsule (2 mg total) by mouth at bedtime.  Indication: High Blood Pressure Disorder   prenatal multivitamin Tabs tablet Take 1 tablet by mouth daily at 12 noon.  Indication: nutritional deficiency      Follow-up Information    [facility] Follow up.   Why: You are currently on the waitlist for admission. Please follow up with them directly at discharge with any questions. Contact information: [address] [place], [place] [address] Phone: [phone] Fax: [phone]          Follow-up recommendations:  Activity:  Increase activity as tolerated.  Diet:  Routine diet as directed by outpatient provider Tests:  Routine testing as directed. Other:  Even if you begin to feel better continue taking your medications.   Comments:   Signed: Maryagnes Amos, FNP 03/02/2019, 11:00 AM

## 2019-03-02 NOTE — Progress Notes (Signed)
This writer assumed care of patient at 0300, and patient has been asleep, no issues or complaints reported. Patient remains safe on the unit. 

## 2019-03-02 NOTE — BHH Group Notes (Signed)
Overcoming Obstacles  03/02/2019 1PM  Type of Therapy and Topic:  Group Therapy:  Overcoming Obstacles  Participation Level:  None    Description of Group:    In this group patients will be encouraged to explore what they see as obstacles to their own wellness and recovery. They will be guided to discuss their thoughts, feelings, and behaviors related to these obstacles. The group will process together ways to cope with barriers, with attention given to specific choices patients can make. Each patient will be challenged to identify changes they are motivated to make in order to overcome their obstacles. This group will be process-oriented, with patients participating in exploration of their own experiences as well as giving and receiving support and challenge from other group members.   Therapeutic Goals: 1. Patient will identify personal and current obstacles as they relate to admission. 2. Patient will identify barriers that currently interfere with their wellness or overcoming obstacles.  3. Patient will identify feelings, thought process and behaviors related to these barriers. 4. Patient will identify two changes they are willing to make to overcome these obstacles:      Summary of Patient Progress No participation or input provided during session. Pt sat quietly.    Therapeutic Modalities:   Cognitive Behavioral Therapy Solution Focused Therapy Motivational Interviewing Relapse Prevention Therapy    Lowella Dandy, MSW, LCSW 03/02/2019 2:07 PM

## 2019-03-02 NOTE — B[facility] Suicide Risk Assessment (Signed)
[facility] Discharge Suicide Risk Assessment   Principal Problem: MDD (major depressive disorder), recurrent severe, without psychosis (HCC) Discharge Diagnoses: Principal Problem:   MDD (major depressive disorder), recurrent severe, without psychosis (HCC) Active Problems:   Suicidal ideation   Anxiety   Overdose   Total Time spent with patient: 30 minutes  Musculoskeletal: Strength & Muscle Tone: within normal limits Gait & Station: normal Patient leans: N/A  Psychiatric Specialty Exam: Review of Systems  Constitutional: Negative.   HENT: Negative.   Eyes: Negative.   Respiratory: Negative.   Cardiovascular: Negative.   Gastrointestinal: Negative.   Musculoskeletal: Negative.   Skin: Negative.   Neurological: Negative.   Psychiatric/Behavioral: Negative.     Blood pressure 120/79, pulse (!) 115, temperature 98.4 F (36.9 C), temperature source Oral, resp. rate 18, height 5\' 6"  (1.676 m), weight 46.3 kg, last menstrual period 02/24/2019, SpO2 100 %.Body mass index is 16.46 kg/m.  General Appearance: Casual  Eye Contact::  Good  Speech:  Clear and Coherent409  Volume:  Normal  Mood:  Euthymic  Affect:  Congruent  Thought Process:  Coherent  Orientation:  Full (Time, Place, and Person)  Thought Content:  Logical  Suicidal Thoughts:  No  Homicidal Thoughts:  No  Memory:  Immediate;   Fair Recent;   Fair Remote;   Fair  Judgement:  Fair  Insight:  Fair  Psychomotor Activity:  Normal  Concentration:  Fair  Recall:  [phone] of Knowledge:Fair  Language: Fair  Akathisia:  No  Handed:  Right  AIMS (if indicated):     Assets:  Desire for Improvement Housing Physical Health Resilience Social Support  Sleep:  Number of Hours: 5.75  Cognition: WNL  ADL's:  Intact   Mental Status Per Nursing Assessment::   On Admission:  NA  Demographic Factors:  Adolescent or young adult  Loss Factors: Loss of significant relationship  Historical Factors: NA  Risk  Reduction Factors:   Sense of responsibility to family, Living with another person, especially a relative, Positive social support, Positive therapeutic relationship and Positive coping skills or problem solving skills  Continued Clinical Symptoms:  Depression:   Impulsivity  Cognitive Features That Contribute To Risk:  None    Suicide Risk:  Minimal: No identifiable suicidal ideation.  Patients presenting with no risk factors but with morbid ruminations; may be classified as minimal risk based on the severity of the depressive symptoms  Follow-up Information    [facility] Follow up.   Why: You are currently on the waitlist for admission. Please follow up with them directly at discharge with any questions. Contact information: [address] [place], [place] Phone: [phone] Fax: [phone]          Plan Of Care/Follow-up recommendations:  Activity:  Activity as tolerated Diet:  Regular diet Other:  Follow-up with outpatient care with regular providers and with planned admission to [facility] in [facility], MD 03/02/2019, 10:36 AM

## 2019-03-02 NOTE — Progress Notes (Signed)
Patient ID: Molly Callahan, female   DOB: 2000-09-24, 19 y.o.   MRN: [id number]   Discharge Note:  Patient denies SI/HI/AVH at this time. Discharge instructions, AVS, prescriptions, and transition record gone over with patient. Patient agrees to comply with medication management, follow-up visit, and outpatient therapy. Patient belongings returned to patient. Patient questions and concerns addressed and answered. Patient ambulatory off unit. Patient discharged to home with Mother.

## 2019-03-03 DIAGNOSIS — F319 Bipolar disorder, unspecified: Secondary | ICD-10-CM | POA: Diagnosis not present

## 2019-03-15 DIAGNOSIS — Z20822 Contact with and (suspected) exposure to covid-19: Secondary | ICD-10-CM | POA: Diagnosis not present

## 2019-03-16 DIAGNOSIS — F4310 Post-traumatic stress disorder, unspecified: Secondary | ICD-10-CM | POA: Diagnosis not present

## 2019-03-16 DIAGNOSIS — F1020 Alcohol dependence, uncomplicated: Secondary | ICD-10-CM | POA: Diagnosis not present

## 2019-03-16 DIAGNOSIS — F341 Dysthymic disorder: Secondary | ICD-10-CM | POA: Diagnosis not present

## 2019-03-16 DIAGNOSIS — F1220 Cannabis dependence, uncomplicated: Secondary | ICD-10-CM | POA: Diagnosis not present

## 2019-03-17 DIAGNOSIS — F341 Dysthymic disorder: Secondary | ICD-10-CM | POA: Diagnosis not present

## 2019-03-17 DIAGNOSIS — F329 Major depressive disorder, single episode, unspecified: Secondary | ICD-10-CM | POA: Diagnosis not present

## 2019-03-23 DIAGNOSIS — F341 Dysthymic disorder: Secondary | ICD-10-CM | POA: Diagnosis not present

## 2019-03-23 DIAGNOSIS — Z0000 Encounter for general adult medical examination without abnormal findings: Secondary | ICD-10-CM | POA: Diagnosis not present

## 2019-03-23 DIAGNOSIS — F1220 Cannabis dependence, uncomplicated: Secondary | ICD-10-CM | POA: Diagnosis not present

## 2019-03-23 DIAGNOSIS — F1020 Alcohol dependence, uncomplicated: Secondary | ICD-10-CM | POA: Diagnosis not present

## 2019-04-19 DIAGNOSIS — F1810 Inhalant abuse, uncomplicated: Secondary | ICD-10-CM | POA: Diagnosis not present

## 2019-04-19 DIAGNOSIS — F341 Dysthymic disorder: Secondary | ICD-10-CM | POA: Diagnosis not present

## 2019-04-19 DIAGNOSIS — F1020 Alcohol dependence, uncomplicated: Secondary | ICD-10-CM | POA: Diagnosis not present

## 2019-04-19 DIAGNOSIS — F1220 Cannabis dependence, uncomplicated: Secondary | ICD-10-CM | POA: Diagnosis not present

## 2019-04-19 DIAGNOSIS — F332 Major depressive disorder, recurrent severe without psychotic features: Secondary | ICD-10-CM | POA: Diagnosis not present

## 2019-04-20 DIAGNOSIS — F1810 Inhalant abuse, uncomplicated: Secondary | ICD-10-CM | POA: Diagnosis not present

## 2019-04-20 DIAGNOSIS — F341 Dysthymic disorder: Secondary | ICD-10-CM | POA: Diagnosis not present

## 2019-04-20 DIAGNOSIS — F1020 Alcohol dependence, uncomplicated: Secondary | ICD-10-CM | POA: Diagnosis not present

## 2019-04-20 DIAGNOSIS — F332 Major depressive disorder, recurrent severe without psychotic features: Secondary | ICD-10-CM | POA: Diagnosis not present

## 2019-04-20 DIAGNOSIS — F1220 Cannabis dependence, uncomplicated: Secondary | ICD-10-CM | POA: Diagnosis not present

## 2019-04-21 DIAGNOSIS — F341 Dysthymic disorder: Secondary | ICD-10-CM | POA: Diagnosis not present

## 2019-04-21 DIAGNOSIS — F1810 Inhalant abuse, uncomplicated: Secondary | ICD-10-CM | POA: Diagnosis not present

## 2019-04-21 DIAGNOSIS — F332 Major depressive disorder, recurrent severe without psychotic features: Secondary | ICD-10-CM | POA: Diagnosis not present

## 2019-04-21 DIAGNOSIS — F1020 Alcohol dependence, uncomplicated: Secondary | ICD-10-CM | POA: Diagnosis not present

## 2019-04-21 DIAGNOSIS — F1220 Cannabis dependence, uncomplicated: Secondary | ICD-10-CM | POA: Diagnosis not present

## 2019-04-22 DIAGNOSIS — F332 Major depressive disorder, recurrent severe without psychotic features: Secondary | ICD-10-CM | POA: Diagnosis not present

## 2019-04-22 DIAGNOSIS — F341 Dysthymic disorder: Secondary | ICD-10-CM | POA: Diagnosis not present

## 2019-04-22 DIAGNOSIS — F1810 Inhalant abuse, uncomplicated: Secondary | ICD-10-CM | POA: Diagnosis not present

## 2019-04-22 DIAGNOSIS — F1020 Alcohol dependence, uncomplicated: Secondary | ICD-10-CM | POA: Diagnosis not present

## 2019-04-22 DIAGNOSIS — F1220 Cannabis dependence, uncomplicated: Secondary | ICD-10-CM | POA: Diagnosis not present

## 2019-04-23 DIAGNOSIS — F1220 Cannabis dependence, uncomplicated: Secondary | ICD-10-CM | POA: Diagnosis not present

## 2019-04-23 DIAGNOSIS — F332 Major depressive disorder, recurrent severe without psychotic features: Secondary | ICD-10-CM | POA: Diagnosis not present

## 2019-04-23 DIAGNOSIS — F341 Dysthymic disorder: Secondary | ICD-10-CM | POA: Diagnosis not present

## 2019-04-23 DIAGNOSIS — F1020 Alcohol dependence, uncomplicated: Secondary | ICD-10-CM | POA: Diagnosis not present

## 2019-04-23 DIAGNOSIS — F1810 Inhalant abuse, uncomplicated: Secondary | ICD-10-CM | POA: Diagnosis not present

## 2019-04-24 ENCOUNTER — Ambulatory Visit: Payer: BC Managed Care – PPO | Attending: Internal Medicine

## 2019-04-24 DIAGNOSIS — Z23 Encounter for immunization: Secondary | ICD-10-CM

## 2019-04-24 NOTE — Progress Notes (Signed)
   Covid-19 Vaccination Clinic  Name:  Molly Callahan    MRN: [id number] DOB: May 23, 2000  04/24/2019  [name] was observed post Covid-19 immunization for 15 minutes without incident. She was provided with Vaccine Information Sheet and instruction to access the V-Safe system.   [name] was instructed to call 911 with any severe reactions post vaccine: Marland Kitchen Difficulty breathing  . Swelling of face and throat  . A fast heartbeat  . A bad rash all over body  . Dizziness and weakness   Immunizations Administered    Name Date Dose VIS Date Route   Pfizer COVID-19 Vaccine 04/24/2019 10:30 AM 0.3 mL 01/01/2019 Intramuscular   Manufacturer: [employer], [employer]   Lot: [id number]   NDC: [address]-7

## 2019-04-26 DIAGNOSIS — F332 Major depressive disorder, recurrent severe without psychotic features: Secondary | ICD-10-CM | POA: Diagnosis not present

## 2019-04-26 DIAGNOSIS — F1220 Cannabis dependence, uncomplicated: Secondary | ICD-10-CM | POA: Diagnosis not present

## 2019-04-26 DIAGNOSIS — F341 Dysthymic disorder: Secondary | ICD-10-CM | POA: Diagnosis not present

## 2019-04-26 DIAGNOSIS — F1810 Inhalant abuse, uncomplicated: Secondary | ICD-10-CM | POA: Diagnosis not present

## 2019-04-26 DIAGNOSIS — F1020 Alcohol dependence, uncomplicated: Secondary | ICD-10-CM | POA: Diagnosis not present

## 2019-04-27 DIAGNOSIS — F1810 Inhalant abuse, uncomplicated: Secondary | ICD-10-CM | POA: Diagnosis not present

## 2019-04-27 DIAGNOSIS — F1020 Alcohol dependence, uncomplicated: Secondary | ICD-10-CM | POA: Diagnosis not present

## 2019-04-27 DIAGNOSIS — F332 Major depressive disorder, recurrent severe without psychotic features: Secondary | ICD-10-CM | POA: Diagnosis not present

## 2019-04-27 DIAGNOSIS — F1220 Cannabis dependence, uncomplicated: Secondary | ICD-10-CM | POA: Diagnosis not present

## 2019-04-27 DIAGNOSIS — F341 Dysthymic disorder: Secondary | ICD-10-CM | POA: Diagnosis not present

## 2019-04-28 DIAGNOSIS — F1810 Inhalant abuse, uncomplicated: Secondary | ICD-10-CM | POA: Diagnosis not present

## 2019-04-28 DIAGNOSIS — F1020 Alcohol dependence, uncomplicated: Secondary | ICD-10-CM | POA: Diagnosis not present

## 2019-04-28 DIAGNOSIS — F341 Dysthymic disorder: Secondary | ICD-10-CM | POA: Diagnosis not present

## 2019-04-28 DIAGNOSIS — F1220 Cannabis dependence, uncomplicated: Secondary | ICD-10-CM | POA: Diagnosis not present

## 2019-04-28 DIAGNOSIS — F332 Major depressive disorder, recurrent severe without psychotic features: Secondary | ICD-10-CM | POA: Diagnosis not present

## 2019-04-29 DIAGNOSIS — F332 Major depressive disorder, recurrent severe without psychotic features: Secondary | ICD-10-CM | POA: Diagnosis not present

## 2019-04-29 DIAGNOSIS — F341 Dysthymic disorder: Secondary | ICD-10-CM | POA: Diagnosis not present

## 2019-04-29 DIAGNOSIS — F1220 Cannabis dependence, uncomplicated: Secondary | ICD-10-CM | POA: Diagnosis not present

## 2019-04-29 DIAGNOSIS — F1810 Inhalant abuse, uncomplicated: Secondary | ICD-10-CM | POA: Diagnosis not present

## 2019-04-29 DIAGNOSIS — F1020 Alcohol dependence, uncomplicated: Secondary | ICD-10-CM | POA: Diagnosis not present

## 2019-04-30 DIAGNOSIS — F1020 Alcohol dependence, uncomplicated: Secondary | ICD-10-CM | POA: Diagnosis not present

## 2019-04-30 DIAGNOSIS — F1220 Cannabis dependence, uncomplicated: Secondary | ICD-10-CM | POA: Diagnosis not present

## 2019-04-30 DIAGNOSIS — F341 Dysthymic disorder: Secondary | ICD-10-CM | POA: Diagnosis not present

## 2019-04-30 DIAGNOSIS — F332 Major depressive disorder, recurrent severe without psychotic features: Secondary | ICD-10-CM | POA: Diagnosis not present

## 2019-04-30 DIAGNOSIS — F1810 Inhalant abuse, uncomplicated: Secondary | ICD-10-CM | POA: Diagnosis not present

## 2019-05-03 DIAGNOSIS — F341 Dysthymic disorder: Secondary | ICD-10-CM | POA: Diagnosis not present

## 2019-05-03 DIAGNOSIS — F1220 Cannabis dependence, uncomplicated: Secondary | ICD-10-CM | POA: Diagnosis not present

## 2019-05-03 DIAGNOSIS — F1810 Inhalant abuse, uncomplicated: Secondary | ICD-10-CM | POA: Diagnosis not present

## 2019-05-03 DIAGNOSIS — F332 Major depressive disorder, recurrent severe without psychotic features: Secondary | ICD-10-CM | POA: Diagnosis not present

## 2019-05-03 DIAGNOSIS — F1020 Alcohol dependence, uncomplicated: Secondary | ICD-10-CM | POA: Diagnosis not present

## 2019-05-04 DIAGNOSIS — F1020 Alcohol dependence, uncomplicated: Secondary | ICD-10-CM | POA: Diagnosis not present

## 2019-05-04 DIAGNOSIS — F1220 Cannabis dependence, uncomplicated: Secondary | ICD-10-CM | POA: Diagnosis not present

## 2019-05-04 DIAGNOSIS — F1810 Inhalant abuse, uncomplicated: Secondary | ICD-10-CM | POA: Diagnosis not present

## 2019-05-04 DIAGNOSIS — F332 Major depressive disorder, recurrent severe without psychotic features: Secondary | ICD-10-CM | POA: Diagnosis not present

## 2019-05-04 DIAGNOSIS — F341 Dysthymic disorder: Secondary | ICD-10-CM | POA: Diagnosis not present

## 2019-05-05 DIAGNOSIS — F1020 Alcohol dependence, uncomplicated: Secondary | ICD-10-CM | POA: Diagnosis not present

## 2019-05-05 DIAGNOSIS — F1220 Cannabis dependence, uncomplicated: Secondary | ICD-10-CM | POA: Diagnosis not present

## 2019-05-05 DIAGNOSIS — F341 Dysthymic disorder: Secondary | ICD-10-CM | POA: Diagnosis not present

## 2019-05-05 DIAGNOSIS — F332 Major depressive disorder, recurrent severe without psychotic features: Secondary | ICD-10-CM | POA: Diagnosis not present

## 2019-05-05 DIAGNOSIS — F1810 Inhalant abuse, uncomplicated: Secondary | ICD-10-CM | POA: Diagnosis not present

## 2019-05-06 DIAGNOSIS — F1810 Inhalant abuse, uncomplicated: Secondary | ICD-10-CM | POA: Diagnosis not present

## 2019-05-06 DIAGNOSIS — F341 Dysthymic disorder: Secondary | ICD-10-CM | POA: Diagnosis not present

## 2019-05-06 DIAGNOSIS — F1020 Alcohol dependence, uncomplicated: Secondary | ICD-10-CM | POA: Diagnosis not present

## 2019-05-06 DIAGNOSIS — F1220 Cannabis dependence, uncomplicated: Secondary | ICD-10-CM | POA: Diagnosis not present

## 2019-05-06 DIAGNOSIS — F332 Major depressive disorder, recurrent severe without psychotic features: Secondary | ICD-10-CM | POA: Diagnosis not present

## 2019-05-07 DIAGNOSIS — F341 Dysthymic disorder: Secondary | ICD-10-CM | POA: Diagnosis not present

## 2019-05-07 DIAGNOSIS — F1810 Inhalant abuse, uncomplicated: Secondary | ICD-10-CM | POA: Diagnosis not present

## 2019-05-07 DIAGNOSIS — F1020 Alcohol dependence, uncomplicated: Secondary | ICD-10-CM | POA: Diagnosis not present

## 2019-05-07 DIAGNOSIS — F332 Major depressive disorder, recurrent severe without psychotic features: Secondary | ICD-10-CM | POA: Diagnosis not present

## 2019-05-07 DIAGNOSIS — F1220 Cannabis dependence, uncomplicated: Secondary | ICD-10-CM | POA: Diagnosis not present

## 2019-05-10 DIAGNOSIS — F1810 Inhalant abuse, uncomplicated: Secondary | ICD-10-CM | POA: Diagnosis not present

## 2019-05-10 DIAGNOSIS — F1220 Cannabis dependence, uncomplicated: Secondary | ICD-10-CM | POA: Diagnosis not present

## 2019-05-10 DIAGNOSIS — F332 Major depressive disorder, recurrent severe without psychotic features: Secondary | ICD-10-CM | POA: Diagnosis not present

## 2019-05-10 DIAGNOSIS — F1020 Alcohol dependence, uncomplicated: Secondary | ICD-10-CM | POA: Diagnosis not present

## 2019-05-10 DIAGNOSIS — F341 Dysthymic disorder: Secondary | ICD-10-CM | POA: Diagnosis not present

## 2019-05-11 DIAGNOSIS — F1220 Cannabis dependence, uncomplicated: Secondary | ICD-10-CM | POA: Diagnosis not present

## 2019-05-11 DIAGNOSIS — F332 Major depressive disorder, recurrent severe without psychotic features: Secondary | ICD-10-CM | POA: Diagnosis not present

## 2019-05-11 DIAGNOSIS — F1020 Alcohol dependence, uncomplicated: Secondary | ICD-10-CM | POA: Diagnosis not present

## 2019-05-11 DIAGNOSIS — F1810 Inhalant abuse, uncomplicated: Secondary | ICD-10-CM | POA: Diagnosis not present

## 2019-05-11 DIAGNOSIS — F341 Dysthymic disorder: Secondary | ICD-10-CM | POA: Diagnosis not present

## 2019-05-12 DIAGNOSIS — F1220 Cannabis dependence, uncomplicated: Secondary | ICD-10-CM | POA: Diagnosis not present

## 2019-05-12 DIAGNOSIS — F1020 Alcohol dependence, uncomplicated: Secondary | ICD-10-CM | POA: Diagnosis not present

## 2019-05-12 DIAGNOSIS — F1810 Inhalant abuse, uncomplicated: Secondary | ICD-10-CM | POA: Diagnosis not present

## 2019-05-12 DIAGNOSIS — F341 Dysthymic disorder: Secondary | ICD-10-CM | POA: Diagnosis not present

## 2019-05-12 DIAGNOSIS — F332 Major depressive disorder, recurrent severe without psychotic features: Secondary | ICD-10-CM | POA: Diagnosis not present

## 2019-05-13 DIAGNOSIS — F341 Dysthymic disorder: Secondary | ICD-10-CM | POA: Diagnosis not present

## 2019-05-13 DIAGNOSIS — F1810 Inhalant abuse, uncomplicated: Secondary | ICD-10-CM | POA: Diagnosis not present

## 2019-05-13 DIAGNOSIS — F332 Major depressive disorder, recurrent severe without psychotic features: Secondary | ICD-10-CM | POA: Diagnosis not present

## 2019-05-13 DIAGNOSIS — F1020 Alcohol dependence, uncomplicated: Secondary | ICD-10-CM | POA: Diagnosis not present

## 2019-05-13 DIAGNOSIS — F1220 Cannabis dependence, uncomplicated: Secondary | ICD-10-CM | POA: Diagnosis not present

## 2019-05-14 DIAGNOSIS — F332 Major depressive disorder, recurrent severe without psychotic features: Secondary | ICD-10-CM | POA: Diagnosis not present

## 2019-05-14 DIAGNOSIS — F1020 Alcohol dependence, uncomplicated: Secondary | ICD-10-CM | POA: Diagnosis not present

## 2019-05-14 DIAGNOSIS — F1220 Cannabis dependence, uncomplicated: Secondary | ICD-10-CM | POA: Diagnosis not present

## 2019-05-14 DIAGNOSIS — F341 Dysthymic disorder: Secondary | ICD-10-CM | POA: Diagnosis not present

## 2019-05-14 DIAGNOSIS — F1810 Inhalant abuse, uncomplicated: Secondary | ICD-10-CM | POA: Diagnosis not present

## 2019-05-19 ENCOUNTER — Ambulatory Visit: Payer: Self-pay

## 2019-05-19 DIAGNOSIS — F411 Generalized anxiety disorder: Secondary | ICD-10-CM | POA: Diagnosis not present

## 2019-05-19 DIAGNOSIS — F4312 Post-traumatic stress disorder, chronic: Secondary | ICD-10-CM | POA: Diagnosis not present

## 2019-05-19 DIAGNOSIS — F1220 Cannabis dependence, uncomplicated: Secondary | ICD-10-CM | POA: Diagnosis not present

## 2019-05-19 DIAGNOSIS — F331 Major depressive disorder, recurrent, moderate: Secondary | ICD-10-CM | POA: Diagnosis not present

## 2019-05-20 DIAGNOSIS — F319 Bipolar disorder, unspecified: Secondary | ICD-10-CM | POA: Diagnosis not present

## 2019-05-21 DIAGNOSIS — F331 Major depressive disorder, recurrent, moderate: Secondary | ICD-10-CM | POA: Diagnosis not present

## 2019-05-21 DIAGNOSIS — F411 Generalized anxiety disorder: Secondary | ICD-10-CM | POA: Diagnosis not present

## 2019-05-21 DIAGNOSIS — F1220 Cannabis dependence, uncomplicated: Secondary | ICD-10-CM | POA: Diagnosis not present

## 2019-05-21 DIAGNOSIS — F4312 Post-traumatic stress disorder, chronic: Secondary | ICD-10-CM | POA: Diagnosis not present

## 2019-05-24 DIAGNOSIS — F411 Generalized anxiety disorder: Secondary | ICD-10-CM | POA: Diagnosis not present

## 2019-05-24 DIAGNOSIS — F331 Major depressive disorder, recurrent, moderate: Secondary | ICD-10-CM | POA: Diagnosis not present

## 2019-05-24 DIAGNOSIS — F4312 Post-traumatic stress disorder, chronic: Secondary | ICD-10-CM | POA: Diagnosis not present

## 2019-05-24 DIAGNOSIS — F1220 Cannabis dependence, uncomplicated: Secondary | ICD-10-CM | POA: Diagnosis not present

## 2019-05-26 DIAGNOSIS — F411 Generalized anxiety disorder: Secondary | ICD-10-CM | POA: Diagnosis not present

## 2019-05-26 DIAGNOSIS — F1220 Cannabis dependence, uncomplicated: Secondary | ICD-10-CM | POA: Diagnosis not present

## 2019-05-26 DIAGNOSIS — F4312 Post-traumatic stress disorder, chronic: Secondary | ICD-10-CM | POA: Diagnosis not present

## 2019-05-26 DIAGNOSIS — F331 Major depressive disorder, recurrent, moderate: Secondary | ICD-10-CM | POA: Diagnosis not present

## 2019-05-28 DIAGNOSIS — F4312 Post-traumatic stress disorder, chronic: Secondary | ICD-10-CM | POA: Diagnosis not present

## 2019-05-28 DIAGNOSIS — F1220 Cannabis dependence, uncomplicated: Secondary | ICD-10-CM | POA: Diagnosis not present

## 2019-05-28 DIAGNOSIS — F331 Major depressive disorder, recurrent, moderate: Secondary | ICD-10-CM | POA: Diagnosis not present

## 2019-05-28 DIAGNOSIS — F411 Generalized anxiety disorder: Secondary | ICD-10-CM | POA: Diagnosis not present

## 2019-05-31 DIAGNOSIS — F4312 Post-traumatic stress disorder, chronic: Secondary | ICD-10-CM | POA: Diagnosis not present

## 2019-05-31 DIAGNOSIS — F331 Major depressive disorder, recurrent, moderate: Secondary | ICD-10-CM | POA: Diagnosis not present

## 2019-05-31 DIAGNOSIS — F411 Generalized anxiety disorder: Secondary | ICD-10-CM | POA: Diagnosis not present

## 2019-05-31 DIAGNOSIS — F1220 Cannabis dependence, uncomplicated: Secondary | ICD-10-CM | POA: Diagnosis not present

## 2019-06-02 DIAGNOSIS — F4312 Post-traumatic stress disorder, chronic: Secondary | ICD-10-CM | POA: Diagnosis not present

## 2019-06-02 DIAGNOSIS — F411 Generalized anxiety disorder: Secondary | ICD-10-CM | POA: Diagnosis not present

## 2019-06-02 DIAGNOSIS — F1220 Cannabis dependence, uncomplicated: Secondary | ICD-10-CM | POA: Diagnosis not present

## 2019-06-02 DIAGNOSIS — F331 Major depressive disorder, recurrent, moderate: Secondary | ICD-10-CM | POA: Diagnosis not present

## 2019-06-03 DIAGNOSIS — F411 Generalized anxiety disorder: Secondary | ICD-10-CM | POA: Diagnosis not present

## 2019-06-04 DIAGNOSIS — F331 Major depressive disorder, recurrent, moderate: Secondary | ICD-10-CM | POA: Diagnosis not present

## 2019-06-04 DIAGNOSIS — F4312 Post-traumatic stress disorder, chronic: Secondary | ICD-10-CM | POA: Diagnosis not present

## 2019-06-04 DIAGNOSIS — F411 Generalized anxiety disorder: Secondary | ICD-10-CM | POA: Diagnosis not present

## 2019-06-04 DIAGNOSIS — F1220 Cannabis dependence, uncomplicated: Secondary | ICD-10-CM | POA: Diagnosis not present

## 2019-06-07 DIAGNOSIS — F1220 Cannabis dependence, uncomplicated: Secondary | ICD-10-CM | POA: Diagnosis not present

## 2019-06-07 DIAGNOSIS — F4312 Post-traumatic stress disorder, chronic: Secondary | ICD-10-CM | POA: Diagnosis not present

## 2019-06-07 DIAGNOSIS — F411 Generalized anxiety disorder: Secondary | ICD-10-CM | POA: Diagnosis not present

## 2019-06-07 DIAGNOSIS — F331 Major depressive disorder, recurrent, moderate: Secondary | ICD-10-CM | POA: Diagnosis not present

## 2019-06-09 DIAGNOSIS — F411 Generalized anxiety disorder: Secondary | ICD-10-CM | POA: Diagnosis not present

## 2019-06-09 DIAGNOSIS — F4312 Post-traumatic stress disorder, chronic: Secondary | ICD-10-CM | POA: Diagnosis not present

## 2019-06-09 DIAGNOSIS — F1220 Cannabis dependence, uncomplicated: Secondary | ICD-10-CM | POA: Diagnosis not present

## 2019-06-09 DIAGNOSIS — F331 Major depressive disorder, recurrent, moderate: Secondary | ICD-10-CM | POA: Diagnosis not present

## 2019-06-10 DIAGNOSIS — F411 Generalized anxiety disorder: Secondary | ICD-10-CM | POA: Diagnosis not present

## 2019-06-11 DIAGNOSIS — F331 Major depressive disorder, recurrent, moderate: Secondary | ICD-10-CM | POA: Diagnosis not present

## 2019-06-11 DIAGNOSIS — F4312 Post-traumatic stress disorder, chronic: Secondary | ICD-10-CM | POA: Diagnosis not present

## 2019-06-11 DIAGNOSIS — F1220 Cannabis dependence, uncomplicated: Secondary | ICD-10-CM | POA: Diagnosis not present

## 2019-06-11 DIAGNOSIS — F411 Generalized anxiety disorder: Secondary | ICD-10-CM | POA: Diagnosis not present

## 2019-06-14 DIAGNOSIS — F4312 Post-traumatic stress disorder, chronic: Secondary | ICD-10-CM | POA: Diagnosis not present

## 2019-06-14 DIAGNOSIS — F411 Generalized anxiety disorder: Secondary | ICD-10-CM | POA: Diagnosis not present

## 2019-06-14 DIAGNOSIS — F331 Major depressive disorder, recurrent, moderate: Secondary | ICD-10-CM | POA: Diagnosis not present

## 2019-06-14 DIAGNOSIS — F1220 Cannabis dependence, uncomplicated: Secondary | ICD-10-CM | POA: Diagnosis not present

## 2019-06-16 DIAGNOSIS — F1220 Cannabis dependence, uncomplicated: Secondary | ICD-10-CM | POA: Diagnosis not present

## 2019-06-16 DIAGNOSIS — F4312 Post-traumatic stress disorder, chronic: Secondary | ICD-10-CM | POA: Diagnosis not present

## 2019-06-16 DIAGNOSIS — F411 Generalized anxiety disorder: Secondary | ICD-10-CM | POA: Diagnosis not present

## 2019-06-16 DIAGNOSIS — F331 Major depressive disorder, recurrent, moderate: Secondary | ICD-10-CM | POA: Diagnosis not present

## 2019-06-17 DIAGNOSIS — F411 Generalized anxiety disorder: Secondary | ICD-10-CM | POA: Diagnosis not present

## 2019-06-18 DIAGNOSIS — F331 Major depressive disorder, recurrent, moderate: Secondary | ICD-10-CM | POA: Diagnosis not present

## 2019-06-18 DIAGNOSIS — F4312 Post-traumatic stress disorder, chronic: Secondary | ICD-10-CM | POA: Diagnosis not present

## 2019-06-18 DIAGNOSIS — F411 Generalized anxiety disorder: Secondary | ICD-10-CM | POA: Diagnosis not present

## 2019-06-18 DIAGNOSIS — F1220 Cannabis dependence, uncomplicated: Secondary | ICD-10-CM | POA: Diagnosis not present

## 2019-06-22 DIAGNOSIS — F1220 Cannabis dependence, uncomplicated: Secondary | ICD-10-CM | POA: Diagnosis not present

## 2019-06-22 DIAGNOSIS — F331 Major depressive disorder, recurrent, moderate: Secondary | ICD-10-CM | POA: Diagnosis not present

## 2019-06-22 DIAGNOSIS — F4312 Post-traumatic stress disorder, chronic: Secondary | ICD-10-CM | POA: Diagnosis not present

## 2019-06-22 DIAGNOSIS — F411 Generalized anxiety disorder: Secondary | ICD-10-CM | POA: Diagnosis not present

## 2019-06-23 DIAGNOSIS — F331 Major depressive disorder, recurrent, moderate: Secondary | ICD-10-CM | POA: Diagnosis not present

## 2019-06-23 DIAGNOSIS — F4312 Post-traumatic stress disorder, chronic: Secondary | ICD-10-CM | POA: Diagnosis not present

## 2019-06-23 DIAGNOSIS — F1220 Cannabis dependence, uncomplicated: Secondary | ICD-10-CM | POA: Diagnosis not present

## 2019-06-23 DIAGNOSIS — F411 Generalized anxiety disorder: Secondary | ICD-10-CM | POA: Diagnosis not present

## 2019-06-24 DIAGNOSIS — F1220 Cannabis dependence, uncomplicated: Secondary | ICD-10-CM | POA: Diagnosis not present

## 2019-06-24 DIAGNOSIS — F411 Generalized anxiety disorder: Secondary | ICD-10-CM | POA: Diagnosis not present

## 2019-06-24 DIAGNOSIS — F331 Major depressive disorder, recurrent, moderate: Secondary | ICD-10-CM | POA: Diagnosis not present

## 2019-06-24 DIAGNOSIS — F4312 Post-traumatic stress disorder, chronic: Secondary | ICD-10-CM | POA: Diagnosis not present

## 2019-06-25 DIAGNOSIS — F411 Generalized anxiety disorder: Secondary | ICD-10-CM | POA: Diagnosis not present

## 2019-07-01 DIAGNOSIS — F411 Generalized anxiety disorder: Secondary | ICD-10-CM | POA: Diagnosis not present

## 2019-07-05 DIAGNOSIS — F411 Generalized anxiety disorder: Secondary | ICD-10-CM | POA: Diagnosis not present

## 2019-07-15 DIAGNOSIS — F411 Generalized anxiety disorder: Secondary | ICD-10-CM | POA: Diagnosis not present

## 2019-07-21 DIAGNOSIS — F411 Generalized anxiety disorder: Secondary | ICD-10-CM | POA: Diagnosis not present

## 2019-07-29 DIAGNOSIS — F411 Generalized anxiety disorder: Secondary | ICD-10-CM | POA: Diagnosis not present

## 2019-08-05 DIAGNOSIS — F411 Generalized anxiety disorder: Secondary | ICD-10-CM | POA: Diagnosis not present

## 2019-08-12 DIAGNOSIS — F411 Generalized anxiety disorder: Secondary | ICD-10-CM | POA: Diagnosis not present

## 2019-08-19 DIAGNOSIS — F411 Generalized anxiety disorder: Secondary | ICD-10-CM | POA: Diagnosis not present

## 2019-08-25 DIAGNOSIS — F411 Generalized anxiety disorder: Secondary | ICD-10-CM | POA: Diagnosis not present

## 2019-09-02 DIAGNOSIS — F411 Generalized anxiety disorder: Secondary | ICD-10-CM | POA: Diagnosis not present

## 2019-09-08 DIAGNOSIS — F411 Generalized anxiety disorder: Secondary | ICD-10-CM | POA: Diagnosis not present

## 2019-09-16 DIAGNOSIS — F411 Generalized anxiety disorder: Secondary | ICD-10-CM | POA: Diagnosis not present

## 2019-09-23 DIAGNOSIS — F411 Generalized anxiety disorder: Secondary | ICD-10-CM | POA: Diagnosis not present

## 2019-09-27 DIAGNOSIS — Z20828 Contact with and (suspected) exposure to other viral communicable diseases: Secondary | ICD-10-CM | POA: Diagnosis not present

## 2019-09-27 DIAGNOSIS — R509 Fever, unspecified: Secondary | ICD-10-CM | POA: Diagnosis not present

## 2019-09-30 DIAGNOSIS — F411 Generalized anxiety disorder: Secondary | ICD-10-CM | POA: Diagnosis not present

## 2019-10-07 DIAGNOSIS — F411 Generalized anxiety disorder: Secondary | ICD-10-CM | POA: Diagnosis not present

## 2019-10-21 DIAGNOSIS — F411 Generalized anxiety disorder: Secondary | ICD-10-CM | POA: Diagnosis not present

## 2019-10-28 DIAGNOSIS — F411 Generalized anxiety disorder: Secondary | ICD-10-CM | POA: Diagnosis not present

## 2019-11-11 DIAGNOSIS — F411 Generalized anxiety disorder: Secondary | ICD-10-CM | POA: Diagnosis not present

## 2019-11-25 DIAGNOSIS — F411 Generalized anxiety disorder: Secondary | ICD-10-CM | POA: Diagnosis not present

## 2019-11-30 DIAGNOSIS — F1210 Cannabis abuse, uncomplicated: Secondary | ICD-10-CM | POA: Diagnosis not present

## 2019-11-30 DIAGNOSIS — F902 Attention-deficit hyperactivity disorder, combined type: Secondary | ICD-10-CM | POA: Diagnosis not present

## 2019-11-30 DIAGNOSIS — F4310 Post-traumatic stress disorder, unspecified: Secondary | ICD-10-CM | POA: Diagnosis not present

## 2019-11-30 DIAGNOSIS — F3181 Bipolar II disorder: Secondary | ICD-10-CM | POA: Diagnosis not present

## 2019-12-02 DIAGNOSIS — F411 Generalized anxiety disorder: Secondary | ICD-10-CM | POA: Diagnosis not present

## 2019-12-09 DIAGNOSIS — F411 Generalized anxiety disorder: Secondary | ICD-10-CM | POA: Diagnosis not present

## 2019-12-23 DIAGNOSIS — F439 Reaction to severe stress, unspecified: Secondary | ICD-10-CM | POA: Diagnosis not present

## 2020-01-06 DIAGNOSIS — F439 Reaction to severe stress, unspecified: Secondary | ICD-10-CM | POA: Diagnosis not present

## 2020-01-20 DIAGNOSIS — F439 Reaction to severe stress, unspecified: Secondary | ICD-10-CM | POA: Diagnosis not present

## 2020-02-03 DIAGNOSIS — F439 Reaction to severe stress, unspecified: Secondary | ICD-10-CM | POA: Diagnosis not present

## 2020-02-17 DIAGNOSIS — F439 Reaction to severe stress, unspecified: Secondary | ICD-10-CM | POA: Diagnosis not present

## 2020-02-22 DIAGNOSIS — F4310 Post-traumatic stress disorder, unspecified: Secondary | ICD-10-CM | POA: Diagnosis not present

## 2020-02-22 DIAGNOSIS — F902 Attention-deficit hyperactivity disorder, combined type: Secondary | ICD-10-CM | POA: Diagnosis not present

## 2020-02-22 DIAGNOSIS — F3181 Bipolar II disorder: Secondary | ICD-10-CM | POA: Diagnosis not present

## 2020-02-22 DIAGNOSIS — F1010 Alcohol abuse, uncomplicated: Secondary | ICD-10-CM | POA: Diagnosis not present

## 2020-02-25 DIAGNOSIS — K529 Noninfective gastroenteritis and colitis, unspecified: Secondary | ICD-10-CM | POA: Diagnosis not present

## 2020-03-02 DIAGNOSIS — F439 Reaction to severe stress, unspecified: Secondary | ICD-10-CM | POA: Diagnosis not present

## 2020-03-23 DIAGNOSIS — F439 Reaction to severe stress, unspecified: Secondary | ICD-10-CM | POA: Diagnosis not present

## 2020-03-30 DIAGNOSIS — F439 Reaction to severe stress, unspecified: Secondary | ICD-10-CM | POA: Diagnosis not present

## 2022-01-15 IMAGING — MR MR HEAD WO/W CM
15 of 17 series · 42 of 48 positions shown · IV contrast (gadavist)
Comparison: None.

CLINICAL DATA: Drug overdose. Headache, dizziness and nausea.
Seizure.

EXAM:
MRI HEAD WITHOUT AND WITH CONTRAST
MRV HEAD WITHOUT CONTRAST
TECHNIQUE: Multiplanar, multiecho pulse sequences of the brain and surrounding
structures were obtained without and with intravenous contrast.
Angiographic images of the intracranial venous structures were
obtained using MRV technique without intravenous contrast.
CONTRAST:  5mL GADAVIST GADOBUTROL 1 MMOL/ML IV SOLN

[Series 9: DWI · axial · 3.0mm · 0.88mm/px · z∈[-92,+57]mm · 4 of 102 slices shown (1 of 4)]
[im 1/102]
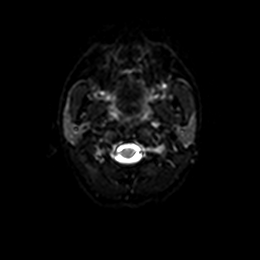
[im 34/102]
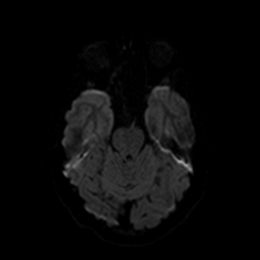
[im 68/102]
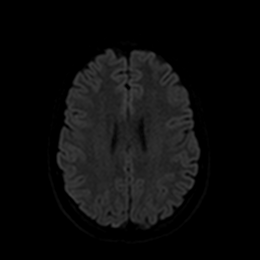
[im 102/102]
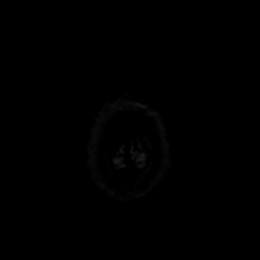

[Series 10: DWI · axial · 3.0mm · 0.88mm/px · z∈[-92,+57]mm · 2 of 50 slices shown (2 of 4)]
[im 1/50]
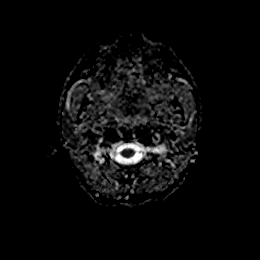
[im 50/50]
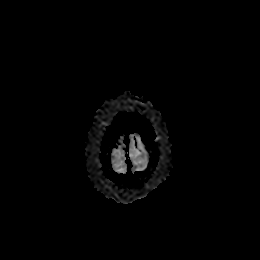

[Series 11: DWI · coronal · 4.0mm · 0.88mm/px · 3 of 70 slices shown (3 of 4)]
[im 1/70]
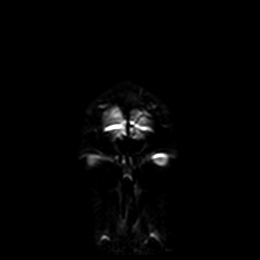
[im 35/70]
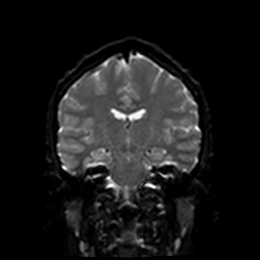
[im 70/70]
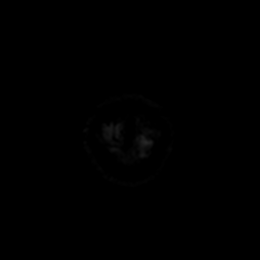

[Series 12: DWI · coronal · 4.0mm · 0.88mm/px · 2 of 35 slices shown (4 of 4)]
[im 1/35]
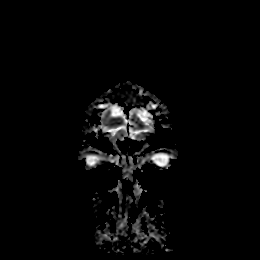
[im 35/35]
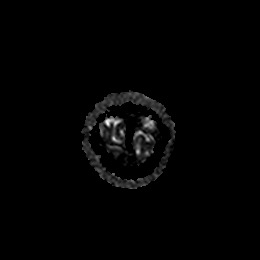

[Series 13: tof_fl2d_paracor · coronal · 2.0mm · 0.98mm/px · 6 of 146 slices shown]
[im 1/146]
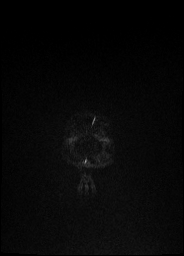
[im 30/146]
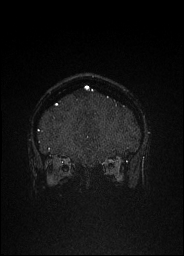
[im 59/146]
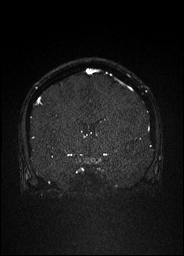
[im 88/146]
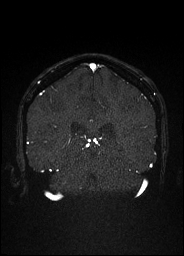
[im 117/146]
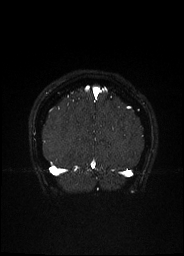
[im 146/146]
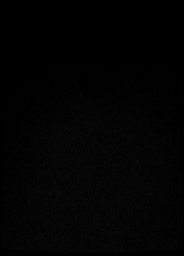

[Series 17: venous inhance coronal · coronal · portal-venous · 0.9mm · 0.57mm/px · 9 of 208 slices shown]
[im 1/208]
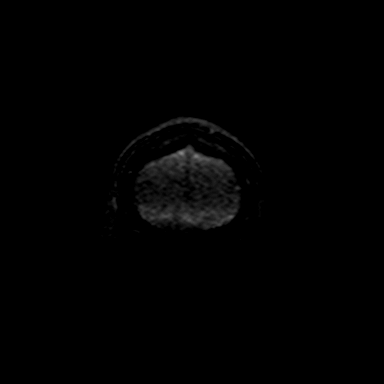
[im 26/208]
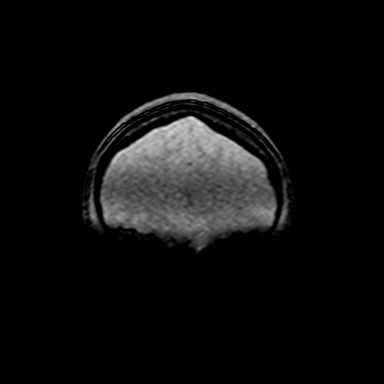
[im 52/208]
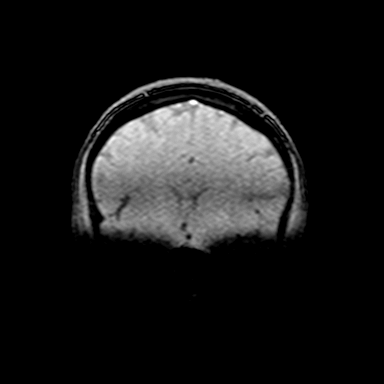
[im 78/208]
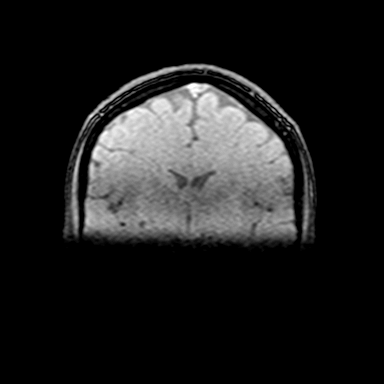
[im 104/208]
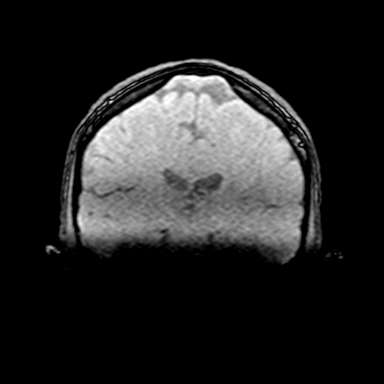
[im 130/208]
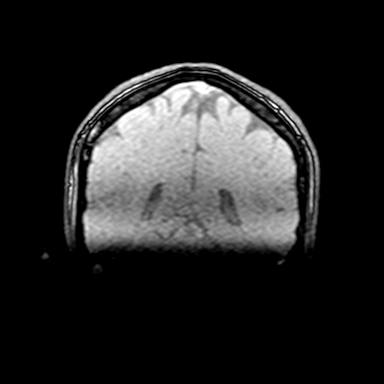
[im 156/208]
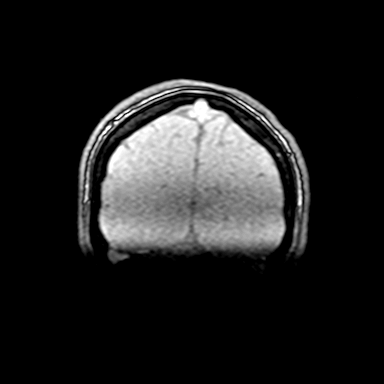
[im 182/208]
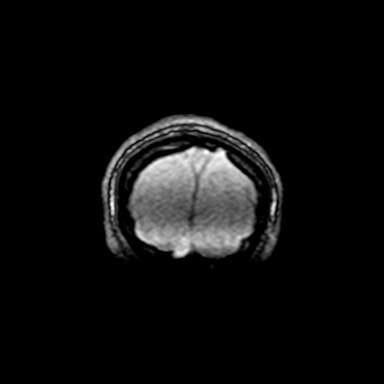
[im 208/208]
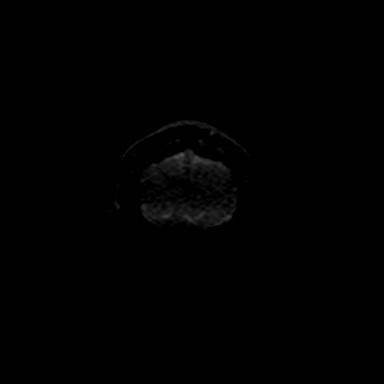

[Series 21: T1 · sagittal · 5.0mm · 0.75mm/px · 1 of 23 slices shown]
[im 1/23]
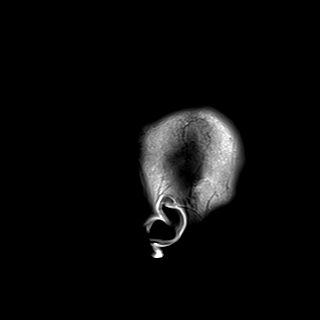

[Series 22: T2 · axial · 5.0mm · 0.72mm/px · 1 of 25 slices shown]
[im 1/25]
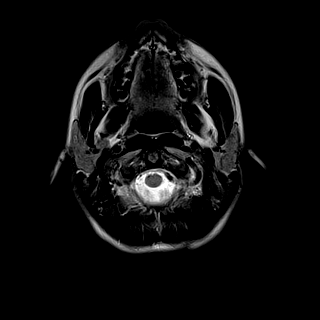

[Series 23: FLAIR · axial · 5.0mm · 0.45mm/px · 1 of 25 slices shown]
[im 1/25]
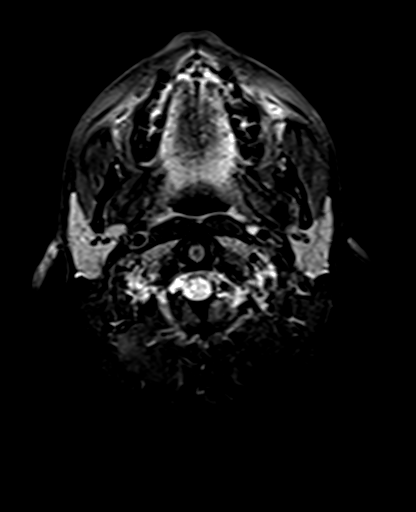

[Series 24: mag_images · axial · 3.0mm · 0.90mm/px · z∈[-100,+76]mm · 3 of 60 slices shown]
[im 1/60]
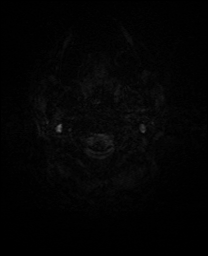
[im 30/60]
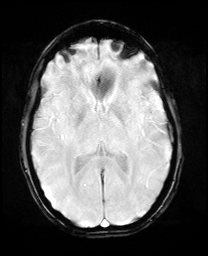
[im 60/60]
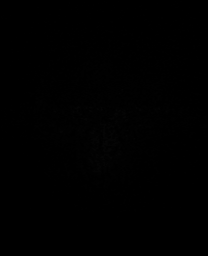

[Series 25: pha_images · axial · 3.0mm · 0.90mm/px · z∈[-100,+76]mm · 3 of 60 slices shown]
[im 1/60]
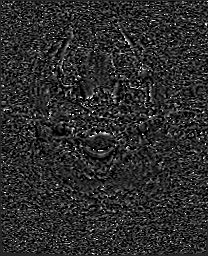
[im 30/60]
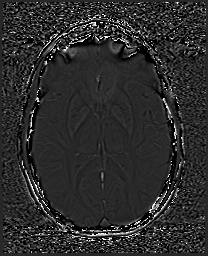
[im 60/60]
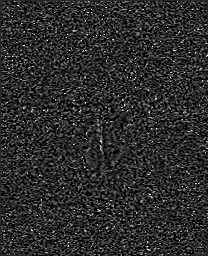

[Series 26: swi_images · axial · 3.0mm · 0.90mm/px · z∈[-100,+76]mm · 3 of 60 slices shown]
[im 1/60]
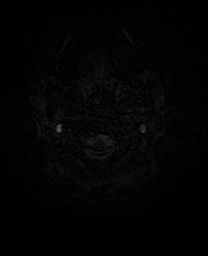
[im 30/60]
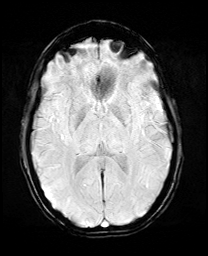
[im 60/60]
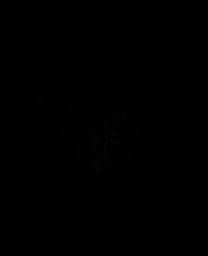

[Series 27: mip_images(sw) · axial · 24.0mm · 0.90mm/px · z∈[-90,+66]mm · 2 of 53 slices shown]
[im 1/53]
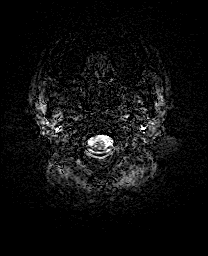
[im 53/53]
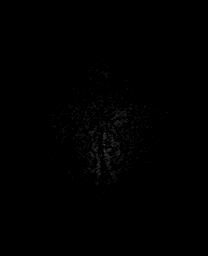

[Series 29: T2 post-contrast · coronal · 5.0mm · 0.72mm/px · 1 of 30 slices shown]
[im 1/30]
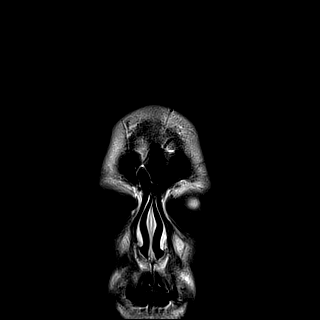

[Series 31: T1 post-contrast · coronal · 5.0mm · 0.34mm/px · 1 of 30 slices shown]
[im 1/30]
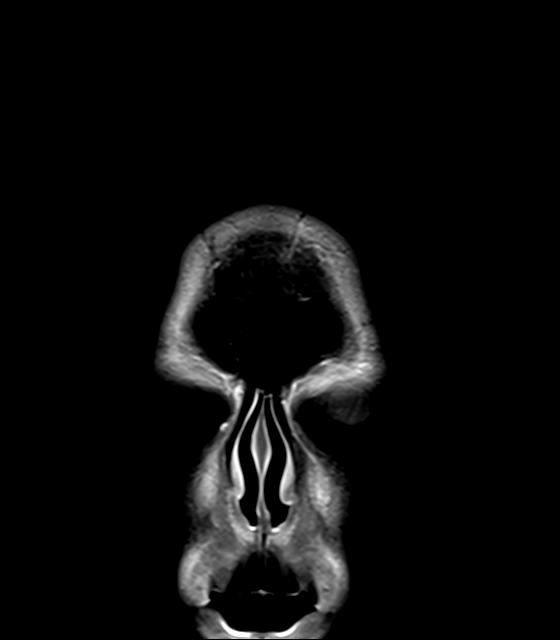

[42 of 48 positions shown; findings below may reference images not displayed]

FINDINGS: MRI brain findings

BRAIN: No acute infarct, acute hemorrhage or extra-axial collection.
Normal white matter signal for age. Normal volume of brain
parenchyma and CSF spaces. Midline structures are normal. There is
no abnormal contrast enhancement.

VASCULAR: Major flow voids are preserved. Susceptibility-sensitive
sequences show no chronic microhemorrhage or superficial siderosis.

SKULL AND UPPER CERVICAL SPINE: Normal calvarium and skull base.
Visualized upper cervical spine and soft tissues are normal.

SINUSES/ORBITS: No fluid levels or advanced mucosal thickening. No
mastoid or middle ear effusion. Normal orbits.

MRV brain findings

Superior sagittal sinus: Normal.

Straight sinus: Normal.

Inferior sagittal sinus, vein of Amauris and internal cerebral veins:
Normal.

Transverse sinuses: Normal.

Sigmoid sinuses: Normal.

Visualized jugular veins: Normal.
IMPRESSION: Normal MRI and MRV of the brain.
